# Patient Record
Sex: Female | Born: 1970 | Race: Black or African American | Hispanic: No | Smoking: Never smoker
Health system: Southern US, Community
[De-identification: ages and names within clinical notes are randomized; demographics above are authoritative.]

## PROBLEM LIST (undated history)

## (undated) DIAGNOSIS — I10 Essential (primary) hypertension: Secondary | ICD-10-CM

## (undated) DIAGNOSIS — M1990 Unspecified osteoarthritis, unspecified site: Secondary | ICD-10-CM

## (undated) DIAGNOSIS — L409 Psoriasis, unspecified: Secondary | ICD-10-CM

## (undated) DIAGNOSIS — E1140 Type 2 diabetes mellitus with diabetic neuropathy, unspecified: Secondary | ICD-10-CM

## (undated) DIAGNOSIS — M419 Scoliosis, unspecified: Secondary | ICD-10-CM

## (undated) DIAGNOSIS — L4050 Arthropathic psoriasis, unspecified: Secondary | ICD-10-CM

## (undated) DIAGNOSIS — M5430 Sciatica, unspecified side: Secondary | ICD-10-CM

## (undated) HISTORY — DX: Arthropathic psoriasis, unspecified: L40.50

## (undated) HISTORY — PX: TUBAL LIGATION: SHX77

## (undated) HISTORY — DX: Psoriasis, unspecified: L40.9

## (undated) HISTORY — DX: Sciatica, unspecified side: M54.30

---

## 1989-02-07 DIAGNOSIS — I10 Essential (primary) hypertension: Secondary | ICD-10-CM | POA: Insufficient documentation

## 1989-02-07 DIAGNOSIS — A6000 Herpesviral infection of urogenital system, unspecified: Secondary | ICD-10-CM | POA: Insufficient documentation

## 1989-02-07 DIAGNOSIS — E1142 Type 2 diabetes mellitus with diabetic polyneuropathy: Secondary | ICD-10-CM | POA: Insufficient documentation

## 1989-02-07 DIAGNOSIS — Z8619 Personal history of other infectious and parasitic diseases: Secondary | ICD-10-CM | POA: Insufficient documentation

## 1989-02-07 DIAGNOSIS — E785 Hyperlipidemia, unspecified: Secondary | ICD-10-CM | POA: Insufficient documentation

## 2006-02-07 DIAGNOSIS — H409 Unspecified glaucoma: Secondary | ICD-10-CM | POA: Insufficient documentation

## 2006-02-07 DIAGNOSIS — J309 Allergic rhinitis, unspecified: Secondary | ICD-10-CM | POA: Insufficient documentation

## 2007-04-23 ENCOUNTER — Emergency Department (HOSPITAL_COMMUNITY): Admission: EM | Admit: 2007-04-23 | Discharge: 2007-04-23 | Payer: Self-pay | Admitting: Emergency Medicine

## 2007-09-29 ENCOUNTER — Emergency Department (HOSPITAL_COMMUNITY): Admission: EM | Admit: 2007-09-29 | Discharge: 2007-09-29 | Payer: Self-pay | Admitting: Emergency Medicine

## 2008-03-16 ENCOUNTER — Emergency Department (HOSPITAL_COMMUNITY): Admission: EM | Admit: 2008-03-16 | Discharge: 2008-03-16 | Payer: Self-pay | Admitting: Emergency Medicine

## 2010-05-25 LAB — BASIC METABOLIC PANEL
Chloride: 100 mEq/L (ref 96–112)
GFR calc Af Amer: >60 mL/min (ref >60)
GFR calc non Af Amer: >60 mL/min (ref >60)
Potassium: 3.8 mEq/L (ref 3.5–5.1)
Sodium: 135 mEq/L (ref 135–145)

## 2010-05-25 LAB — GLUCOSE, CAPILLARY: Glucose-Capillary: 263 mg/dL — ABNORMAL HIGH (ref 70–99)

## 2010-12-06 ENCOUNTER — Emergency Department (HOSPITAL_COMMUNITY)
Admission: EM | Admit: 2010-12-06 | Discharge: 2010-12-06 | Disposition: A | Discharge status: Home / Self Care | Payer: Medicaid Other | Attending: Emergency Medicine | Admitting: Emergency Medicine

## 2010-12-06 DIAGNOSIS — E119 Type 2 diabetes mellitus without complications: Secondary | ICD-10-CM | POA: Insufficient documentation

## 2010-12-06 DIAGNOSIS — L02219 Cutaneous abscess of trunk, unspecified: Secondary | ICD-10-CM | POA: Insufficient documentation

## 2010-12-06 DIAGNOSIS — I10 Essential (primary) hypertension: Secondary | ICD-10-CM | POA: Insufficient documentation

## 2010-12-06 LAB — GLUCOSE, CAPILLARY: Glucose-Capillary: 435 mg/dL — ABNORMAL HIGH (ref 70–99)

## 2010-12-08 ENCOUNTER — Emergency Department (HOSPITAL_COMMUNITY)
Admission: EM | Admit: 2010-12-08 | Discharge: 2010-12-08 | Disposition: A | Discharge status: Home / Self Care | Payer: Medicaid Other | Attending: Emergency Medicine | Admitting: Emergency Medicine

## 2010-12-08 DIAGNOSIS — I2510 Atherosclerotic heart disease of native coronary artery without angina pectoris: Secondary | ICD-10-CM | POA: Insufficient documentation

## 2010-12-08 DIAGNOSIS — L02219 Cutaneous abscess of trunk, unspecified: Secondary | ICD-10-CM | POA: Insufficient documentation

## 2010-12-08 DIAGNOSIS — I10 Essential (primary) hypertension: Secondary | ICD-10-CM | POA: Insufficient documentation

## 2010-12-08 DIAGNOSIS — Z794 Long term (current) use of insulin: Secondary | ICD-10-CM | POA: Insufficient documentation

## 2010-12-08 DIAGNOSIS — E119 Type 2 diabetes mellitus without complications: Secondary | ICD-10-CM | POA: Insufficient documentation

## 2011-01-08 ENCOUNTER — Emergency Department (INDEPENDENT_AMBULATORY_CARE_PROVIDER_SITE_OTHER)
Admission: EM | Admit: 2011-01-08 | Discharge: 2011-01-08 | Disposition: A | Discharge status: Home / Self Care | Payer: Medicaid Other | Source: Home / Self Care | Attending: Family Medicine | Admitting: Family Medicine

## 2011-01-08 ENCOUNTER — Encounter: Payer: Self-pay | Admitting: Emergency Medicine

## 2011-01-08 ENCOUNTER — Observation Stay (HOSPITAL_COMMUNITY)
Admission: EM | Admit: 2011-01-08 | Discharge: 2011-01-09 | Disposition: A | Discharge status: Home / Self Care | Payer: Medicaid Other | Attending: Emergency Medicine | Admitting: Emergency Medicine

## 2011-01-08 DIAGNOSIS — E119 Type 2 diabetes mellitus without complications: Secondary | ICD-10-CM | POA: Insufficient documentation

## 2011-01-08 DIAGNOSIS — L03119 Cellulitis of unspecified part of limb: Secondary | ICD-10-CM | POA: Insufficient documentation

## 2011-01-08 DIAGNOSIS — L02419 Cutaneous abscess of limb, unspecified: Principal | ICD-10-CM | POA: Insufficient documentation

## 2011-01-08 DIAGNOSIS — L0390 Cellulitis, unspecified: Secondary | ICD-10-CM

## 2011-01-08 DIAGNOSIS — Z794 Long term (current) use of insulin: Secondary | ICD-10-CM | POA: Insufficient documentation

## 2011-01-08 DIAGNOSIS — L0291 Cutaneous abscess, unspecified: Secondary | ICD-10-CM

## 2011-01-08 DIAGNOSIS — IMO0002 Reserved for concepts with insufficient information to code with codable children: Secondary | ICD-10-CM

## 2011-01-08 HISTORY — DX: Unspecified osteoarthritis, unspecified site: M19.90

## 2011-01-08 HISTORY — DX: Type 2 diabetes mellitus with diabetic neuropathy, unspecified: E11.40

## 2011-01-08 HISTORY — DX: Essential (primary) hypertension: I10

## 2011-01-08 LAB — CBC
Hemoglobin: 12.2 g/dL (ref 12.0–15.0)
MCHC: 33.1 g/dL (ref 30.0–36.0)
RBC: 4.58 MIL/uL (ref 3.87–5.11)
WBC: 12.8 10*3/uL — ABNORMAL HIGH (ref 4.0–10.5)

## 2011-01-08 LAB — DIFFERENTIAL
Basophils Relative: 0 % (ref 0–1)
Lymphocytes Relative: 22 % (ref 12–46)
Monocytes Relative: 7 % (ref 3–12)
Neutro Abs: 9 10*3/uL — ABNORMAL HIGH (ref 1.7–7.7)
Neutrophils Relative %: 70 % (ref 43–77)

## 2011-01-08 LAB — POCT I-STAT, CHEM 8
BUN: 18 mg/dL (ref 6–23)
Chloride: 99 mEq/L (ref 96–112)
Glucose, Bld: 340 mg/dL — ABNORMAL HIGH (ref 70–99)
HCT: 38 % (ref 36.0–46.0)
Potassium: 3.8 mEq/L (ref 3.5–5.1)

## 2011-01-08 MED ORDER — ONDANSETRON HCL 4 MG/2ML IJ SOLN
4.0000 mg | Freq: Once | INTRAMUSCULAR | Status: AC
  Administered 2011-01-08: 4 mg via INTRAVENOUS
  Filled 2011-01-08: qty 2

## 2011-01-08 MED ORDER — HYDROMORPHONE HCL PF 1 MG/ML IJ SOLN
1.0000 mg | Freq: Once | INTRAMUSCULAR | Status: AC
  Administered 2011-01-08: 1 mg via INTRAVENOUS
  Filled 2011-01-08: qty 1

## 2011-01-08 MED ORDER — ONDANSETRON HCL 4 MG/2ML IJ SOLN
4.0000 mg | Freq: Four times a day (QID) | INTRAMUSCULAR | Status: DC | PRN
  Administered 2011-01-09: 4 mg via INTRAVENOUS
  Filled 2011-01-08: qty 2

## 2011-01-08 MED ORDER — SODIUM CHLORIDE 0.9 % IV SOLN
Freq: Once | INTRAVENOUS | Status: AC
  Administered 2011-01-08: 22:00:00 via INTRAVENOUS

## 2011-01-08 MED ORDER — CLINDAMYCIN PHOSPHATE 300 MG/50ML IV SOLN
300.0000 mg | Freq: Three times a day (TID) | INTRAVENOUS | Status: DC
  Administered 2011-01-09 (×3): 300 mg via INTRAVENOUS
  Filled 2011-01-08 (×6): qty 50

## 2011-01-08 MED ORDER — SODIUM CHLORIDE 0.9 % IV SOLN
20.0000 mL | INTRAVENOUS | Status: DC
  Administered 2011-01-09: 1000 mL via INTRAVENOUS

## 2011-01-08 NOTE — ED Provider Notes (Addendum)
History     CSN: [id number] Arrival date & time: 01/08/2011  8:53 PM   First MD Initiated Contact with Patient 01/08/11 2056      No chief complaint on file.   (Consider location/radiation/quality/duration/timing/severity/associated sxs/prior treatment) HPI Comments: Alexandria Meyer is an insulin-dependent diabetic, who, reports Alexandria Meyer's had flulike syndrome for the past week, and  been in the bad, Alexandria Meyer did notice slight discomfort of her anterior upper right thigh 5 or 6 days ago, yesterday, developed a fluctuant central area that ruptured and has been draining purulent matter since, the pain radiates to her groin into her lateral hip, low-grade fever or chills.  Alexandria Meyer went to urgent care who sent her directly to the emergency room to have the abscess, assessed and cared for.  Alexandria Meyer has not tried any compresses, warm or cold to the area.  Has not contacted her primary care Dr. has not checked her blood sugars in this week.  Due to her being ill with the flu.   The history is provided by the patient.    Past Medical History  Diagnosis Date  . Diabetes mellitus   . Hypertension   . Osteoarthritis   . Neuropathy in diabetes     Past Surgical History  Procedure Date  . Cesarean section   . Tubal ligation     No family history on file.  History  Substance Use Topics  . Smoking status: Never Smoker   . Smokeless tobacco: Not on file  . Alcohol Use: No    OB History    Grav Para Term Preterm Abortions TAB SAB Ect Mult Living                  Review of Systems  Constitutional: Positive for fever, chills and activity change.  HENT: Negative.   Eyes: Negative.   Respiratory: Negative for cough and shortness of breath.   Cardiovascular: Negative.   Gastrointestinal: Negative.   Genitourinary: Negative for dysuria and frequency.  Musculoskeletal: Positive for myalgias.  Skin: Positive for wound.  Neurological: Negative.   Hematological: Negative.   Psychiatric/Behavioral: Negative.      Allergies  Penicillins  Home Medications   Current Outpatient Rx  Name Route Sig Dispense Refill  . CYCLOBENZAPRINE HCL PO Oral Take by mouth.      Marland Kitchen GABAPENTIN (PHN) PO Oral Take by mouth.      . INSULIN ASPART 100 UNIT/ML [place] SOLN Subcutaneous Inject into the skin 3 (three) times daily before meals. Sliding scale     . LANTUS SOLOSTAR [place] Subcutaneous Inject into the skin.      Marland Kitchen LISINOPRIL PO Oral Take by mouth.      . METOPROLOL TARTRATE PO Oral Take by mouth.      . TRIPLEX DM PO Oral Take by mouth.      Marland Kitchen PRAMLINTIDE ACETATE 600 MCG/ML [place] SOLN Subcutaneous Inject 60 mcg into the skin 3 (three) times daily before meals.      Marland Kitchen SIMVASTATIN 10 MG PO TABS Oral Take 10 mg by mouth at bedtime.        BP 126/83  Pulse 118  Temp(Src) 99.3 F (37.4 C) (Oral)  Resp 19  SpO2 97%  LMP 01/08/2011  Physical Exam  Constitutional: Alexandria Meyer is oriented to person, place, and time. Alexandria Meyer appears well-developed and well-nourished.  HENT:  Head: Normocephalic.  Eyes: EOM are normal.  Neck: Neck supple.  Cardiovascular: Regular rhythm.  Tachycardia present.   Pulmonary/Chest: Effort normal.  Abdominal: Soft.  Neurological: Alexandria Meyer is oriented to person, place, and time.  Skin: Skin is warm. Lesion noted. No rash noted. There is erythema.     Psychiatric: Alexandria Meyer has a normal mood and affect.    ED Course  INCISION AND DRAINAGE Date/Time: 01/09/2011 12:59 AM Performed by: Arman Filter Authorized by: Arman Filter Consent: Verbal consent obtained. Consent given by: patient Patient understanding: patient states understanding of the procedure being performed Site marked: the operative site was not marked Patient identity confirmed: verbally with patient Time out: Immediately prior to procedure a "time out" was called to verify the correct patient, procedure, equipment, support staff and site/side marked as required. Type: abscess Body area: lower extremity Anesthesia: local  infiltration Local anesthetic: lidocaine 1% with epinephrine Anesthetic total: 2 ml Patient sedated: no Scalpel size: 11 Complexity: simple Drainage: purulent Wound treatment: drain placed Packing material: 1/4 in iodoform gauze Patient tolerance: Patient tolerated the procedure well with no immediate complications.  INCISION AND DRAINAGE Date/Time: 01/09/2011 6:11 AM Performed by: Arman Filter Authorized by: Arman Filter Consent: Verbal consent obtained. Consent given by: patient Site marked: the operative site was not marked Patient identity confirmed: verbally with patient Time out: Immediately prior to procedure a "time out" was called to verify the correct patient, procedure, equipment, support staff and site/side marked as required. Body area: lower extremity Anesthesia: local infiltration Local anesthetic: lidocaine 1% with epinephrine Anesthetic total: 3 ml Patient sedated: no Scalpel size: 11 Needle gauge: 22 Incision type: single straight Complexity: simple Drainage: purulent Wound treatment: drain placed Packing material: 1/2 in iodoform gauze Patient tolerance: Patient tolerated the procedure well with no immediate complications.   (including critical care time)   Labs Reviewed  CBC  DIFFERENTIAL  I-STAT, CHEM 8  WOUND CULTURE   No results found.   No diagnosis found.    MDM  Will dilate this patient's CBC and electrolytes status particularly her glycemic levels, though I and D. abscess, provide pain control and antibiotics        Arman Filter, NP 01/09/11 0102  Arman Filter, NP 01/09/11 [phone]

## 2011-01-08 NOTE — ED Provider Notes (Signed)
History     CSN: [id number] Arrival date & time: 01/08/2011  6:35 PM   First MD Initiated Contact with Patient 01/08/11 1733      Chief Complaint  Patient presents with  . Abscess    (Consider location/radiation/quality/duration/timing/severity/associated sxs/prior treatment) HPI Comments: Wendell presents for evaluation of a painful abscess on her RIGHT thigh, over the last week. She reports a hx of these and a hx of poorly controlled diabetes.   Patient is a 40 y.o. female presenting with abscess. The history is provided by the patient.  Abscess  This is a new problem. The current episode started less than one week ago. The onset was sudden. The problem occurs rarely. The problem has been rapidly worsening. The abscess is present on the right upper leg. The problem is severe. The abscess is characterized by painfulness, burning, swelling and redness.    History reviewed. No pertinent past medical history.  History reviewed. No pertinent past surgical history.  No family history on file.  History  Substance Use Topics  . Smoking status: Not on file  . Smokeless tobacco: Not on file  . Alcohol Use: Not on file    OB History    Grav Para Term Preterm Abortions TAB SAB Ect Mult Living                  Review of Systems  Constitutional: Negative.   HENT: Negative.   Eyes: Negative.   Respiratory: Negative.   Cardiovascular: Negative.   Gastrointestinal: Negative.   Genitourinary: Negative.   Musculoskeletal: Negative.   Skin: Positive for color change and wound.  Neurological: Negative.     Allergies  Penicillins  Home Medications   Current Outpatient Rx  Name Route Sig Dispense Refill  . CYCLOBENZAPRINE HCL PO Oral Take by mouth.      . INSULIN ASPART 100 UNIT/ML [place] SOLN Subcutaneous Inject into the skin 3 (three) times daily before meals. Sliding scale     . LANTUS SOLOSTAR [place] Subcutaneous Inject into the skin.      Marland Kitchen LISINOPRIL PO Oral Take by mouth.       . METOPROLOL TARTRATE PO Oral Take by mouth.      . TRIPLEX DM PO Oral Take by mouth.      Marland Kitchen PRAMLINTIDE ACETATE 600 MCG/ML [place] SOLN Subcutaneous Inject 60 mcg into the skin 3 (three) times daily before meals.      Marland Kitchen SIMVASTATIN 10 MG PO TABS Oral Take 10 mg by mouth at bedtime.        There were no vitals taken for this visit.  Physical Exam  Nursing note and vitals reviewed. Constitutional: She is oriented to person, place, and time. She appears well-developed and well-nourished.  HENT:  Head: Normocephalic and atraumatic.  Eyes: EOM are normal.  Neck: Normal range of motion.  Neurological: She is alert and oriented to person, place, and time.  Skin: Skin is warm and dry. Lesion and rash noted. Rash is pustular.       ED Course  Procedures (including critical care time)  Labs Reviewed - No data to display No results found.   No diagnosis found.    MDM  Abscess, sent to ED        Richardo Priest, MD 01/08/11 1950

## 2011-01-08 NOTE — ED Notes (Signed)
Right thigh, anterior with large area that is red, warm to touch.  Very painful to touch.  Reports soreness into right lower abdomen.  Reports skin super sensitive to materials/clothing touching area.  Noticed 2 days ago.

## 2011-01-09 ENCOUNTER — Encounter (HOSPITAL_COMMUNITY): Payer: Self-pay

## 2011-01-09 LAB — GLUCOSE, CAPILLARY
Glucose-Capillary: 349 mg/dL — ABNORMAL HIGH (ref 70–99)
Glucose-Capillary: 337 mg/dL — ABNORMAL HIGH (ref 70–99)
Glucose-Capillary: 277 mg/dL — ABNORMAL HIGH (ref 70–99)

## 2011-01-09 MED ORDER — CLINDAMYCIN HCL 150 MG PO CAPS: 450.0000 mg | ORAL_CAPSULE | Freq: Three times a day (TID) | ORAL | Status: AC

## 2011-01-09 MED ORDER — ONDANSETRON HCL 4 MG/2ML IJ SOLN
4.0000 mg | Freq: Once | INTRAMUSCULAR | Status: AC
  Administered 2011-01-09: 4 mg via INTRAVENOUS
  Filled 2011-01-09: qty 2

## 2011-01-09 MED ORDER — INSULIN ASPART 100 UNIT/ML ~~LOC~~ SOLN
10.0000 [IU] | Freq: Once | SUBCUTANEOUS | Status: AC
  Administered 2011-01-09: 10 [IU] via INTRAVENOUS
  Filled 2011-01-09 (×2): qty 1

## 2011-01-09 MED ORDER — HYDROMORPHONE HCL PF 1 MG/ML IJ SOLN
1.0000 mg | Freq: Once | INTRAMUSCULAR | Status: AC
  Administered 2011-01-09: 1 mg via INTRAVENOUS
  Filled 2011-01-09: qty 1

## 2011-01-09 MED ORDER — DIPHENHYDRAMINE HCL 50 MG/ML IJ SOLN
12.5000 mg | Freq: Once | INTRAMUSCULAR | Status: AC
  Administered 2011-01-09: 12.5 mg via INTRAVENOUS
  Filled 2011-01-09: qty 1

## 2011-01-09 MED ORDER — OXYCODONE-ACETAMINOPHEN 5-325 MG PO TABS: 1.0000 | ORAL_TABLET | Freq: Four times a day (QID) | ORAL | Status: AC | PRN

## 2011-01-09 MED ORDER — HYDROXYZINE HCL 25 MG PO TABS
25.0000 mg | ORAL_TABLET | Freq: Once | ORAL | Status: AC
  Administered 2011-01-09: 25 mg via ORAL
  Filled 2011-01-09: qty 1

## 2011-01-09 MED ORDER — OXYCODONE-ACETAMINOPHEN 5-325 MG PO TABS
2.0000 | ORAL_TABLET | Freq: Once | ORAL | Status: AC
  Administered 2011-01-09: 2 via ORAL
  Filled 2011-01-09: qty 2

## 2011-01-09 MED ORDER — HYDROMORPHONE HCL PF 1 MG/ML IJ SOLN
INTRAMUSCULAR | Status: AC
  Administered 2011-01-09: 1 mg via INTRAMUSCULAR
  Filled 2011-01-09: qty 1

## 2011-01-09 MED ORDER — DIPHENHYDRAMINE HCL 50 MG/ML IJ SOLN
INTRAMUSCULAR | Status: AC
  Filled 2011-01-09: qty 1

## 2011-01-09 MED ORDER — DIPHENHYDRAMINE HCL 50 MG/ML IJ SOLN
12.5000 mg | Freq: Once | INTRAMUSCULAR | Status: AC
  Administered 2011-01-09: 12.5 mg via INTRAVENOUS

## 2011-01-09 NOTE — ED Notes (Signed)
Pt visiting, laughing w/family.

## 2011-01-09 NOTE — ED Notes (Signed)
Medical screening examination/treatment/procedure(s) were conducted as a shared visit with non-physician practitioner(s) and myself.  I personally evaluated the patient during the encounter  R thigh erythema, warmth fluctuance.  Cellulitis of 9 cm with central abscess.   Plan overnight IV antibiotics and likely discharge in morning with bactrim/keflex.  Recheck 48 hours with PCP.  Glynn Octave, MD 01/09/11 [phone]

## 2011-01-09 NOTE — ED Provider Notes (Signed)
4:30 PM Patient's care in the CDU has been assumed by myself. She is currently receiving a dose of IV clindamycin for right upper thigh cellulitis secondary to an abscess. She also has poorly controlled diabetes. She is well appearing, pleasant and complains only of moderate pain at this time. I have examined the area of cellulitis and there is no extension of erythema or tenderness beyond the previously drawn borders. I will order an additional dose of pain medication with a plan to discharge the patient home after completion of her antibiotic. She has informed me that she will be establishing care with a primary Dr. tomorrow with a previously scheduled appointment at 3:30 PM. She and I have discussed that is very important for her to maintain this appointment for both a recheck of her wound and for development of a long-term plan for diabetes control.  7:05 PM Patient has completed her dose of clindamycin. There is still no extension of erythema beyond previously drawn margins. There is a small amount of purulent drainage from the I&D site; I have placed a new dressing over this. The patient does have a complaint of itching secondary to the Percocet; I have ordered a small dose of Benadryl to assist with this. The patient appears stable for discharge and is comfortable with a plan of outpatient antibiotics with primary care followup tomorrow and as needed beyond that point.   Elwyn Reach [address], [place] 01/09/11 1911

## 2011-01-09 NOTE — ED Provider Notes (Signed)
Medical screening examination/treatment/procedure(s) were conducted as a shared visit with non-physician practitioner(s) and myself.  I personally evaluated the patient during the encounter  Large area of cellulitis with central abscess.  I+D, IV antibiotics.  Glynn Octave, MD 01/09/11 [phone]

## 2011-01-09 NOTE — ED Provider Notes (Signed)
Medical screening examination/treatment/procedure(s) were conducted as a shared visit with non-physician practitioner(s) and myself.  I personally evaluated the patient during the encounter  Hx DM, R thigh abscess with surrounding cellulitis.  NV intact.  No fevers.  Glynn Octave, MD 01/09/11 1010

## 2011-01-09 NOTE — ED Notes (Signed)
Meal tray ordered from service response center

## 2011-01-09 NOTE — ED Notes (Signed)
0/9% NaCl scheduled for 2245 - duplicate order per Aura Camps, RN, night RN

## 2011-01-09 NOTE — ED Notes (Signed)
Called lab to inquire if wound culture received - advised no.

## 2011-01-10 NOTE — ED Provider Notes (Signed)
Medical screening examination/treatment/procedure(s) were performed by non-physician practitioner and as supervising physician I was immediately available for consultation/collaboration.   Glynn Octave, MD 01/10/11 1128

## 2011-05-06 ENCOUNTER — Other Ambulatory Visit: Payer: Self-pay | Admitting: Internal Medicine

## 2011-05-06 DIAGNOSIS — Z1231 Encounter for screening mammogram for malignant neoplasm of breast: Secondary | ICD-10-CM

## 2011-05-19 ENCOUNTER — Ambulatory Visit: Payer: Medicaid Other

## 2011-09-04 ENCOUNTER — Emergency Department (HOSPITAL_COMMUNITY)
Admission: EM | Admit: 2011-09-04 | Discharge: 2011-09-04 | Disposition: A | Discharge status: Home / Self Care | Payer: Medicaid Other | Attending: Emergency Medicine | Admitting: Emergency Medicine

## 2011-09-04 ENCOUNTER — Encounter (HOSPITAL_COMMUNITY): Payer: Self-pay | Admitting: Emergency Medicine

## 2011-09-04 ENCOUNTER — Emergency Department (HOSPITAL_COMMUNITY)
Admission: EM | Admit: 2011-09-04 | Discharge: 2011-09-04 | Disposition: A | Discharge status: Nursing Home (Includes ICF) | Payer: Medicaid Other | Source: Home / Self Care | Attending: Emergency Medicine | Admitting: Emergency Medicine

## 2011-09-04 ENCOUNTER — Encounter (HOSPITAL_COMMUNITY): Payer: Self-pay | Admitting: *Deleted

## 2011-09-04 DIAGNOSIS — R109 Unspecified abdominal pain: Secondary | ICD-10-CM

## 2011-09-04 DIAGNOSIS — S39012A Strain of muscle, fascia and tendon of lower back, initial encounter: Secondary | ICD-10-CM

## 2011-09-04 DIAGNOSIS — B9689 Other specified bacterial agents as the cause of diseases classified elsewhere: Secondary | ICD-10-CM | POA: Insufficient documentation

## 2011-09-04 DIAGNOSIS — M1990 Unspecified osteoarthritis, unspecified site: Secondary | ICD-10-CM | POA: Insufficient documentation

## 2011-09-04 DIAGNOSIS — X58XXXA Exposure to other specified factors, initial encounter: Secondary | ICD-10-CM | POA: Insufficient documentation

## 2011-09-04 DIAGNOSIS — I10 Essential (primary) hypertension: Secondary | ICD-10-CM | POA: Insufficient documentation

## 2011-09-04 DIAGNOSIS — Z79899 Other long term (current) drug therapy: Secondary | ICD-10-CM | POA: Insufficient documentation

## 2011-09-04 DIAGNOSIS — M549 Dorsalgia, unspecified: Secondary | ICD-10-CM

## 2011-09-04 DIAGNOSIS — Y93B9 Activity, other involving muscle strengthening exercises: Secondary | ICD-10-CM | POA: Insufficient documentation

## 2011-09-04 DIAGNOSIS — E119 Type 2 diabetes mellitus without complications: Secondary | ICD-10-CM | POA: Insufficient documentation

## 2011-09-04 DIAGNOSIS — IMO0002 Reserved for concepts with insufficient information to code with codable children: Secondary | ICD-10-CM | POA: Insufficient documentation

## 2011-09-04 DIAGNOSIS — Z794 Long term (current) use of insulin: Secondary | ICD-10-CM | POA: Insufficient documentation

## 2011-09-04 DIAGNOSIS — A499 Bacterial infection, unspecified: Secondary | ICD-10-CM | POA: Insufficient documentation

## 2011-09-04 DIAGNOSIS — Y998 Other external cause status: Secondary | ICD-10-CM | POA: Insufficient documentation

## 2011-09-04 DIAGNOSIS — N760 Acute vaginitis: Secondary | ICD-10-CM | POA: Insufficient documentation

## 2011-09-04 LAB — COMPREHENSIVE METABOLIC PANEL
AST: 14 U/L (ref 0–37)
BUN: 18 mg/dL (ref 6–23)
CO2: 28 mEq/L (ref 19–32)
Calcium: 9.7 mg/dL (ref 8.4–10.5)
Creatinine, Ser: 0.61 mg/dL (ref 0.50–1.10)
GFR calc Af Amer: >90 mL/min (ref >90)
GFR calc non Af Amer: >90 mL/min (ref >90)
Glucose, Bld: 274 mg/dL — ABNORMAL HIGH (ref 70–99)

## 2011-09-04 LAB — WET PREP, GENITAL: Clue Cells Wet Prep HPF POC: NONE SEEN

## 2011-09-04 LAB — URINE MICROSCOPIC-ADD ON

## 2011-09-04 LAB — POCT URINALYSIS DIP (DEVICE)
Ketones, ur: NEGATIVE mg/dL
Leukocytes, UA: NEGATIVE
Nitrite: NEGATIVE
Protein, ur: NEGATIVE mg/dL
pH: 6 (ref 5.0–8.0)

## 2011-09-04 LAB — CBC
HCT: 38.9 % (ref 36.0–46.0)
MCH: 26.7 pg (ref 26.0–34.0)
MCV: 81.7 fL (ref 78.0–100.0)
Platelets: 232 10*3/uL (ref 150–400)
RBC: 4.76 MIL/uL (ref 3.87–5.11)

## 2011-09-04 LAB — URINALYSIS, ROUTINE W REFLEX MICROSCOPIC
Bilirubin Urine: NEGATIVE
Hgb urine dipstick: NEGATIVE
Specific Gravity, Urine: 1.028 (ref 1.005–1.030)
Urobilinogen, UA: 0.2 mg/dL (ref 0.0–1.0)

## 2011-09-04 MED ORDER — SODIUM CHLORIDE 0.9 % IV BOLUS (SEPSIS)
500.0000 mL | Freq: Once | INTRAVENOUS | Status: AC
  Administered 2011-09-04: 500 mL via INTRAVENOUS

## 2011-09-04 MED ORDER — OXYCODONE-ACETAMINOPHEN 5-325 MG PO TABS: 1.0000 | ORAL_TABLET | Freq: Four times a day (QID) | ORAL | Status: AC | PRN

## 2011-09-04 MED ORDER — CYCLOBENZAPRINE HCL 10 MG PO TABS: 10.0000 mg | ORAL_TABLET | Freq: Two times a day (BID) | ORAL | Status: AC | PRN

## 2011-09-04 MED ORDER — METRONIDAZOLE 500 MG PO TABS: 500.0000 mg | ORAL_TABLET | Freq: Two times a day (BID) | ORAL | Status: AC

## 2011-09-04 MED ORDER — MORPHINE SULFATE 4 MG/ML IJ SOLN
4.0000 mg | Freq: Once | INTRAMUSCULAR | Status: AC
  Administered 2011-09-04: 4 mg via INTRAVENOUS
  Filled 2011-09-04: qty 1

## 2011-09-04 MED ORDER — DIPHENHYDRAMINE HCL 50 MG/ML IJ SOLN
25.0000 mg | Freq: Once | INTRAMUSCULAR | Status: AC
  Administered 2011-09-04: 25 mg via INTRAVENOUS
  Filled 2011-09-04: qty 1

## 2011-09-04 MED ORDER — ONDANSETRON HCL 4 MG/2ML IJ SOLN
4.0000 mg | Freq: Once | INTRAMUSCULAR | Status: AC
  Administered 2011-09-04: 4 mg via INTRAVENOUS
  Filled 2011-09-04: qty 2

## 2011-09-04 MED ORDER — HYDROMORPHONE HCL PF 1 MG/ML IJ SOLN
1.0000 mg | Freq: Once | INTRAMUSCULAR | Status: AC
  Administered 2011-09-04: 1 mg via INTRAVENOUS
  Filled 2011-09-04: qty 1

## 2011-09-04 NOTE — ED Provider Notes (Signed)
History     CSN: [id number]  Arrival date & time 09/04/11  1355   First MD Initiated Contact with Patient 09/04/11 1718      Chief Complaint  Patient presents with  . Abdominal Pain  . Back Pain    (Consider location/radiation/quality/duration/timing/severity/associated sxs/prior treatment) HPI Patient to ER complaining of severe back pain and lower abdominal pain. She described that Wednesday after she was exercising she walked and then perform some exercises at home she heard a loud pop coming from the right side of her back but feels this is a deep pain. She also describes that the pain radiates to worse her abdomen . Patient denies any fevers, nausea or vomiting. Or any falls, patient denies any urinary symptoms but does describe a clear vaginal discharge for several days This is a new problem. The current episode started more than 2 days ago. The problem occurs constantly. The problem has been gradually worsening. The quality of the pain is described as shooting and aching. The pain is at a severity of 8/10. The pain is moderate. The symptoms are aggravated by bending, twisting and certain positions. Associated symptoms include abdominal pain. Pertinent negatives include no fever and no dysuria.    The patient informs me that she was treated for PID 3 weeks ago and that she had not been exercising in years and last Wednesday was her first time in a long time and it was quite strenuous.   Past Medical History  Diagnosis Date  . Diabetes mellitus   . Hypertension   . Osteoarthritis   . Neuropathy in diabetes     Past Surgical History  Procedure Date  . Cesarean section   . Tubal ligation     History reviewed. No pertinent family history.  History  Substance Use Topics  . Smoking status: Never Smoker   . Smokeless tobacco: Not on file  . Alcohol Use: No    OB History    Grav Para Term Preterm Abortions TAB SAB Ect Mult Living                  Review of  Systems   HEENT: denies blurry vision or change in hearing PULMONARY: Denies difficulty breathing and SOB CARDIAC: denies chest pain or heart palpitations MUSCULOSKELETAL:  denies being unable to ambulate GU: denies loss of bowel or urinary control NEURO: denies numbness and tingling in extremities SKIN: no new rashes PSYCH: patient denies anxiety or depression. NECK: Pt denies having neck pain     Allergies  Penicillins  Home Medications   Current Outpatient Rx  Name Route Sig Dispense Refill  . ACYCLOVIR 400 MG PO TABS Oral Take 400 mg by mouth daily.      . CHOLINE FENOFIBRATE 135 MG PO CPDR Oral Take 135 mg by mouth daily.    . CYCLOBENZAPRINE HCL 10 MG PO TABS Oral Take 10 mg by mouth at bedtime.     Marland Kitchen GABAPENTIN 300 MG PO CAPS Oral Take 300 mg by mouth 3 (three) times daily.      . INSULIN ASPART 100 UNIT/ML [place] SOLN Subcutaneous Inject 15 Units into the skin 3 (three) times daily before meals. Sliding scale    . LANTUS SOLOSTAR [place] Subcutaneous Inject 60 Units into the skin 2 (two) times daily.     Marland Kitchen LISINOPRIL-HYDROCHLOROTHIAZIDE 10-12.5 MG PO TABS Oral Take 1 tablet by mouth daily.      Marland Kitchen METOPROLOL TARTRATE 25 MG PO TABS Oral Take 25 mg by  mouth daily.      Marland Kitchen SIMVASTATIN 40 MG PO TABS Oral Take 40 mg by mouth daily.        BP 108/55  Pulse 94  Temp 98.3 F (36.8 C) (Oral)  Resp 18  SpO2 99%  LMP 08/08/2011  Physical Exam  Nursing note and vitals reviewed. Constitutional: She appears well-developed and well-nourished. No distress.  HENT:  Head: Normocephalic and atraumatic.  Eyes: Pupils are equal, round, and reactive to light.  Neck: Normal range of motion. Neck supple.  Cardiovascular: Normal rate and regular rhythm.   Pulmonary/Chest: Effort normal.  Abdominal: Soft. There is tenderness in the suprapubic area. There is no rigidity, no rebound, no guarding, no tenderness at McBurney's point and negative Murphy's sign.  Genitourinary: Uterus normal. Cervix  exhibits discharge. Cervix exhibits no motion tenderness and no friability. Right adnexum displays mass and tenderness. Right adnexum displays no fullness. Left adnexum displays tenderness. Left adnexum displays no fullness. There is tenderness around the vagina. Vaginal discharge found.  Musculoskeletal:       Back:        Equal strength to bilateral lower extremities. Neurosensory  function adequate to both legs. Skin color is normal. Skin is warm and moist. I see no step off deformity, no bony tenderness. Pt is able to ambulate without limp. Pain is relieved when sitting in certain positions. ROM is decreased due to pain. No crepitus, laceration, effusion, swelling.  Pulses are normal   Neurological: She is alert.  Skin: Skin is warm and dry.    ED Course  Procedures (including critical care time)  Labs Reviewed  URINALYSIS, ROUTINE W REFLEX MICROSCOPIC - Abnormal; Notable for the following:    Glucose, UA >1000 (*)     All other components within normal limits  COMPREHENSIVE METABOLIC PANEL - Abnormal; Notable for the following:    Glucose, Bld 274 (*)     Total Bilirubin 0.2 (*)     All other components within normal limits  URINE MICROSCOPIC-ADD ON - Abnormal; Notable for the following:    Bacteria, UA FEW (*)     All other components within normal limits  WET PREP, GENITAL - Abnormal; Notable for the following:    Yeast Wet Prep HPF POC FEW (*)     WBC, Wet Prep HPF POC MODERATE (*)     All other components within normal limits  CBC  LIPASE, BLOOD  GC/CHLAMYDIA PROBE AMP, GENITAL   No results found.   1. Back strain   2. Bacterial vaginosis       MDM  pts back pain most likely musculoskeletal. It responded well to pain medication. Pt labs also positive for bacterial vaginosis.   Due to patients complexity i have told her how important it is that she follow-up with her PCP tomorrow or Tuesday that latest.  Her abdomen prior to discharge was a non acute abdomen. Pt  given Rx for metronidazole, muscle relaxer's and pain medication.  Patient with back pain. No neurological deficits. Patient is ambulatory. No warning symptoms of back pain including: loss of bowel or bladder control, night sweats, waking from sleep with back pain, unexplained fevers or weight loss, h/o cancer, IVDU, recent trauma. No concern for cauda equina, epidural abscess, or other serious cause of back pain. Conservative measures such as rest, ice/heat and pain medicine indicated with PCP follow-up if no improvement with conservative management.         Dorthula Matas, PA 09/04/11 1941

## 2011-09-04 NOTE — ED Notes (Signed)
Pt sent here from [facility], having lower back pain that radiates to lower abd. Having vaginal discharge, denies any n/v/d.

## 2011-09-04 NOTE — ED Provider Notes (Signed)
History     CSN: [id number]  Arrival date & time 09/04/11  1140   First MD Initiated Contact with Patient 09/04/11 1204      Chief Complaint  Patient presents with  . Back Pain    (Consider location/radiation/quality/duration/timing/severity/associated sxs/prior treatment) HPI Comments: Patient presents to urgent care this afternoon complaining of severe back pain that radiates to her right abdomen. She described that Wednesday after she was exercising she walked and then perform some exercises at home she heard a loud pop coming from the right side of her back but feels this is a deep pain. She also describes that the pain radiates to worse her abdomen (points to bilateral lower abdomen and right upper quadrant regions). Patient denies any fevers, nausea or vomiting. Or any falls, patient denies any urinary symptoms but does describe a clear vaginal discharge for several days.  Patient is a 41 y.o. female presenting with back pain. The history is provided by the patient.  Back Pain  This is a new problem. The current episode started more than 2 days ago. The problem occurs constantly. The problem has been gradually worsening. The quality of the pain is described as shooting and aching. The pain is at a severity of 8/10. The pain is moderate. The symptoms are aggravated by bending, twisting and certain positions. Associated symptoms include abdominal pain. Pertinent negatives include no fever and no dysuria.    Past Medical History  Diagnosis Date  . Diabetes mellitus   . Hypertension   . Osteoarthritis   . Neuropathy in diabetes     Past Surgical History  Procedure Date  . Cesarean section   . Tubal ligation     History reviewed. No pertinent family history.  History  Substance Use Topics  . Smoking status: Never Smoker   . Smokeless tobacco: Not on file  . Alcohol Use: No    OB History    Grav Para Term Preterm Abortions TAB SAB Ect Mult Living                   Review of Systems  Constitutional: Positive for activity change. Negative for fever, chills, diaphoresis, appetite change and fatigue.  Gastrointestinal: Positive for abdominal pain. Negative for nausea, vomiting, diarrhea, constipation, blood in stool and rectal pain.  Genitourinary: Positive for vaginal discharge. Negative for dysuria, hematuria, vaginal pain and menstrual problem.  Musculoskeletal: Positive for back pain. Negative for joint swelling and gait problem.  Skin: Negative for rash and wound.    Allergies  Penicillins  Home Medications   Current Outpatient Rx  Name Route Sig Dispense Refill  . ACYCLOVIR 400 MG PO TABS Oral Take 400 mg by mouth daily.      . TRILIPIX PO Oral Take 135 mg by mouth daily.     . CYCLOBENZAPRINE HCL 10 MG PO TABS Oral Take 10 mg by mouth 3 (three) times daily as needed. As needed for muscle spasms.     Marland Kitchen GABAPENTIN 300 MG PO CAPS Oral Take 300 mg by mouth 3 (three) times daily.      . INSULIN ASPART 100 UNIT/ML [place] SOLN Subcutaneous Inject 6-22 Units into the skin 3 (three) times daily before meals. Sliding scale    . LANTUS SOLOSTAR [place] Subcutaneous Inject 50 Units into the skin at bedtime.     Marland Kitchen LISINOPRIL-HYDROCHLOROTHIAZIDE 10-12.5 MG PO TABS Oral Take 1 tablet by mouth daily.      Marland Kitchen METOPROLOL TARTRATE 25 MG PO TABS Oral  Take 25 mg by mouth daily.      Marland Kitchen PRAMLINTIDE ACETATE 600 MCG/ML [place] SOLN Subcutaneous Inject 120 mcg into the skin 2 (two) times daily.     Marland Kitchen SIMVASTATIN 40 MG PO TABS Oral Take 40 mg by mouth daily.        BP 116/78  Pulse 97  Temp 98.6 F (37 C) (Oral)  Resp 19  SpO2 98%  LMP 08/08/2011  Physical Exam  Nursing note and vitals reviewed. Constitutional: She appears well-developed. She appears distressed.  HENT:  Head: Atraumatic.  Eyes: Conjunctivae are normal.  Neck: Neck supple.  Abdominal: Soft. Bowel sounds are normal. There is no hepatosplenomegaly. There is tenderness in the right lower quadrant,  periumbilical area and left lower quadrant. There is rebound. There is no rigidity, no guarding and no CVA tenderness.    Skin:       ED Course  Procedures (including critical care time)  Labs Reviewed - No data to display No results found.   1. Abdominal pain   2. Back pain       MDM  Patient presents with some atypical symptoms for a lumbar sprain. Her abdominal exam was somewhat disproportionate and reactive with moderate to severe pain with mild palpation to both lower quadrants and right periumbilical and upper abdominal regions. I cannot rule out an intra-abdominal condition with such exam. Patient is hemodynamically stable, afebrile with no vomiting or nausea.        Jimmie Molly, MD 09/04/11 1312

## 2011-09-04 NOTE — ED Notes (Signed)
Pt having severe back and abdominal pain since last wed. She states her pain is a 10/10 and hurts worse with movement. She denies any known injury. Pt lower abdomen has extreme tenderness on palpation.

## 2011-09-05 LAB — GC/CHLAMYDIA PROBE AMP, GENITAL: Chlamydia, DNA Probe: NEGATIVE

## 2011-09-05 NOTE — ED Provider Notes (Signed)
Medical screening examination/treatment/procedure(s) were performed by non-physician practitioner and as supervising physician I was immediately available for consultation/collaboration.   Carleene Cooper III, MD 09/05/11 1350

## 2012-02-08 DIAGNOSIS — M5136 Other intervertebral disc degeneration, lumbar region: Secondary | ICD-10-CM | POA: Insufficient documentation

## 2012-02-08 DIAGNOSIS — M1990 Unspecified osteoarthritis, unspecified site: Secondary | ICD-10-CM | POA: Insufficient documentation

## 2012-02-08 HISTORY — PX: BACK SURGERY: SHX140

## 2013-04-25 DIAGNOSIS — E1149 Type 2 diabetes mellitus with other diabetic neurological complication: Secondary | ICD-10-CM | POA: Insufficient documentation

## 2013-11-08 DIAGNOSIS — G8929 Other chronic pain: Secondary | ICD-10-CM | POA: Insufficient documentation

## 2013-11-08 DIAGNOSIS — M545 Low back pain, unspecified: Secondary | ICD-10-CM | POA: Insufficient documentation

## 2015-02-08 DIAGNOSIS — J45909 Unspecified asthma, uncomplicated: Secondary | ICD-10-CM | POA: Insufficient documentation

## 2016-06-16 ENCOUNTER — Emergency Department (HOSPITAL_COMMUNITY): Payer: Medicaid Other

## 2016-06-16 ENCOUNTER — Encounter (HOSPITAL_COMMUNITY): Payer: Self-pay | Admitting: *Deleted

## 2016-06-16 ENCOUNTER — Emergency Department (HOSPITAL_COMMUNITY)
Admission: EM | Admit: 2016-06-16 | Discharge: 2016-06-16 | Disposition: A | Discharge status: Home / Self Care | Payer: Medicaid Other | Attending: Emergency Medicine | Admitting: Emergency Medicine

## 2016-06-16 DIAGNOSIS — R52 Pain, unspecified: Secondary | ICD-10-CM

## 2016-06-16 DIAGNOSIS — M5416 Radiculopathy, lumbar region: Secondary | ICD-10-CM | POA: Insufficient documentation

## 2016-06-16 DIAGNOSIS — I10 Essential (primary) hypertension: Secondary | ICD-10-CM | POA: Insufficient documentation

## 2016-06-16 DIAGNOSIS — Z794 Long term (current) use of insulin: Secondary | ICD-10-CM | POA: Diagnosis not present

## 2016-06-16 DIAGNOSIS — E119 Type 2 diabetes mellitus without complications: Secondary | ICD-10-CM | POA: Diagnosis not present

## 2016-06-16 DIAGNOSIS — R200 Anesthesia of skin: Secondary | ICD-10-CM | POA: Diagnosis present

## 2016-06-16 DIAGNOSIS — M5410 Radiculopathy, site unspecified: Secondary | ICD-10-CM

## 2016-06-16 DIAGNOSIS — Z79899 Other long term (current) drug therapy: Secondary | ICD-10-CM | POA: Diagnosis not present

## 2016-06-16 LAB — BASIC METABOLIC PANEL
Anion gap: 11 (ref 5–15)
BUN: 16 mg/dL (ref 6–20)
CO2: 25 mmol/L (ref 22–32)
Calcium: 9.3 mg/dL (ref 8.9–10.3)
Chloride: 99 mmol/L — ABNORMAL LOW (ref 101–111)
Creatinine, Ser: 0.61 mg/dL (ref 0.44–1.00)
GFR calc Af Amer: >60 mL/min (ref >60)
GLUCOSE: 206 mg/dL — AB (ref 65–99)
POTASSIUM: 4.4 mmol/L (ref 3.5–5.1)
Sodium: 135 mmol/L (ref 135–145)

## 2016-06-16 LAB — CBC WITH DIFFERENTIAL/PLATELET
Basophils Absolute: 0 10*3/uL (ref 0.0–0.1)
Basophils Relative: 0 %
EOS PCT: 3 %
Eosinophils Absolute: 0.2 10*3/uL (ref 0.0–0.7)
HCT: 46.9 % — ABNORMAL HIGH (ref 36.0–46.0)
Hemoglobin: 14.8 g/dL (ref 12.0–15.0)
LYMPHS ABS: 3.3 10*3/uL (ref 0.7–4.0)
LYMPHS PCT: 49 %
MCH: 26.9 pg (ref 26.0–34.0)
MCHC: 31.6 g/dL (ref 30.0–36.0)
MCV: 85.1 fL (ref 78.0–100.0)
MONOS PCT: 5 %
Monocytes Absolute: 0.4 10*3/uL (ref 0.1–1.0)
Neutro Abs: 3 10*3/uL (ref 1.7–7.7)
Neutrophils Relative %: 43 %
PLATELETS: 229 10*3/uL (ref 150–400)
RBC: 5.51 MIL/uL — AB (ref 3.87–5.11)
RDW: 13.4 % (ref 11.5–15.5)
WBC: 6.9 10*3/uL (ref 4.0–10.5)

## 2016-06-16 MED ORDER — GADOBENATE DIMEGLUMINE 529 MG/ML IV SOLN
20.0000 mL | Freq: Once | INTRAVENOUS | Status: AC | PRN
  Administered 2016-06-16: 20 mL via INTRAVENOUS

## 2016-06-16 MED ORDER — SODIUM CHLORIDE 0.9 % IV SOLN
INTRAVENOUS | Status: DC
  Administered 2016-06-16: 21:00:00 via INTRAVENOUS

## 2016-06-16 MED ORDER — OXYCODONE-ACETAMINOPHEN 5-325 MG PO TABS
1.0000 | ORAL_TABLET | Freq: Once | ORAL | Status: AC
  Administered 2016-06-16: 1 via ORAL
  Filled 2016-06-16: qty 1

## 2016-06-16 MED ORDER — LORAZEPAM 2 MG/ML IJ SOLN
1.0000 mg | Freq: Once | INTRAMUSCULAR | Status: AC
  Administered 2016-06-16: 1 mg via INTRAVENOUS
  Filled 2016-06-16: qty 1

## 2016-06-16 MED ORDER — HYDROMORPHONE HCL 1 MG/ML IJ SOLN
1.0000 mg | Freq: Once | INTRAMUSCULAR | Status: AC
  Administered 2016-06-16: 1 mg via INTRAVENOUS
  Filled 2016-06-16: qty 1

## 2016-06-16 MED ORDER — HYDROMORPHONE HCL 1 MG/ML IJ SOLN
1.0000 mg | Freq: Once | INTRAMUSCULAR | Status: DC
  Filled 2016-06-16: qty 1

## 2016-06-16 NOTE — ED Notes (Signed)
Patient transported to MRI 

## 2016-06-16 NOTE — ED Provider Notes (Signed)
[facility] DEPT Provider Note   CSN: [id number] Arrival date & time: 06/16/16  1323     History   Chief Complaint Chief Complaint  Patient presents with  . Numbness    HPI Alexandria Meyer is a 46 y.o. female.  46 year old female with history of chronic back pain present with worsening lower lumbar back pain times several months which is been worse today. She's had gradual loss of bowel and bladder function. Denies any new changes to her symptoms. Pain is characterized as sharp and worse with standing or moving. She has bilateral lower extremity paresthesias. Has been taking Neurontin as well as Flexeril without relief. Nothing makes her symptoms are markedly better.      Past Medical History:  Diagnosis Date  . Diabetes mellitus   . Hypertension   . Neuropathy in diabetes ([place])   . Osteoarthritis     There are no active problems to display for this patient.   Past Surgical History:  Procedure Laterality Date  . CESAREAN SECTION    . TUBAL LIGATION      OB History    No data available       Home Medications    Prior to Admission medications   Medication Sig Start Date End Date Taking? Authorizing Provider  acyclovir (ZOVIRAX) 400 MG tablet Take 400 mg by mouth daily.      [provider]  Choline Fenofibrate 135 MG capsule Take 135 mg by mouth daily.    [provider]  cyclobenzaprine (FLEXERIL) 10 MG tablet Take 10 mg by mouth at bedtime.     [provider]  gabapentin (NEURONTIN) 300 MG capsule Take 300 mg by mouth 3 (three) times daily.      [provider]  insulin aspart (NOVOLOG) 100 UNIT/ML injection Inject 15 Units into the skin 3 (three) times daily before meals. Sliding scale    [provider]  Insulin Glargine (LANTUS SOLOSTAR [place]) Inject 60 Units into the skin 2 (two) times daily.     [provider]  lisinopril-hydrochlorothiazide (PRINZIDE,ZESTORETIC) 10-12.5 MG per tablet Take 1 tablet  by mouth daily.      [provider]  metoprolol tartrate (LOPRESSOR) 25 MG tablet Take 25 mg by mouth daily.      [provider]  simvastatin (ZOCOR) 40 MG tablet Take 40 mg by mouth daily.      [provider]    Family History No family history on file.  Social History Social History  Substance Use Topics  . Smoking status: Never Smoker  . Smokeless tobacco: Never Used  . Alcohol use No     Allergies   Penicillins   Review of Systems Review of Systems  All other systems reviewed and are negative.    Physical Exam Updated Vital Signs BP (!) 141/80   Pulse 89   Temp 98.2 F (36.8 C) (Oral)   Resp 19   Ht 5\' 2"  (1.575 m)   Wt 89.8 kg   LMP 05/27/2016   SpO2 97%   BMI 36.21 kg/m   Physical Exam  Constitutional: She is oriented to person, place, and time. She appears well-developed and well-nourished.  Non-toxic appearance. No distress.  HENT:  Head: Normocephalic and atraumatic.  Eyes: Conjunctivae, EOM and lids are normal. Pupils are equal, round, and reactive to light.  Neck: Normal range of motion. Neck supple. No tracheal deviation present. No thyroid mass present.  Cardiovascular: Normal rate, regular rhythm and normal heart sounds.  Exam reveals no gallop.   No murmur heard. Pulmonary/Chest: Effort normal and breath sounds normal. No stridor. No respiratory distress. She has no decreased breath sounds. She has no wheezes. She has no rhonchi. She has no rales.  Abdominal: Soft. Normal appearance and bowel sounds are normal. She exhibits no distension. There is no tenderness. There is no rebound and no CVA tenderness.  Musculoskeletal: Normal range of motion. She exhibits no edema or tenderness.       Back:  Palpable dorsalis pedis pulse bilaterally  Neurological: She is alert and oriented to person, place, and time. No cranial nerve deficit or sensory deficit. GCS eye subscore is 4. GCS verbal subscore is 5. GCS motor subscore is  6.  Reflex Scores:      Patellar reflexes are 1+ on the right side and 1+ on the left side. Bilateral lower extremity strength 4 out of 5  Skin: Skin is warm and dry. No abrasion and no rash noted.  Psychiatric: She has a normal mood and affect. Her speech is normal and behavior is normal.  Nursing note and vitals reviewed.    ED Treatments / Results  Labs (all labs ordered are listed, but only abnormal results are displayed) Labs Reviewed  CBC WITH DIFFERENTIAL/PLATELET  BASIC METABOLIC PANEL    EKG  EKG Interpretation None       Radiology No results found.  Procedures Procedures (including critical care time)  Medications Ordered in ED Medications  0.9 %  sodium chloride infusion (not administered)  HYDROmorphone (DILAUDID) injection 1 mg (not administered)  LORazepam (ATIVAN) injection 1 mg (not administered)     Initial Impression / Assessment and Plan / ED Course  I have reviewed the triage vital signs and the nursing notes.  Pertinent labs & imaging results that were available during my care of the patient were reviewed by me and considered in my medical decision making (see chart for details).     Patient medicated for pain here and feels better MRI results discussed with patient. Spoke with neurosurgeon on call, Dr. Vertell Limber, no acute intervention is needed. We'll give her a follow-up with him.  Final Clinical Impressions(s) / ED Diagnoses   Final diagnoses:  Pain    New Prescriptions New Prescriptions   No medications on file     Lacretia Leigh, MD 06/16/16 2121

## 2016-06-16 NOTE — ED Triage Notes (Signed)
Pt had surgery on spine on 2013 from herniated disc and was pinching a nerve. Recently went to the ED with the same type of pain and started in right leg and started having pain to right lower back and now it has radiated to the left side.  She has had 3 nerve blocks done recently.  Yesterday from mid shin down to feet went numb and could not walk. Pt reports increased falls and has been walking with a walker for 2-3 months. Pt reports she has been incontinent and wearing depends for 2-3 months.  Pulses palpable to LE bilaterally

## 2016-06-27 ENCOUNTER — Other Ambulatory Visit: Payer: Self-pay | Admitting: Neurosurgery

## 2016-06-29 NOTE — Pre-Procedure Instructions (Signed)
Alexandria Meyer  06/29/2016      [id number] [address], [place] [address] Phone: [phone] Fax: [phone]    Your procedure is scheduled on May 29  Report to [facility] at 530A.M.  Call this number if you have problems the morning of surgery:  [phone]   Remember:  Do not eat food or drink liquids after midnight.  Take these medicines the morning of surgery with A SIP OF WATER cyclobenzaprine (Flexeril) if needed, Gabapentin (neurontin), Hydrocodone (norco) if needed, amitiza, Metoprolol tartrate (Lopressor), valacyclovir (Valtrex)  Stop taking aspirin, BC's, Goody's, Herbal medications, Fish Oil, Ibuprofen, Advil, Motrin, Aleve    How to Manage Your Diabetes Before and After Surgery  Why is it important to control my blood sugar before and after surgery? . Improving blood sugar levels before and after surgery helps healing and can limit problems. . A way of improving blood sugar control is eating a healthy diet by: o  Eating less sugar and carbohydrates o  Increasing activity/exercise o  Talking with your doctor about reaching your blood sugar goals . High blood sugars (greater than 180 mg/dL) can raise your risk of infections and slow your recovery, so you will need to focus on controlling your diabetes during the weeks before surgery. . Make sure that the doctor who takes care of your diabetes knows about your planned surgery including the date and location.  How do I manage my blood sugar before surgery? . Check your blood sugar at least 4 times a day, starting 2 days before surgery, to make sure that the level is not too high or low. o Check your blood sugar the morning of your surgery when you wake up and every 2 hours until you get to the Short Stay unit. . If your blood sugar is less than 70 mg/dL, you will need to treat for low blood sugar: o Do not take insulin. o Treat a low  blood sugar (less than 70 mg/dL) with  cup of clear juice (cranberry or apple), 4 glucose tablets, OR glucose gel. o Recheck blood sugar in 15 minutes after treatment (to make sure it is greater than 70 mg/dL). If your blood sugar is not greater than 70 mg/dL on recheck, call [phone] for further instructions. . Report your blood sugar to the short stay nurse when you get to Short Stay.  . If you are admitted to the hospital after surgery: o Your blood sugar will be checked by the staff and you will probably be given insulin after surgery (instead of oral diabetes medicines) to make sure you have good blood sugar levels. o The goal for blood sugar control after surgery is 80-180 mg/dL.      WHAT DO I DO ABOUT MY DIABETES MEDICATION?   Marland Kitchen Do not take oral diabetes medicines (pills) the morning of surgery. canagliflozin (Invokana)  . THE NIGHT BEFORE SURGERY, take 40 units of Tresiba insulin.       Marland Kitchen HE MORNING OF SURGERY, take 40  units of Tresiba insulin.  . The day of surgery, do not take other diabetes injectables, including Byetta (exenatide), Bydureon (exenatide ER), Victoza (liraglutide), or Trulicity (dulaglutide).  . If your CBG is greater than 220 mg/dL, you may take  of your sliding scale (correction) dose of insulin.  Other Instructions:          Patient Signature:  Date:   Nurse Signature:  Date:  Reviewed and Endorsed by [facility] Patient Education Committee, August 2015  Do not wear jewelry, make-up or nail polish.  Do not wear lotions, powders, or perfumes, or deoderant.  Do not shave 48 hours prior to surgery.  Men may shave face and neck.  Do not bring valuables to the hospital.  [facility] is not responsible for any belongings or valuables.  Contacts, dentures or bridgework may not be worn into surgery.  Leave your suitcase in the car.  After surgery it may be brought to your room.  For patients admitted to the hospital, discharge time will be  determined by your treatment team.  Patients discharged the day of surgery will not be allowed to drive home.    Special instructions:  [facility] - Preparing for Surgery  Before surgery, you can play an important role.  Because skin is not sterile, your skin needs to be as free of germs as possible.  You can reduce the number of germs on you skin by washing with CHG (chlorahexidine gluconate) soap before surgery.  CHG is an antiseptic cleaner which kills germs and bonds with the skin to continue killing germs even after washing.  Please DO NOT use if you have an allergy to CHG or antibacterial soaps.  If your skin becomes reddened/irritated stop using the CHG and inform your nurse when you arrive at Short Stay.  Do not shave (including legs and underarms) for at least 48 hours prior to the first CHG shower.  You may shave your face.  Please follow these instructions carefully:   1.  Shower with CHG Soap the night before surgery and the morning of Surgery.  2.  If you choose to wash your hair, wash your hair first as usual with your  normal shampoo.  3.  After you shampoo, rinse your hair and body thoroughly to remove the  Shampoo.  4.  Use CHG as you would any other liquid soap.  You can apply chg directly   to the skin and wash gently with scrungie or a clean washcloth.  5.  Apply the CHG Soap to your body ONLY FROM THE NECK DOWN.   Do not use on open wounds or open sores.  Avoid contact with your eyes,  ears, mouth and genitals (private parts).  Wash genitals (private parts)  with your normal soap.  6.  Wash thoroughly, paying special attention to the area where your surgery will be performed.  7.  Thoroughly rinse your body with warm water from the neck down.  8.  DO NOT shower/wash with your normal soap after using and rinsing off  the CHG Soap.  9.  Pat yourself dry with a clean towel.            10.  Wear clean pajamas.            11.  Place clean sheets on your bed the night of  your first shower and do not sleep with pets.  Day of Surgery  Do not apply any lotions/deoderants the morning of surgery.  Please wear clean clothes to the hospital/surgery center.     Please read over the following fact sheets that you were given. Pain Booklet, Coughing and Deep Breathing, MRSA Information and Surgical Site Infection Prevention

## 2016-06-30 ENCOUNTER — Encounter (HOSPITAL_COMMUNITY): Payer: Self-pay | Admitting: Emergency Medicine

## 2016-06-30 ENCOUNTER — Encounter (HOSPITAL_COMMUNITY)
Admission: RE | Admit: 2016-06-30 | Discharge: 2016-06-30 | Disposition: A | Discharge status: Home / Self Care | Payer: Medicaid Other | Source: Ambulatory Visit | Attending: Neurosurgery | Admitting: Neurosurgery

## 2016-06-30 ENCOUNTER — Encounter (HOSPITAL_COMMUNITY): Payer: Self-pay

## 2016-06-30 DIAGNOSIS — Z01812 Encounter for preprocedural laboratory examination: Secondary | ICD-10-CM | POA: Diagnosis not present

## 2016-06-30 DIAGNOSIS — M5127 Other intervertebral disc displacement, lumbosacral region: Secondary | ICD-10-CM | POA: Insufficient documentation

## 2016-06-30 DIAGNOSIS — Z01810 Encounter for preprocedural cardiovascular examination: Secondary | ICD-10-CM | POA: Insufficient documentation

## 2016-06-30 LAB — SURGICAL PCR SCREEN
MRSA, PCR: NEGATIVE
STAPHYLOCOCCUS AUREUS: NEGATIVE

## 2016-06-30 LAB — CBC
HEMATOCRIT: 43.7 % (ref 36.0–46.0)
Hemoglobin: 13.6 g/dL (ref 12.0–15.0)
MCH: 26.6 pg (ref 26.0–34.0)
MCHC: 31.1 g/dL (ref 30.0–36.0)
MCV: 85.5 fL (ref 78.0–100.0)
PLATELETS: 222 10*3/uL (ref 150–400)
RBC: 5.11 MIL/uL (ref 3.87–5.11)
RDW: 12.9 % (ref 11.5–15.5)
WBC: 6 10*3/uL (ref 4.0–10.5)

## 2016-06-30 LAB — BASIC METABOLIC PANEL
Anion gap: 9 (ref 5–15)
BUN: 23 mg/dL — AB (ref 6–20)
CO2: 24 mmol/L (ref 22–32)
Calcium: 9.1 mg/dL (ref 8.9–10.3)
Chloride: 104 mmol/L (ref 101–111)
Creatinine, Ser: 0.71 mg/dL (ref 0.44–1.00)
GFR calc Af Amer: >60 mL/min (ref >60)
GLUCOSE: 124 mg/dL — AB (ref 65–99)
Potassium: 3.7 mmol/L (ref 3.5–5.1)
Sodium: 137 mmol/L (ref 135–145)

## 2016-06-30 LAB — GLUCOSE, CAPILLARY: Glucose-Capillary: 133 mg/dL — ABNORMAL HIGH (ref 65–99)

## 2016-06-30 LAB — HCG, SERUM, QUALITATIVE: Preg, Serum: NEGATIVE

## 2016-06-30 NOTE — Progress Notes (Addendum)
Anesthesia Chart Review:  Pt is a 46 year old female scheduled for L5-S1 microdiscectomy on 07/05/2016 with Consuella Lose, MD  - PCP is Ricky Ala, NP at [facility] in [facility] [phone]). Initial (and only) office visit 05/27/16  PMH includes:  HTN, DM. Never smoker.   Medications include Lipitor, Invokana, NovoLog, tresiba, metoprolol  Preoperative labs reviewed.  Glucose 124. HbA1c 13.5.   EKG 06/30/16: NSR. Possible LAE. Rightward axis. Prolonged QT. Non-specific intra-ventricular conduction block  A HbA1c of 13.5 translates to an average daily blood glucose of 341.  PCP's office HbA1c done on 05/27/16 was 13.6.  Per pt, no changes were made to her DM medication regimen during or after visit with PCP 05/27/16 but she was referred to endocrinology (pt thinks her appt is in July).  Pt will need improved DM control and medical clearance prior to surgery.  I notified Nikki in Dr. Cleotilde Neer office.   Willeen Cass, FNP-BC [facility] Short Stay Surgical Center/Anesthesiology Phone: [phone] 07/01/2016 3:51 PM

## 2016-06-30 NOTE — Progress Notes (Addendum)
PCP: [facility]  On [place], [place]  Fasting blood sugars  120

## 2016-07-01 LAB — HEMOGLOBIN A1C
Hgb A1c MFr Bld: 13.5 % — ABNORMAL HIGH (ref 4.8–5.6)
Mean Plasma Glucose: 341 mg/dL

## 2016-07-05 ENCOUNTER — Encounter (HOSPITAL_COMMUNITY): Admission: RE | Payer: Self-pay | Source: Ambulatory Visit

## 2016-07-05 ENCOUNTER — Ambulatory Visit (HOSPITAL_COMMUNITY): Admission: RE | Admit: 2016-07-05 | Payer: Medicaid Other | Source: Ambulatory Visit | Admitting: Neurosurgery

## 2016-07-05 SURGERY — LUMBAR LAMINECTOMY/DECOMPRESSION MICRODISCECTOMY 1 LEVEL
Anesthesia: General | Laterality: Right

## 2016-08-02 DIAGNOSIS — M5416 Radiculopathy, lumbar region: Secondary | ICD-10-CM | POA: Insufficient documentation

## 2017-03-10 DIAGNOSIS — R809 Proteinuria, unspecified: Secondary | ICD-10-CM | POA: Insufficient documentation

## 2017-09-24 DIAGNOSIS — Q453 Other congenital malformations of pancreas and pancreatic duct: Secondary | ICD-10-CM | POA: Insufficient documentation

## 2017-10-12 DIAGNOSIS — G894 Chronic pain syndrome: Secondary | ICD-10-CM | POA: Insufficient documentation

## 2017-10-12 DIAGNOSIS — E669 Obesity, unspecified: Secondary | ICD-10-CM | POA: Insufficient documentation

## 2017-10-12 DIAGNOSIS — F1120 Opioid dependence, uncomplicated: Secondary | ICD-10-CM | POA: Insufficient documentation

## 2018-07-18 DIAGNOSIS — M7540 Impingement syndrome of unspecified shoulder: Secondary | ICD-10-CM | POA: Insufficient documentation

## 2018-08-13 DIAGNOSIS — Z79891 Long term (current) use of opiate analgesic: Secondary | ICD-10-CM | POA: Insufficient documentation

## 2018-10-09 DIAGNOSIS — K760 Fatty (change of) liver, not elsewhere classified: Secondary | ICD-10-CM | POA: Insufficient documentation

## 2018-10-09 DIAGNOSIS — D259 Leiomyoma of uterus, unspecified: Secondary | ICD-10-CM | POA: Insufficient documentation

## 2018-11-08 DIAGNOSIS — G4733 Obstructive sleep apnea (adult) (pediatric): Secondary | ICD-10-CM | POA: Insufficient documentation

## 2019-01-07 DIAGNOSIS — R1310 Dysphagia, unspecified: Secondary | ICD-10-CM | POA: Insufficient documentation

## 2019-03-05 DIAGNOSIS — K3184 Gastroparesis: Secondary | ICD-10-CM | POA: Insufficient documentation

## 2019-05-17 DIAGNOSIS — R768 Other specified abnormal immunological findings in serum: Secondary | ICD-10-CM | POA: Insufficient documentation

## 2019-09-04 DIAGNOSIS — E559 Vitamin D deficiency, unspecified: Secondary | ICD-10-CM | POA: Insufficient documentation

## 2019-11-25 DIAGNOSIS — E1065 Type 1 diabetes mellitus with hyperglycemia: Secondary | ICD-10-CM | POA: Insufficient documentation

## 2019-11-25 DIAGNOSIS — Z9889 Other specified postprocedural states: Secondary | ICD-10-CM | POA: Insufficient documentation

## 2019-11-25 DIAGNOSIS — L409 Psoriasis, unspecified: Secondary | ICD-10-CM | POA: Insufficient documentation

## 2019-11-27 DIAGNOSIS — E113599 Type 2 diabetes mellitus with proliferative diabetic retinopathy without macular edema, unspecified eye: Secondary | ICD-10-CM | POA: Insufficient documentation

## 2019-11-27 DIAGNOSIS — H269 Unspecified cataract: Secondary | ICD-10-CM | POA: Insufficient documentation

## 2019-11-28 DIAGNOSIS — K219 Gastro-esophageal reflux disease without esophagitis: Secondary | ICD-10-CM | POA: Insufficient documentation

## 2019-12-28 ENCOUNTER — Encounter: Payer: Self-pay | Admitting: Family Medicine

## 2019-12-28 ENCOUNTER — Ambulatory Visit
Admission: EM | Admit: 2019-12-28 | Discharge: 2019-12-28 | Disposition: A | Discharge status: Home / Self Care | Payer: Medicaid Other | Attending: Family Medicine | Admitting: Family Medicine

## 2019-12-28 ENCOUNTER — Other Ambulatory Visit: Payer: Self-pay

## 2019-12-28 DIAGNOSIS — L0291 Cutaneous abscess, unspecified: Secondary | ICD-10-CM

## 2019-12-28 NOTE — ED Triage Notes (Signed)
Pt is here with a left arm abscess that started 2 days ago, pt has taken antibiotics to relieve discomfort.

## 2019-12-28 NOTE — ED Provider Notes (Signed)
[facility]    CSN: [id number] Arrival date & time: 12/28/19  1247      History   Chief Complaint Chief Complaint  Patient presents with  . Abscess    HPI Alexandria Meyer is a 49 y.o. female.   49 yo woman with infected left deltoid area.  She tried to schedule an appointment with her primary, but was only seen over internet and doxycycline was ordered.  Sx began 5 days ago.  She's had abscesses before.  Initial [facility] visit.  Patient was prescribed Tramadol 11/15.     Past Medical History:  Diagnosis Date  . Diabetes mellitus   . Hypertension   . Neuropathy in diabetes ([place])   . Osteoarthritis     There are no problems to display for this patient.   Past Surgical History:  Procedure Laterality Date  . BACK SURGERY  2014  . CESAREAN SECTION    . TUBAL LIGATION      OB History   No obstetric history on file.      Home Medications    Prior to Admission medications   Medication Sig Start Date End Date Taking? Authorizing Provider  atorvastatin (LIPITOR) 40 MG tablet Take 40 mg by mouth daily.    [provider]  canagliflozin (INVOKANA) 100 MG TABS tablet Take 100 mg by mouth daily before breakfast.    [provider]  celecoxib (CELEBREX) 100 MG capsule Take 100 mg by mouth 2 (two) times daily.    [provider]  cyclobenzaprine (FLEXERIL) 10 MG tablet Take 10 mg by mouth 3 (three) times daily as needed for muscle spasms.     [provider]  gabapentin (NEURONTIN) 300 MG capsule Take 600 mg by mouth 3 (three) times daily.     [provider]  HYDROcodone Bitartrate ER (HYSINGLA ER) 30 MG T24A Take 30 mg by mouth at bedtime.    [provider]  HYDROcodone-acetaminophen (NORCO) 10-325 MG tablet Take 1 tablet by mouth 2 (two) times daily.    [provider]  insulin aspart (NOVOLOG) 100 UNIT/ML injection Inject 2-8 Units into the skin 3 (three) times daily before meals. Sliding scale     [provider]  Insulin Degludec (TRESIBA FLEXTOUCH) 200 UNIT/ML SOPN Inject 80 Units into the skin 2 (two) times daily.    [provider]  lubiprostone (AMITIZA) 24 MCG capsule Take 24 mcg by mouth 2 (two) times daily with a meal.    [provider]  metoprolol tartrate (LOPRESSOR) 50 MG tablet Take 50 mg by mouth 2 (two) times daily.    [provider]  valACYclovir (VALTREX) 500 MG tablet Take 500 mg by mouth daily.    [provider]    Family History Family History  Problem Relation Age of Onset  . Diabetes Mother   . Diabetes Father     Social History Social History   Tobacco Use  . Smoking status: Never Smoker  . Smokeless tobacco: Never Used  Substance Use Topics  . Alcohol use: No  . Drug use: No     Allergies   Exenatide, Lisinopril, Penicillins, Canagliflozin, and Metformin   Review of Systems Review of Systems  Skin: Positive for wound.     Physical Exam Triage Vital Signs ED Triage Vitals  Enc Vitals Group     BP      Pulse      Resp      Temp  Temp src      SpO2      Weight      Height      Head Circumference      Peak Flow      Pain Score      Pain Loc      Pain Edu?      Excl. in [place]?    No data found.  Updated Vital Signs BP 130/82 (BP Location: Right Arm)   Pulse 82   Temp 98.3 F (36.8 C) (Oral)   Resp 20   LMP 06/28/2016   SpO2 95%    Physical Exam Vitals and nursing note reviewed.  Constitutional:      Appearance: Normal appearance.  Musculoskeletal:        General: Normal range of motion.  Skin:    Comments: 3 inch area of induration and erythema left deltoid  Neurological:     Mental Status: She is alert.      UC Treatments / Results  Labs (all labs ordered are listed, but only abnormal results are displayed) Labs Reviewed - No data to display  EKG   Radiology No results found.  Procedures Incision and Drainage  Date/Time: 12/28/2019 1:30  PM Performed by: Robyn Haber, MD Authorized by: Robyn Haber, MD   Consent:    Consent obtained:  Verbal   Consent given by:  Patient   Risks discussed:  Bleeding, incomplete drainage and infection   Alternatives discussed:  No treatment Location:    Type:  Abscess   Location:  Upper extremity   Upper extremity location:  Shoulder   Shoulder location:  L shoulder Pre-procedure details:    Skin preparation:  Hibiclens Anesthesia (see MAR for exact dosages):    Anesthesia method:  Local infiltration   Local anesthetic:  Lidocaine 1% w/o epi Procedure type:    Complexity:  Simple Procedure details:    Needle aspiration: no     Incision types:  Stab incision   Incision depth:  Dermal   Scalpel blade:  11   Wound management:  Probed and deloculated   Drainage:  Bloody and purulent   Drainage amount:  Moderate   Wound treatment:  Drain placed   Packing materials:  1/2 in gauze Post-procedure details:    Patient tolerance of procedure:  Tolerated well, no immediate complications   (including critical care time)  Medications Ordered in UC Medications - No data to display  Initial Impression / Assessment and Plan / UC Course  I have reviewed the triage vital signs and the nursing notes.  Pertinent labs & imaging results that were available during my care of the patient were reviewed by me and considered in my medical decision making (see chart for details).    Final Clinical Impressions(s) / UC Diagnoses   Final diagnoses:  Abscess     Discharge Instructions     Return on Tuesday  Continue the antibiotic    ED Prescriptions    None     I have reviewed the PDMP during this encounter.   Robyn Haber, MD 12/28/19 [id number]

## 2019-12-28 NOTE — Discharge Instructions (Addendum)
Return on Tuesday  Continue the antibiotic

## 2019-12-31 ENCOUNTER — Encounter: Payer: Self-pay | Admitting: Emergency Medicine

## 2019-12-31 ENCOUNTER — Ambulatory Visit
Admission: EM | Admit: 2019-12-31 | Discharge: 2019-12-31 | Disposition: A | Discharge status: Home / Self Care | Payer: Medicaid Other

## 2019-12-31 DIAGNOSIS — Z5189 Encounter for other specified aftercare: Secondary | ICD-10-CM | POA: Diagnosis not present

## 2019-12-31 NOTE — ED Provider Notes (Signed)
[facility]    CSN: [id number] Arrival date & time: 12/31/19  1258      History   Chief Complaint Chief Complaint  Patient presents with  . Wound Check    HPI Alexandria Meyer is a 49 y.o. female.   Meagen Timothy Lasso presents for follow up and packing removal of left deltoid abscess which was incised here on 11/20. Still with drainage but has decreased. Pain has minimally improved. Taking doxycycline. States she has had similar in the past with previous abscesses. She is diabetic.    ROS per HPI, negative if not otherwise mentioned.      Past Medical History:  Diagnosis Date  . Diabetes mellitus   . Hypertension   . Neuropathy in diabetes ([place])   . Osteoarthritis     There are no problems to display for this patient.   Past Surgical History:  Procedure Laterality Date  . BACK SURGERY  2014  . CESAREAN SECTION    . TUBAL LIGATION      OB History   No obstetric history on file.      Home Medications    Prior to Admission medications   Medication Sig Start Date End Date Taking? Authorizing Provider  atorvastatin (LIPITOR) 40 MG tablet Take 40 mg by mouth daily.    [provider]  canagliflozin (INVOKANA) 100 MG TABS tablet Take 100 mg by mouth daily before breakfast.    [provider]  celecoxib (CELEBREX) 100 MG capsule Take 100 mg by mouth 2 (two) times daily.    [provider]  cyclobenzaprine (FLEXERIL) 10 MG tablet Take 10 mg by mouth 3 (three) times daily as needed for muscle spasms.     [provider]  gabapentin (NEURONTIN) 300 MG capsule Take 600 mg by mouth 3 (three) times daily.     [provider]  HYDROcodone Bitartrate ER (HYSINGLA ER) 30 MG T24A Take 30 mg by mouth at bedtime.    [provider]  HYDROcodone-acetaminophen (NORCO) 10-325 MG tablet Take 1 tablet by mouth 2 (two) times daily.    [provider]  insulin aspart (NOVOLOG) 100 UNIT/ML injection Inject  2-8 Units into the skin 3 (three) times daily before meals. Sliding scale    [provider]  Insulin Degludec (TRESIBA FLEXTOUCH) 200 UNIT/ML SOPN Inject 80 Units into the skin 2 (two) times daily.    [provider]  lubiprostone (AMITIZA) 24 MCG capsule Take 24 mcg by mouth 2 (two) times daily with a meal.    [provider]  metoprolol tartrate (LOPRESSOR) 50 MG tablet Take 50 mg by mouth 2 (two) times daily.    [provider]  valACYclovir (VALTREX) 500 MG tablet Take 500 mg by mouth daily.    [provider]    Family History Family History  Problem Relation Age of Onset  . Diabetes Mother   . Diabetes Father     Social History Social History   Tobacco Use  . Smoking status: Never Smoker  . Smokeless tobacco: Never Used  Substance Use Topics  . Alcohol use: No  . Drug use: No     Allergies   Exenatide, Lisinopril, Penicillins, Canagliflozin, and Metformin   Review of Systems Review of Systems   Physical Exam Triage Vital Signs ED Triage Vitals  Enc Vitals Group     BP 12/31/19 1305 129/79     Pulse Rate 12/31/19 1305 86     Resp 12/31/19 1305  16     Temp 12/31/19 1305 99.1 F (37.3 C)     Temp Source 12/31/19 1305 Oral     SpO2 12/31/19 1305 93 %     Weight --      Height --      Head Circumference --      Peak Flow --      Pain Score 12/31/19 1303 8     Pain Loc --      Pain Edu? --      Excl. in [place]? --    No data found.  Updated Vital Signs BP 129/79 (BP Location: Right Arm)   Pulse 86   Temp 99.1 F (37.3 C) (Oral)   Resp 16   LMP 06/28/2016   SpO2 93%   Visual Acuity Right Eye Distance:   Left Eye Distance:   Bilateral Distance:    Right Eye Near:   Left Eye Near:    Bilateral Near:     Physical Exam Constitutional:      General: She is not in acute distress.    Appearance: She is well-developed.  Cardiovascular:     Rate and Rhythm: Normal rate.  Pulmonary:     Effort: Pulmonary  effort is normal.  Skin:    General: Skin is warm and dry.     Comments: Left deltoid with region of approximately 1 cm in diameter of red tender firm induration surrounding previous incision; copious purulent discharge with packing removal and palpation; visous lidocaine applied prior to packing removal; approximately 1 inch of packing replaced   Neurological:     Mental Status: She is alert and oriented to person, place, and time.      UC Treatments / Results  Labs (all labs ordered are listed, but only abnormal results are displayed) Labs Reviewed - No data to display  EKG   Radiology No results found.  Procedures Procedures (including critical care time)  Medications Ordered in UC Medications - No data to display  Initial Impression / Assessment and Plan / UC Course  I have reviewed the triage vital signs and the nursing notes.  Pertinent labs & imaging results that were available during my care of the patient were reviewed by me and considered in my medical decision making (see chart for details).     Copious drainage from abscess to left deltoid with packing removal. Packing replaced. Patient states her daughter is an Therapist, sports and would remove packing in another two days (since it is holiday) , return on Friday if still no improvement. Patient verbalized understanding and agreeable to plan.   Final Clinical Impressions(s) / UC Diagnoses   Final diagnoses:  Wound check, abscess     Discharge Instructions     This packing may be removed tomorrow evening or Thursday.  Apply warm compresses to the area to promote further drainage.  Continue with prescribed antibiotics.  Return Friday for recheck, particularly if no improvement or if worsening.    ED Prescriptions    None     PDMP not reviewed this encounter.   Zigmund Gottron, NP 12/31/19 1353

## 2019-12-31 NOTE — ED Triage Notes (Signed)
Pt here to have packing removed from an abscess to L arm.

## 2019-12-31 NOTE — Discharge Instructions (Signed)
This packing may be removed tomorrow evening or Thursday.  Apply warm compresses to the area to promote further drainage.  Continue with prescribed antibiotics.  Return Friday for recheck, particularly if no improvement or if worsening.

## 2020-01-03 ENCOUNTER — Ambulatory Visit
Admission: EM | Admit: 2020-01-03 | Discharge: 2020-01-03 | Disposition: A | Discharge status: Home / Self Care | Payer: Medicaid Other | Attending: Emergency Medicine | Admitting: Emergency Medicine

## 2020-01-03 ENCOUNTER — Other Ambulatory Visit: Payer: Self-pay

## 2020-01-03 ENCOUNTER — Encounter: Payer: Self-pay | Admitting: Emergency Medicine

## 2020-01-03 DIAGNOSIS — L02419 Cutaneous abscess of limb, unspecified: Secondary | ICD-10-CM | POA: Diagnosis not present

## 2020-01-03 DIAGNOSIS — T148XXD Other injury of unspecified body region, subsequent encounter: Secondary | ICD-10-CM | POA: Diagnosis not present

## 2020-01-03 DIAGNOSIS — Z5189 Encounter for other specified aftercare: Secondary | ICD-10-CM | POA: Diagnosis not present

## 2020-01-03 MED ORDER — SULFAMETHOXAZOLE-TRIMETHOPRIM 800-160 MG PO TABS: 1.0000 | ORAL_TABLET | Freq: Two times a day (BID) | ORAL | 0 refills | Status: AC

## 2020-01-03 NOTE — Discharge Instructions (Signed)
Keep area(s) clean and dry. Apply hot compress / towel for 5-10 minutes 3-5 times daily. Take antibiotic as prescribed with food - important to complete course. Return for worsening pain, redness, swelling, discharge, fever.  Helpful prevention tips: Keep nails short to avoid secondary skin infections. Use new, clean razors when shaving. Avoid antiperspirants - look for deodorants without aluminum. Avoid wearing underwire bras as this can irritate the area further.

## 2020-01-03 NOTE — ED Provider Notes (Signed)
Alexandria Meyer    CSN: [id number] Arrival date & time: 01/03/20  1158      History   Chief Complaint Chief Complaint  Patient presents with  . Abscess  . Wound Check    HPI Alexandria Meyer is a 49 y.o. female  With history as below presenting for wound check.  Patient initially seen for this 11/20: Underwent I&D, given doxycycline.  Had wound check on 11/23: Continue doxycycline, partially removed packing, informed to return today if symptoms persist or worsen.  Overall, patient feels lesion is improving, though still actively draining with pain.  Past Medical History:  Diagnosis Date  . Diabetes mellitus   . Hypertension   . Neuropathy in diabetes ([place])   . Osteoarthritis     There are no problems to display for this patient.   Past Surgical History:  Procedure Laterality Date  . BACK SURGERY  2014  . CESAREAN SECTION    . TUBAL LIGATION      OB History   No obstetric history on file.      Home Medications    Prior to Admission medications   Medication Sig Start Date End Date Taking? Authorizing Provider  atorvastatin (LIPITOR) 40 MG tablet Take 40 mg by mouth daily.    [provider]  canagliflozin (INVOKANA) 100 MG TABS tablet Take 100 mg by mouth daily before breakfast.    [provider]  celecoxib (CELEBREX) 100 MG capsule Take 100 mg by mouth 2 (two) times daily.    [provider]  cyclobenzaprine (FLEXERIL) 10 MG tablet Take 10 mg by mouth 3 (three) times daily as needed for muscle spasms.     [provider]  gabapentin (NEURONTIN) 300 MG capsule Take 600 mg by mouth 3 (three) times daily.     [provider]  HYDROcodone Bitartrate ER (HYSINGLA ER) 30 MG T24A Take 30 mg by mouth at bedtime.    [provider]  HYDROcodone-acetaminophen (NORCO) 10-325 MG tablet Take 1 tablet by mouth 2 (two) times daily.    [provider]  insulin aspart (NOVOLOG) 100 UNIT/ML injection  Inject 2-8 Units into the skin 3 (three) times daily before meals. Sliding scale    [provider]  Insulin Degludec (TRESIBA FLEXTOUCH) 200 UNIT/ML SOPN Inject 80 Units into the skin 2 (two) times daily.    [provider]  lubiprostone (AMITIZA) 24 MCG capsule Take 24 mcg by mouth 2 (two) times daily with a meal.    [provider]  metoprolol tartrate (LOPRESSOR) 50 MG tablet Take 50 mg by mouth 2 (two) times daily.    [provider]  sulfamethoxazole-trimethoprim (BACTRIM DS) 800-160 MG tablet Take 1 tablet by mouth 2 (two) times daily for 7 days. 01/03/20 01/10/20  Hall-Potvin, [place], PA-C  valACYclovir (VALTREX) 500 MG tablet Take 500 mg by mouth daily.    [provider]    Family History Family History  Problem Relation Age of Onset  . Diabetes Mother   . Diabetes Father     Social History Social History   Tobacco Use  . Smoking status: Never Smoker  . Smokeless tobacco: Never Used  Substance Use Topics  . Alcohol use: No  . Drug use: No     Allergies   Exenatide, Lisinopril, Penicillins, Canagliflozin, and Metformin   Review of Systems Review of Systems  Constitutional: Negative for fatigue and fever.  HENT: Negative for ear pain, sinus pain, sore throat and voice change.  Eyes: Negative for pain, redness and visual disturbance.  Respiratory: Negative for cough and shortness of breath.   Cardiovascular: Negative for chest pain and palpitations.  Gastrointestinal: Negative for abdominal pain, diarrhea and vomiting.  Musculoskeletal: Negative for arthralgias and myalgias.  Skin: Positive for wound. Negative for rash.  Neurological: Negative for syncope and headaches.     Physical Exam Triage Vital Signs ED Triage Vitals  Enc Vitals Group     BP 01/03/20 1216 (!) 145/79     Pulse Rate 01/03/20 1216 75     Resp 01/03/20 1216 18     Temp 01/03/20 1216 98 F (36.7 C)     Temp Source 01/03/20 1216 Oral     SpO2  01/03/20 1216 94 %     Weight --      Height --      Head Circumference --      Peak Flow --      Pain Score 01/03/20 1217 7     Pain Loc --      Pain Edu? --      Excl. in [place]? --    No data found.  Updated Vital Signs BP (!) 145/79 (BP Location: Right Arm)   Pulse 75   Temp 98 F (36.7 C) (Oral)   Resp 18   LMP 06/28/2016   SpO2 94%   Visual Acuity Right Eye Distance:   Left Eye Distance:   Bilateral Distance:    Right Eye Near:   Left Eye Near:    Bilateral Near:     Physical Exam Constitutional:      General: She is not in acute distress. HENT:     Head: Normocephalic and atraumatic.  Eyes:     General: No scleral icterus.    Pupils: Pupils are equal, round, and reactive to light.  Cardiovascular:     Rate and Rhythm: Normal rate.  Pulmonary:     Effort: Pulmonary effort is normal.  Musculoskeletal:        General: Swelling and tenderness present.     Comments: Left deltoid with <1 cm incision.  Approximately 2 cm of packing removed.  Patient does have copious purulence with surrounding induration.  No focal fluctuance, streaking.  Skin:    Coloration: Skin is not jaundiced or pale.     Findings: Erythema present. No bruising.  Neurological:     Mental Status: She is alert and oriented to person, place, and time.      UC Treatments / Results  Labs (all labs ordered are listed, but only abnormal results are displayed) Labs Reviewed - No data to display  EKG   Radiology No results found.  Procedures Procedures (including critical Meyer time)  Medications Ordered in UC Medications - No data to display  Initial Impression / Assessment and Plan / UC Course  I have reviewed the triage vital signs and the nursing notes.  Pertinent labs & imaging results that were available during my Meyer of the patient were reviewed by me and considered in my medical decision making (see chart for details).     Per chart review, patient's calculated CrCl  greater than 30.  Will start Bactrim in addition to doxycycline, follow-up with wound clinic next week.  Return precautions discussed, pt verbalized understanding and is agreeable to plan. Final Clinical Impressions(s) / UC Diagnoses   Final diagnoses:  Delayed wound healing  Abscess of upper arm  Encounter for wound re-check     Discharge Instructions  Keep area(s) clean and dry. Apply hot compress / towel for 5-10 minutes 3-5 times daily. Take antibiotic as prescribed with food - important to complete course. Return for worsening pain, redness, swelling, discharge, fever.  Helpful prevention tips: Keep nails short to avoid secondary skin infections. Use new, clean razors when shaving. Avoid antiperspirants - look for deodorants without aluminum. Avoid wearing underwire bras as this can irritate the area further.     ED Prescriptions    Medication Sig Dispense Auth. Provider   sulfamethoxazole-trimethoprim (BACTRIM DS) 800-160 MG tablet Take 1 tablet by mouth 2 (two) times daily for 7 days. 14 tablet Hall-Potvin, [place], PA-C     PDMP not reviewed this encounter.   Hall-Potvin, [place], [place] 01/03/20 1328

## 2020-01-03 NOTE — ED Triage Notes (Signed)
Pt here for abscess on left arm; pt sts increased drainage and odor despite taking antibiotics

## 2020-01-17 ENCOUNTER — Other Ambulatory Visit: Payer: Self-pay

## 2020-01-17 ENCOUNTER — Emergency Department (HOSPITAL_COMMUNITY)
Admission: EM | Admit: 2020-01-17 | Discharge: 2020-01-17 | Disposition: A | Discharge status: Home / Self Care | Payer: Medicaid Other | Attending: Emergency Medicine | Admitting: Emergency Medicine

## 2020-01-17 ENCOUNTER — Encounter (HOSPITAL_COMMUNITY): Payer: Self-pay

## 2020-01-17 DIAGNOSIS — I10 Essential (primary) hypertension: Secondary | ICD-10-CM | POA: Diagnosis not present

## 2020-01-17 DIAGNOSIS — Z79899 Other long term (current) drug therapy: Secondary | ICD-10-CM | POA: Insufficient documentation

## 2020-01-17 DIAGNOSIS — L02414 Cutaneous abscess of left upper limb: Secondary | ICD-10-CM | POA: Diagnosis present

## 2020-01-17 DIAGNOSIS — E119 Type 2 diabetes mellitus without complications: Secondary | ICD-10-CM | POA: Diagnosis not present

## 2020-01-17 DIAGNOSIS — Z794 Long term (current) use of insulin: Secondary | ICD-10-CM | POA: Diagnosis not present

## 2020-01-17 HISTORY — DX: Scoliosis, unspecified: M41.9

## 2020-01-17 MED ORDER — ONDANSETRON HCL 4 MG PO TABS: 4.0000 mg | ORAL_TABLET | Freq: Four times a day (QID) | ORAL | 0 refills | Status: AC

## 2020-01-17 MED ORDER — ONDANSETRON HCL 4 MG PO TABS: 4.0000 mg | ORAL_TABLET | Freq: Four times a day (QID) | ORAL | 0 refills | Status: DC

## 2020-01-17 NOTE — ED Provider Notes (Signed)
[place] DEPT Provider Note   CSN: [id number] Arrival date & time: 01/17/20  1337     History Chief Complaint  Patient presents with  . Abscess    Alexandria Meyer is a 49 y.o. female.  49 y.o female with a PMH of DM, HTN, OA presents to the ED with a chief complaint of left shoulder abscess x 2 weeks. Patient was seen at [facility] where she had a I&D, she was placed on doxycycline, reports the antibiotic was  Making her very nauseous therefore he stopped taking them. She was then placed on Bactrim, which she also reports was making her nauseous. Patient was evaluated by her PCP yesterday, she has been taking a third generation antibiotic. She reports completing about 1 pack. There is no redness, no fever, but wound is actively drainning.   The history is provided by the patient.       Past Medical History:  Diagnosis Date  . Diabetes mellitus   . Hypertension   . Neuropathy in diabetes ([place])   . Osteoarthritis   . Scoliosis     There are no problems to display for this patient.   Past Surgical History:  Procedure Laterality Date  . BACK SURGERY  2014  . CESAREAN SECTION    . TUBAL LIGATION       OB History   No obstetric history on file.     Family History  Problem Relation Age of Onset  . Diabetes Mother   . Diabetes Father     Social History   Tobacco Use  . Smoking status: Never Smoker  . Smokeless tobacco: Never Used  Vaping Use  . Vaping Use: Never used  Substance Use Topics  . Alcohol use: No  . Drug use: No    Home Medications Prior to Admission medications   Medication Sig Start Date End Date Taking? Authorizing Provider  atorvastatin (LIPITOR) 40 MG tablet Take 40 mg by mouth daily.    [provider]  canagliflozin (INVOKANA) 100 MG TABS tablet Take 100 mg by mouth daily before breakfast.    [provider]  celecoxib (CELEBREX) 100 MG capsule Take 100 mg by mouth 2 (two) times daily.     [provider]  cyclobenzaprine (FLEXERIL) 10 MG tablet Take 10 mg by mouth 3 (three) times daily as needed for muscle spasms.     [provider]  gabapentin (NEURONTIN) 300 MG capsule Take 600 mg by mouth 3 (three) times daily.     [provider]  HYDROcodone Bitartrate ER (HYSINGLA ER) 30 MG T24A Take 30 mg by mouth at bedtime.    [provider]  HYDROcodone-acetaminophen (NORCO) 10-325 MG tablet Take 1 tablet by mouth 2 (two) times daily.    [provider]  insulin aspart (NOVOLOG) 100 UNIT/ML injection Inject 2-8 Units into the skin 3 (three) times daily before meals. Sliding scale    [provider]  Insulin Degludec (TRESIBA FLEXTOUCH) 200 UNIT/ML SOPN Inject 80 Units into the skin 2 (two) times daily.    [provider]  lubiprostone (AMITIZA) 24 MCG capsule Take 24 mcg by mouth 2 (two) times daily with a meal.    [provider]  metoprolol tartrate (LOPRESSOR) 50 MG tablet Take 50 mg by mouth 2 (two) times daily.    [provider]  ondansetron (ZOFRAN) 4 MG tablet Take 1 tablet (4 mg total) by mouth every 6 (six) hours for 10 days. 01/17/20 01/27/20  Janeece Fitting, PA-C  valACYclovir (VALTREX) 500 MG tablet Take 500 mg by mouth daily.    [provider]    Allergies    Exenatide, Lisinopril, Penicillins, Canagliflozin, and Metformin  Review of Systems   Review of Systems  Constitutional: Negative for fever.  Respiratory: Negative for shortness of breath.   Cardiovascular: Negative for chest pain.  Skin: Positive for wound.    Physical Exam Updated Vital Signs BP (!) 151/80 (BP Location: Right Arm)   Pulse 89   Temp 98.8 F (37.1 C) (Oral)   Resp 18   Ht 5\' 2"  (1.575 m)   Wt 96.2 kg   LMP 06/28/2016   SpO2 96%   BMI 38.78 kg/m   Physical Exam Vitals and nursing note reviewed.  Constitutional:      Appearance: Normal appearance.  HENT:     Head: Normocephalic and  atraumatic.     Mouth/Throat:     Mouth: Mucous membranes are moist.  Pulmonary:     Effort: Pulmonary effort is normal.     Breath sounds: No wheezing.  Abdominal:     General: Abdomen is flat.  Musculoskeletal:     Cervical back: Normal range of motion and neck supple.  Skin:    General: Skin is warm.     Findings: No erythema.          Comments: Actively draining with green discharge, no fluctuant noted, no streaking or surrounding erythema.   Neurological:     Mental Status: She is alert and oriented to person, place, and time.     ED Results / Procedures / Treatments   Labs (all labs ordered are listed, but only abnormal results are displayed) Labs Reviewed - No data to display  EKG None  Radiology No results found.  Procedures Procedures (including critical care time)  Medications Ordered in ED Medications - No data to display  ED Course  I have reviewed the triage vital signs and the nursing notes.  Pertinent labs & imaging results that were available during my care of the patient were reviewed by me and considered in my medical decision making (see chart for details).    MDM Rules/Calculators/A&P   Patient with a past medical history of diabetes presents to the ED with a chief complaint of left shoulder abscess, this was originally drained on November 26 at urgent care, the wound has been packed twice.  She has was placed on a third antibiotic by her PCP, she has been taking this successfully, was set up with wound care however the appointment is not until January 2022.  Patient reports she has been experiencing some drainage from it along with pain but no fevers.  While in the ED, patient received call from PCP who advised that a nurse will be coming out to clean the wound on Monday.  Will irrigate wound extensively while in the ED.  I will also provide her with a short prescription of Zofran to help with the nausea.  Patient understands and agrees with  management, no systemic signs at this time.  Return precautions discussed at length.   Portions of this note were generated with [employer]. Dictation errors may occur despite best attempts at proofreading.  Final Clinical Impression(s) / ED Diagnoses Final diagnoses:  Abscess of left shoulder    Rx / DC Orders ED Discharge Orders         Ordered    ondansetron (ZOFRAN) 4 MG tablet  Every 6 hours,  Status:  Discontinued        01/17/20 1612    ondansetron (ZOFRAN) 4 MG tablet  Every 6 hours        01/17/20 1633           Janeece Fitting, PA-C 01/17/20 1635    Drenda Freeze, MD 01/17/20 2259

## 2020-01-17 NOTE — Discharge Instructions (Addendum)
I have provided a prescription for nausea medication, please take this as directed.  You will need to complete your antibiotics in order to help with resolution of your left shoulder abscess.  Please continue wound care at home with mainly saline.  If you experience any fever, worsening symptoms please return to emergency room.

## 2020-01-17 NOTE — ED Triage Notes (Signed)
Patient reports that she had an abscess to the left upper arm. Patient went to an UC and had the wound packed twice. Patient states that she took Doxycycline. patient states she also had two other antibiotics ordered, but Bactrim made her sick. Patient continues to have a foul smelling  Drainage.

## 2020-01-24 ENCOUNTER — Other Ambulatory Visit (HOSPITAL_COMMUNITY)
Admission: RE | Admit: 2020-01-24 | Discharge: 2020-01-24 | Disposition: A | Discharge status: Home / Self Care | Payer: Medicaid Other | Attending: Internal Medicine | Admitting: Internal Medicine

## 2020-01-24 ENCOUNTER — Other Ambulatory Visit (HOSPITAL_COMMUNITY): Payer: Self-pay | Admitting: Internal Medicine

## 2020-01-24 ENCOUNTER — Encounter (HOSPITAL_BASED_OUTPATIENT_CLINIC_OR_DEPARTMENT_OTHER): Payer: Medicaid Other | Attending: Internal Medicine | Admitting: Internal Medicine

## 2020-01-24 ENCOUNTER — Ambulatory Visit (HOSPITAL_COMMUNITY)
Admission: RE | Admit: 2020-01-24 | Discharge: 2020-01-24 | Disposition: A | Discharge status: Home / Self Care | Payer: Medicaid Other | Source: Ambulatory Visit | Attending: Internal Medicine | Admitting: Internal Medicine

## 2020-01-24 ENCOUNTER — Other Ambulatory Visit: Payer: Self-pay

## 2020-01-24 DIAGNOSIS — I10 Essential (primary) hypertension: Secondary | ICD-10-CM | POA: Insufficient documentation

## 2020-01-24 DIAGNOSIS — E11622 Type 2 diabetes mellitus with other skin ulcer: Secondary | ICD-10-CM | POA: Insufficient documentation

## 2020-01-24 DIAGNOSIS — S41109A Unspecified open wound of unspecified upper arm, initial encounter: Secondary | ICD-10-CM | POA: Diagnosis present

## 2020-01-24 DIAGNOSIS — L98498 Non-pressure chronic ulcer of skin of other sites with other specified severity: Secondary | ICD-10-CM | POA: Diagnosis not present

## 2020-01-24 DIAGNOSIS — Z880 Allergy status to penicillin: Secondary | ICD-10-CM | POA: Diagnosis not present

## 2020-01-24 DIAGNOSIS — Z8614 Personal history of Methicillin resistant Staphylococcus aureus infection: Secondary | ICD-10-CM | POA: Diagnosis not present

## 2020-01-24 DIAGNOSIS — M25512 Pain in left shoulder: Secondary | ICD-10-CM | POA: Insufficient documentation

## 2020-01-24 DIAGNOSIS — L02818 Cutaneous abscess of other sites: Secondary | ICD-10-CM | POA: Insufficient documentation

## 2020-01-24 NOTE — Progress Notes (Signed)
NATAJAH, DERDERIAN ([id number]) Visit Report for 01/24/2020 Abuse/Suicide Risk Screen Details Patient Name: Date of Service: HA WYNNIE, PACETTI. 01/24/2020 7:30 A M Medical Record Number: [id number] Patient Account Number: [id number] Date of Birth/Sex: Treating RN: 06-14-1970 (49 y.o. Orvan Falconer Primary Care Ellinore Merced: Carron Curie, GUILFO RD Other Clinician: Referring Madaleine Simmon: Treating Deitrich Steve/Extender: Alvy Beal RTMENT, GUILFO RD Weeks in Treatment: 0 Abuse/Suicide Risk Screen Items Answer ABUSE RISK SCREEN: Has anyone close to you tried to hurt or harm you recentlyo No Do you feel uncomfortable with anyone in your familyo No Has anyone forced you do things that you didnt want to doo No Electronic Signature(s) Signed: 01/24/2020 5:23:39 PM By: Carlene Coria RN Entered By: Carlene Coria on 01/24/2020 08:09:12 -------------------------------------------------------------------------------- Activities of Daily Living Details Patient Name: Date of Service: THEODORE, RAHRIG. 01/24/2020 7:30 A M Medical Record Number: [id number] Patient Account Number: [id number] Date of Birth/Sex: Treating RN: 10-19-1970 (49 y.o. Orvan Falconer Primary Care Conor Filsaime: Carron Curie, GUILFO RD Other Clinician: Referring Dell Briner: Treating Jahleah Mariscal/Extender: Alvy Beal RTMENT, GUILFO RD Weeks in Treatment: 0 Activities of Daily Living Items Answer Activities of Daily Living (Please select one for each item) Drive Automobile Completely Able T Medications ake Completely Able Use T elephone Completely Able Care for Appearance Completely Able Use T oilet Completely Able Bath / Shower Completely Able Dress Self Completely Able Feed Self Completely Able Walk Completely Able Get In / Out Bed Completely Able Housework Completely Able Prepare Meals Completely [place] for Self Completely Able Electronic Signature(s) Signed: 01/24/2020 5:23:39 PM  By: Carlene Coria RN Entered By: Carlene Coria on 01/24/2020 08:09:41 -------------------------------------------------------------------------------- Education Screening Details Patient Name: Date of Service: [name] 01/24/2020 7:30 A M Medical Record Number: [id number] Patient Account Number: [id number] Date of Birth/Sex: Treating RN: October 25, 1970 (49 y.o. Orvan Falconer Primary Care Termaine Roupp: Carron Curie, GUILFO RD Other Clinician: Referring Nilam Quakenbush: Treating Christinamarie Tall/Extender: Alvy Beal RTMENT, GUILFO RD Weeks in Treatment: 0 Primary Learner Assessed: Patient Learning Preferences/Education Level/Primary Language Learning Preference: Explanation Highest Education Level: High School Preferred Language: English Cognitive Barrier Language Barrier: No Translator Needed: No Memory Deficit: No Emotional Barrier: No Cultural/Religious Beliefs Affecting Medical Care: No Physical Barrier Impaired Vision: Yes Glasses Impaired Hearing: No Decreased Hand dexterity: No Knowledge/Comprehension Knowledge Level: Medium Comprehension Level: High Ability to understand written instructions: High Ability to understand verbal instructions: High Motivation Anxiety Level: Anxious Cooperation: Cooperative Education Importance: Acknowledges Need Interest in Health Problems: Asks Questions Perception: Coherent Willingness to Engage in Self-Management High Activities: Readiness to Engage in Self-Management High Activities: Electronic Signature(s) Signed: 01/24/2020 5:23:39 PM By: Carlene Coria RN Entered By: Carlene Coria on 01/24/2020 08:10:16 -------------------------------------------------------------------------------- Fall Risk Assessment Details Patient Name: Date of Service: HA Mickel Crow M. 01/24/2020 7:30 A M Medical Record Number: [id number] Patient Account Number: [id number] Date of Birth/Sex: Treating RN: [birth year] (49 y.o. Orvan Falconer Primary  Care Maryellen Dowdle: Carron Curie, GUILFO RD Other Clinician: Referring Bettyjo Lundblad: Treating Cassy Sprowl/Extender: Alvy Beal RTMENT, GUILFO RD Weeks in Treatment: 0 Fall Risk Assessment Items Have you had 2 or more falls in the last 12 monthso 0 No Have you had any fall that resulted in injury in the last 12 monthso 0 No FALLS RISK SCREEN History of falling - immediate or within 3 months 0 No Secondary diagnosis (Do you have 2 or more medical diagnoseso) 0 No Ambulatory aid None/bed rest/wheelchair/nurse 0 No Crutches/cane/walker 0 No Furniture 0 No Intravenous therapy  Access/Saline/Heparin Lock 0 No Gait/Transferring Normal/ bed rest/ wheelchair 0 No Weak (short steps with or without shuffle, stooped but able to lift head while walking, may seek 0 No support from furniture) Impaired (short steps with shuffle, may have difficulty arising from chair, head down, impaired 0 No balance) Mental Status Oriented to own ability 0 No Electronic Signature(s) Signed: 01/24/2020 5:23:39 PM By: Carlene Coria RN Entered By: Carlene Coria on 01/24/2020 08:11:49 -------------------------------------------------------------------------------- Nutrition Risk Screening Details Patient Name: Date of Service: EIZA, CANNIFF 01/24/2020 7:30 A M Medical Record Number: [id number] Patient Account Number: [id number] Date of Birth/Sex: Treating RN: Dec 03, 1970 (49 y.o. Orvan Falconer Primary Care Travis Mastel: Louanne Skye RTMENT, GUILFO RD Other Clinician: Referring Cozy Veale: Treating Briony Parveen/Extender: Alvy Beal RTMENT, GUILFO RD Weeks in Treatment: 0 Height (in): 62 Weight (lbs): 215 Body Mass Index (BMI): 39.3 Nutrition Risk Screening Items Score Screening NUTRITION RISK SCREEN: I have an illness or condition that made me change the kind and/or amount of food I eat 0 No I eat fewer than two meals per day 0 No I eat few fruits and vegetables, or milk products 0 No I have three or more  drinks of beer, liquor or wine almost every day 0 No I have tooth or mouth problems that make it hard for me to eat 0 No I don't always have enough money to buy the food I need 0 No I eat alone most of the time 0 No I take three or more different prescribed or over-the-counter drugs a day 1 Yes Without wanting to, I have lost or gained 10 pounds in the last six months 0 No I am not always physically able to shop, cook and/or feed myself 0 No Nutrition Protocols Good Risk Protocol 0 No interventions needed Moderate Risk Protocol High Risk Proctocol Risk Level: Good Risk Score: 1 Electronic Signature(s) Signed: 01/24/2020 5:23:39 PM By: Carlene Coria RN Entered By: Carlene Coria on 01/24/2020 08:12:08

## 2020-01-24 NOTE — Progress Notes (Signed)
Alexandria Meyer, Alexandria Meyer ([id number]) Visit Report for 01/24/2020 Allergy List Details Patient Name: Date of Service: Alexandria Meyer, Alexandria Meyer. 01/24/2020 7:30 A M Medical Record Number: [id number] Patient Account Number: [id number] Date of Birth/Sex: Treating RN: 1970/10/19 (49 y.o. Orvan Falconer Primary Care Murad Staples: Carron Curie, GUILFO RD Other Clinician: Referring Travanti Mcmanus: Treating Russie Gulledge/Extender: Alvy Beal RTMENT, GUILFO RD Weeks in Treatment: 0 Allergies Active Allergies penicillin lisinopril Allergy Notes Electronic Signature(s) Signed: 01/24/2020 5:23:39 PM By: Carlene Coria RN Entered By: Carlene Coria on 01/24/2020 08:03:06 -------------------------------------------------------------------------------- Arrival Information Details Patient Name: Date of Service: Alexandria Reap M. 01/24/2020 7:30 A M Medical Record Number: [id number] Patient Account Number: [id number] Date of Birth/Sex: Treating RN: 05/05/1970 (49 y.o. Orvan Falconer Primary Care Saleema Weppler: Carron Curie, GUILFO RD Other Clinician: Referring Lynett Brasil: Treating Keileigh Vahey/Extender: Alvy Beal RTMENT, GUILFO RD Weeks in Treatment: 0 Visit Information Patient Arrived: Ambulatory Arrival Time: 08:01 Accompanied By: self Transfer Assistance: None Patient Identification Verified: Yes Secondary Verification Process Completed: Yes Patient Requires Transmission-Based Precautions: No Patient Has Alerts: No Electronic Signature(s) Signed: 01/24/2020 5:23:39 PM By: Carlene Coria RN Entered By: Carlene Coria on 01/24/2020 08:02:11 -------------------------------------------------------------------------------- Clinic Level of Care Assessment Details Patient Name: Date of Service: Alexandria ODETTE, WATANABE. 01/24/2020 7:30 A M Medical Record Number: [id number] Patient Account Number: [id number] Date of Birth/Sex: Treating RN: Feb 05, 1971 (49 y.o. Nancy Fetter Primary Care Abbiegail Landgren: Carron Curie, GUILFO  RD Other Clinician: Referring Justn Quale: Treating Cherye Gaertner/Extender: Alvy Beal RTMENT, GUILFO RD Weeks in Treatment: 0 Clinic Level of Care Assessment Items TOOL 2 Quantity Score X- 1 0 Use when only an EandM is performed on the INITIAL visit ASSESSMENTS - Nursing Assessment / Reassessment X- 1 20 General Physical Exam (combine w/ comprehensive assessment (listed just below) when performed on new pt. evals) X- 1 25 Comprehensive Assessment (HX, ROS, Risk Assessments, Wounds Hx, etc.) ASSESSMENTS - Wound and Skin A ssessment / Reassessment X - Simple Wound Assessment / Reassessment - one wound 1 5 []  - 0 Complex Wound Assessment / Reassessment - multiple wounds []  - 0 Dermatologic / Skin Assessment (not related to wound area) ASSESSMENTS - Ostomy and/or Continence Assessment and Care []  - 0 Incontinence Assessment and Management []  - 0 Ostomy Care Assessment and Management (repouching, etc.) PROCESS - Coordination of Care X - Simple Patient / Family Education for ongoing care 1 15 []  - 0 Complex (extensive) Patient / Family Education for ongoing care X- 1 10 Staff obtains Programmer, systems, Records, T Results / Process Orders est []  - 0 Staff telephones HHA, Nursing Homes / Clarify orders / etc []  - 0 Routine Transfer to another Facility (non-emergent condition) []  - 0 Routine Hospital Admission (non-emergent condition) X- 1 15 New Admissions / Biomedical engineer / Ordering NPWT Apligraf, etc. , []  - 0 Emergency Hospital Admission (emergent condition) X- 1 10 Simple Discharge Coordination []  - 0 Complex (extensive) Discharge Coordination PROCESS - Special Needs []  - 0 Pediatric / Minor Patient Management []  - 0 Isolation Patient Management []  - 0 Hearing / Language / Visual special needs []  - 0 Assessment of Community assistance (transportation, D/C planning, etc.) []  - 0 Additional assistance / Altered mentation []  - 0 Support Surface(s) Assessment  (bed, cushion, seat, etc.) INTERVENTIONS - Wound Cleansing / Measurement X- 1 5 Wound Imaging (photographs - any number of wounds) []  - 0 Wound Tracing (instead of photographs) X- 1 5 Simple Wound Measurement - one wound []  - 0 Complex Wound Measurement - multiple wounds  X- 1 5 Simple Wound Cleansing - one wound []  - 0 Complex Wound Cleansing - multiple wounds INTERVENTIONS - Wound Dressings X - Small Wound Dressing one or multiple wounds 1 10 []  - 0 Medium Wound Dressing one or multiple wounds []  - 0 Large Wound Dressing one or multiple wounds []  - 0 Application of Medications - injection INTERVENTIONS - Miscellaneous []  - 0 External ear exam []  - 0 Specimen Collection (cultures, biopsies, blood, body fluids, etc.) []  - 0 Specimen(s) / Culture(s) sent or taken to Lab for analysis []  - 0 Patient Transfer (multiple staff / Civil Service fast streamer / Similar devices) []  - 0 Simple Staple / Suture removal (25 or less) []  - 0 Complex Staple / Suture removal (26 or more) []  - 0 Hypo / Hyperglycemic Management (close monitor of Blood Glucose) []  - 0 Ankle / Brachial Index (ABI) - do not check if billed separately Has the patient been seen at the hospital within the last three years: Yes Total Score: 125 Level Of Care: New/Established - Level 4 Electronic Signature(s) Signed: 01/24/2020 5:46:53 PM By: Levan Hurst RN, BSN Entered By: Levan Hurst on 01/24/2020 08:59:47 -------------------------------------------------------------------------------- Encounter Discharge Information Details Patient Name: Date of Service: Alexandria Mickel Crow M. 01/24/2020 7:30 A M Medical Record Number: [id number] Patient Account Number: [id number] Date of Birth/Sex: Treating RN: 06-15-1970 (49 y.o. Debby Bud Primary Care Virgina Deakins: Carron Curie, GUILFO RD Other Clinician: Referring Kourtnei Rauber: Treating Minahil Quinlivan/Extender: Alvy Beal RTMENT, GUILFO RD Weeks in Treatment: 0 Encounter  Discharge Information Items Discharge Condition: Stable Ambulatory Status: Ambulatory Discharge Destination: Home Transportation: Private Auto Accompanied By: self Schedule Follow-up Appointment: Yes Clinical Summary of Care: Electronic Signature(s) Signed: 01/24/2020 5:32:58 PM By: Deon Pilling Entered By: Deon Pilling on 01/24/2020 09:25:10 -------------------------------------------------------------------------------- Multi-Disciplinary Care Plan Details Patient Name: Date of Service: Alexandria Sakai Mickel Crow M. 01/24/2020 7:30 A M Medical Record Number: [id number] Patient Account Number: [id number] Date of Birth/Sex: Treating RN: 26-Aug-1970 (50 y.o. Nancy Fetter Primary Care Jaz Mallick: Carron Curie, GUILFO RD Other Clinician: Referring Rolene Andrades: Treating Fabricio Endsley/Extender: Alvy Beal RTMENT, GUILFO RD Weeks in Treatment: 0 Active Inactive Nutrition Nursing Diagnoses: Impaired glucose control: actual or potential Potential for alteratiion in Nutrition/Potential for imbalanced nutrition Goals: Patient/caregiver agrees to and verbalizes understanding of need to use nutritional supplements and/or vitamins as prescribed Date Initiated: 01/24/2020 Target Resolution Date: 02/21/2020 Goal Status: Active Patient/caregiver will maintain therapeutic glucose control Date Initiated: 01/24/2020 Target Resolution Date: 02/21/2020 Goal Status: Active Interventions: Assess HgA1c results as ordered upon admission and as needed Assess patient nutrition upon admission and as needed per policy Provide education on elevated blood sugars and impact on wound healing Provide education on nutrition Notes: Wound/Skin Impairment Nursing Diagnoses: Impaired tissue integrity Knowledge deficit related to ulceration/compromised skin integrity Goals: Patient/caregiver will verbalize understanding of skin care regimen Date Initiated: 01/24/2020 Target Resolution Date: 02/21/2020 Goal Status:  Active Ulcer/skin breakdown will have a volume reduction of 30% by week 4 Date Initiated: 01/24/2020 Target Resolution Date: 02/21/2020 Goal Status: Active Interventions: Assess patient/caregiver ability to obtain necessary supplies Assess patient/caregiver ability to perform ulcer/skin care regimen upon admission and as needed Assess ulceration(s) every visit Provide education on ulcer and skin care Notes: Electronic Signature(s) Signed: 01/24/2020 5:46:53 PM By: Levan Hurst RN, BSN Entered By: Levan Hurst on 01/24/2020 08:45:37 -------------------------------------------------------------------------------- Pain Assessment Details Patient Name: Date of Service: Alexandria Reap M. 01/24/2020 7:30 A M Medical Record Number: [id number] Patient Account Number: [id number] Date of Birth/Sex: Treating RN: 1970-07-05 (49  y.o. Orvan Falconer Primary Care Jeovany Huitron: Carron Curie, GUILFO RD Other Clinician: Referring Syesha Thaw: Treating Jolin Benavides/Extender: Alvy Beal RTMENT, GUILFO RD Weeks in Treatment: 0 Active Problems Location of Pain Severity and Description of Pain Patient Has Paino Yes Site Locations With Dressing Change: Yes Duration of the Pain. Constant / Intermittento Constant Rate the pain. Current Pain Level: 8 Worst Pain Level: 10 Least Pain Level: 7 Tolerable Pain Level: 5 Character of Pain Describe the Pain: Aching Pain Management and Medication Current Pain Management: Medication: Yes Cold Application: No Rest: Yes Massage: No Activity: No T.E.N.S.: No Heat Application: No Leg drop or elevation: No Is the Current Pain Management Adequate: Inadequate How does your wound impact your activities of daily livingo Sleep: Yes Bathing: No Appetite: No Relationship With Others: No Bladder Continence: No Emotions: No Bowel Continence: No Work: No Toileting: No Drive: No Dressing: No Hobbies: No Electronic Signature(s) Signed: 01/24/2020  5:23:39 PM By: Carlene Coria RN Entered By: Carlene Coria on 01/24/2020 08:27:08 -------------------------------------------------------------------------------- Patient/Caregiver Education Details Patient Name: Date of Service: Alexandria Meyer 12/17/2021andnbsp7:30 A M Medical Record Number: [id number] Patient Account Number: [id number] Date of Birth/Gender: Treating RN: 18-Aug-1970 (49 y.o. Nancy Fetter Primary Care Physician: Carron Curie, GUILFO RD Other Clinician: Referring Physician: Treating Physician/Extender: Alvy Beal RTMENT, GUILFO RD Weeks in Treatment: 0 Education Assessment Education Provided To: Patient Education Topics Provided Elevated Blood Sugar/ Impact on Healing: Methods: Explain/Verbal Responses: State content correctly Nutrition: Methods: Explain/Verbal Responses: State content correctly Wound/Skin Impairment: Methods: Explain/Verbal Responses: State content correctly Electronic Signature(s) Signed: 01/24/2020 5:46:53 PM By: Levan Hurst RN, BSN Entered By: Levan Hurst on 01/24/2020 08:49:37 -------------------------------------------------------------------------------- Wound Assessment Details Patient Name: Date of Service: Alexandria Reap M. 01/24/2020 7:30 A M Medical Record Number: [id number] Patient Account Number: [id number] Date of Birth/Sex: Treating RN: [birth year] (49 y.o. Nancy Fetter Primary Care Shakthi Scipio: Carron Curie, GUILFO RD Other Clinician: Referring Blake Goya: Treating Mckennon Zwart/Extender: Alvy Beal RTMENT, GUILFO RD Weeks in Treatment: 0 Wound Status Wound Number: 1 Primary Etiology: Abscess Wound Location: Left Upper Arm Wound Status: Open Wounding Event: Gradually Appeared Comorbid History: Hypertension, Type II Diabetes, Scleroderma Date Acquired: 01/03/2020 Weeks Of Treatment: 0 Clustered Wound: No Wound Measurements Length: (cm) 0.5 Width: (cm) 0.5 Depth: (cm) 2 Area: (cm)  0.196 Volume: (cm) 0.393 % Reduction in Area: 0% % Reduction in Volume: 0% Epithelialization: None Tunneling: No Undermining: No Wound Description Classification: Full Thickness Without Exposed Support Structures Exudate Amount: Medium Exudate Type: Purulent Exudate Color: yellow, brown, green Foul Odor After Cleansing: No Slough/Fibrino Yes Wound Bed Granulation Amount: Medium (34-66%) Exposed Structure Granulation Quality: Pink, Pale Fascia Exposed: No Necrotic Amount: Medium (34-66%) Fat Layer (Subcutaneous Tissue) Exposed: Yes Necrotic Quality: Adherent Slough Tendon Exposed: No Muscle Exposed: No Joint Exposed: No Bone Exposed: No Treatment Notes Wound #1 (Upper Arm) Wound Laterality: Left Cleanser Soap and Water Discharge Instruction: May shower and wash wound with dial antibacterial soap and water prior to dressing change. Peri-Wound Care Topical Primary Dressing KerraCel Ag Gelling Fiber Dressing, 0.75x12 Ribbon (silver alginate) Discharge Instruction: Lightly pack into tunneling/undermining Secondary Dressing ComfortFoam Border, 4x4 in (silicone border) Discharge Instruction: Cover with foam border or gauze and tape Secured With Compression Wrap Compression Stockings Add-Ons Notes Educated patient on how to apply dressing, frequency of change, how to care for wound, and when to return wound center. Electronic Signature(s) Signed: 01/24/2020 5:46:53 PM By: Levan Hurst RN, BSN Entered By: Levan Hurst on 01/24/2020 09:06:28 -------------------------------------------------------------------------------- Vitals Details  Patient Name: Date of Service: Alexandria Meyer, Alexandria Meyer. 01/24/2020 7:30 A M Medical Record Number: [id number] Patient Account Number: [id number] Date of Birth/Sex: Treating RN: 30-Dec-1970 (49 y.o. Orvan Falconer Primary Care Rei Contee: Carron Curie, GUILFO RD Other Clinician: Referring Gwendelyn Lanting: Treating Arneisha Kincannon/Extender: Alvy Beal RTMENT, GUILFO RD Weeks in Treatment: 0 Vital Signs Time Taken: 08:02 Temperature (F): 98.2 Height (in): 62 Pulse (bpm): 73 Source: Stated Respiratory Rate (breaths/min): 20 Weight (lbs): 215 Blood Pressure (mmHg): 137/68 Source: Stated Reference Range: 80 - 120 mg / dl Body Mass Index (BMI): 39.3 Electronic Signature(s) Signed: 01/24/2020 5:23:39 PM By: Carlene Coria RN Entered By: Carlene Coria on 01/24/2020 08:02:36

## 2020-01-24 NOTE — Progress Notes (Signed)
Alexandria Meyer, Alexandria Meyer ([id number]) Visit Report for 01/24/2020 Chief Complaint Document Details Patient Name: Date of Service: HA Alexandria Meyer, Alexandria Meyer. 01/24/2020 7:30 A M Medical Record Number: [id number] Patient Account Number: [id number] Date of Birth/Sex: Treating RN: 1970/05/15 (49 y.o. Nancy Fetter Primary Care Provider: Carron Curie, GUILFO RD Other Clinician: Referring Provider: Treating Provider/Extender: Alvy Beal RTMENT, GUILFO RD Weeks in Treatment: 0 Information Obtained from: Patient Chief Complaint 01/24/2020; patient is here for review of a draining wound in the left deltoid area Electronic Signature(s) Signed: 01/24/2020 4:58:57 PM By: Linton Ham MD Entered By: Linton Ham on 01/24/2020 09:07:22 -------------------------------------------------------------------------------- HPI Details Patient Name: Date of Service: Alexandria Reap M. 01/24/2020 7:30 A M Medical Record Number: [id number] Patient Account Number: [id number] Date of Birth/Sex: Treating RN: 1970-05-22 (49 y.o. Nancy Fetter Primary Care Provider: Carron Curie, GUILFO RD Other Clinician: Referring Provider: Treating Provider/Extender: Alvy Beal RTMENT, GUILFO RD Weeks in Treatment: 0 History of Present Illness HPI Description: ADMISSION 01/24/2020 This is a 49 year old woman with type 2 diabetes although in some parts of the epic notes she is listed as a type I diabetic. She is referred here I believe from her primary care provider for a draining wound on the left deltoid area. This apparently started sometime last month as an itchy area that she scratched. It is gradually expanded. She has been to urgent care on 11/26 and several times subsequently. The area was IandD but I do not see the cultures were done in the urgent care. She was initially put on Bactrim and doxycycline but she did not tolerate this. She subsequently went back to primary care at [facility] There is a relatively complete note from them dated 12/1. Apparently a culture was done at this time although I do not see that specific result. She was put on oral vancomycin 250 mg 3 times daily the doxycycline and sulfa antibiotic were stopped apparently the doxycycline causing nausea and the sulfa antibiotic would have been "ineffective" is what the patient remembers being told. 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 The patient describes a lot of pain rating 7-8 out of 10 she also has limitation of range of the shoulder although some of this may be chronic. Past medical history type 2 diabetes on insulin, hypertension, psoriasis, history of abscesses Electronic Signature(s) Signed: 01/24/2020 4:58:57 PM By: Linton Ham MD Entered By: Linton Ham on 01/24/2020 09:11:22 -------------------------------------------------------------------------------- Physical Exam Details Patient Name: Date of Service: Alexandria Reap M. 01/24/2020 7:30 A M Medical Record Number: [id number] Patient Account Number: [id number] Date of Birth/Sex: Treating RN: 1970/07/27 (49 y.o. Nancy Fetter Primary Care Provider: Carron Curie, GUILFO RD Other Clinician: Referring Provider: Treating Provider/Extender: Alvy Beal RTMENT, GUILFO RD Weeks in Treatment: 0 Constitutional Sitting or standing Blood Pressure is within target range for patient.. Pulse regular and within target range for patient.Marland Kitchen Respirations regular, non-labored and within target range.. Temperature is normal and within the target range for the patient.Marland Kitchen Appears in no distress. She does not look systemically unwell but is uncomfortable. Respiratory work of breathing is normal. Musculoskeletal ShoulderShe has limitation of range especially abduction and flexion. There may be a minimal effusion in the left shoulder but no specific joint line  tenderness. Psychiatric appears at normal baseline. Notes Wound exam; the area in question is on the mid part of the left deltoid. This is a small opening but has deep  penetration and undermining. Purulent sanguinous drainage which I have obtained for culture I do not feel bone. There is surrounding induration that is very tender Electronic Signature(s) Signed: 01/24/2020 4:58:57 PM By: Linton Ham MD Entered By: Linton Ham on 01/24/2020 09:13:12 -------------------------------------------------------------------------------- Physician Orders Details Patient Name: Date of Service: Alexandria Reap M. 01/24/2020 7:30 A M Medical Record Number: [id number] Patient Account Number: [id number] Date of Birth/Sex: Treating RN: [birth year] (49 y.o. Nancy Fetter Primary Care Provider: Carron Curie, GUILFO RD Other Clinician: Referring Provider: Treating Provider/Extender: Alvy Beal RTMENT, GUILFO RD Weeks in Treatment: 0 Verbal / Phone Orders: No Diagnosis Coding Follow-up Appointments ppointment in 1 week. - Thursday 12/23 Return A Bathing/ Shower/ Hygiene May shower and wash wound with soap and water. - on days dressing is changed Wound Treatment Wound #1 - Upper Arm Wound Laterality: Left Cleanser: Soap and Water 1 x Per Day/7 Days Discharge Instructions: May shower and wash wound with dial antibacterial soap and water prior to dressing change. Prim Dressing: KerraCel Ag Gelling Fiber Dressing, 0.75x12 Ribbon (silver alginate) ary 1 x Per Day/7 Days Discharge Instructions: Lightly pack into tunneling/undermining Secondary Dressing: ComfortFoam Border, 4x4 in (silicone border) 1 x Per Day/7 Days Discharge Instructions: Cover with foam border or gauze and tape Laboratory naerobe culture (MICRO) - Left upper arm Bacteria identified in Unspecified specimen by A LOINC Code: 474-2 Convenience Name: Anerobic culture Radiology X-ray, left upper humerus and shoulder -  Non healing wound on left upper arm, pain, possible abscess Patient Medications llergies: penicillin, lisinopril A Notifications Medication Indication Start End abscess hx of mrsa 01/24/2020 clindamycin HCl DOSE oral 150 mg capsule - 3 capsules oral (450mg  tid) for 7days Electronic Signature(s) Signed: 01/24/2020 9:51:41 AM By: Linton Ham MD Entered By: Linton Ham on 01/24/2020 09:51:40 Prescription 01/24/2020 -------------------------------------------------------------------------------- Rosealee Albee. Linton Ham MD Patient Name: Provider: 09-Jan-1971 [id number] Date of Birth: NPI#: [id number] Sex: DEA #: [phone] [id number] Phone #: License #: Teller Patient Address: [address] [place], [place] [address] [place], [place] [address] [phone] Allergies penicillin; lisinopril Provider's Orders X-ray, left upper humerus and shoulder - Non healing wound on left upper arm, pain, possible abscess Hand Signature: Date(s): Electronic Signature(s) Signed: 01/24/2020 4:58:57 PM By: Linton Ham MD Entered By: Linton Ham on 01/24/2020 09:51:41 -------------------------------------------------------------------------------- Problem List Details Patient Name: Date of Service: Alexandria Reap M. 01/24/2020 7:30 A M Medical Record Number: [id number] Patient Account Number: [id number] Date of Birth/Sex: Treating RN: October 06, 1970 (49 y.o. Nancy Fetter Primary Care Provider: Carron Curie, GUILFO RD Other Clinician: Referring Provider: Treating Provider/Extender: Alvy Beal RTMENT, GUILFO RD Weeks in Treatment: 0 Active Problems ICD-10 Encounter Code Description Active Date MDM Diagnosis L02.818 Cutaneous abscess of other sites 01/24/2020 No Yes L98.498 Non-pressure chronic ulcer of skin of other sites with other specified severity 01/24/2020 No Yes Z86.14  Personal history of Methicillin resistant Staphylococcus aureus infection 01/24/2020 No Yes M25.512 Pain in left shoulder 01/24/2020 No Yes Inactive Problems Resolved Problems Electronic Signature(s) Signed: 01/24/2020 4:58:57 PM By: Linton Ham MD Entered By: Linton Ham on 01/24/2020 09:04:39 -------------------------------------------------------------------------------- Progress Note Details Patient Name: Date of Service: Alexandria Reap M. 01/24/2020 7:30 A M Medical Record Number: [id number] Patient Account Number: [id number] Date of Birth/Sex: Treating RN: [birth year] (49 y.o. Nancy Fetter Primary Care Provider: Carron Curie, GUILFO RD Other Clinician: Referring Provider: Treating Provider/Extender: Alvy Beal RTMENT, GUILFO RD [place]  in Treatment: 0 Subjective Chief Complaint Information obtained from Patient 01/24/2020; patient is here for review of a draining wound in the left deltoid area History of Present Illness (HPI) ADMISSION 01/24/2020 This is a 49 year old woman with type 2 diabetes although in some parts of the epic notes she is listed as a type I diabetic. She is referred here I believe from her primary care provider for a draining wound on the left deltoid area. This apparently started sometime last month as an itchy area that she scratched. It is gradually expanded. She has been to urgent care on 11/26 and several times subsequently. The area was IandD but I do not see the cultures were done in the urgent care. She was initially put on Bactrim and doxycycline but she did not tolerate this. She subsequently went back to primary care at [facility] There is a relatively complete note from them dated 12/1. Apparently a culture was done at this time although I do not see that specific result. She was put on oral vancomycin 250 mg 3 times daily the doxycycline and sulfa antibiotic were stopped apparently the doxycycline causing nausea and  the sulfa antibiotic would have been "ineffective" is what the patient remembers being told. 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 The patient describes a lot of pain rating 7-8 out of 10 she also has limitation of range of the shoulder although some of this may be chronic. Past medical history type 2 diabetes on insulin, hypertension, psoriasis, history of abscesses Patient History Information obtained from Patient. Allergies penicillin, lisinopril Family History Diabetes - Mother,Father,Siblings, Hypertension - Mother,Father,Siblings, Kidney Disease - Mother,Father,Siblings, Seizures - Father, Stroke - Father,Siblings, No family history of Cancer, Heart Disease, Hereditary Spherocytosis, Lung Disease, Thyroid Problems, Tuberculosis. Social History Never smoker, Marital Status - Divorced, Alcohol Use - Never, Drug Use - No History, Caffeine Use - Never. Medical History Eyes Denies history of Cataracts, Glaucoma, Optic Neuritis Ear/Nose/Mouth/Throat Denies history of Chronic sinus problems/congestion, Middle ear problems Hematologic/Lymphatic Denies history of Anemia, Hemophilia, Human Immunodeficiency Virus, Lymphedema, Sickle Cell Disease Respiratory Denies history of Aspiration, Asthma, Chronic Obstructive Pulmonary Disease (COPD), Pneumothorax, Sleep Apnea, Tuberculosis Cardiovascular Patient has history of Hypertension Denies history of Angina, Arrhythmia, Congestive Heart Failure, Coronary Artery Disease, Deep Vein Thrombosis, Hypotension, Myocardial Infarction, Peripheral Arterial Disease, Peripheral Venous Disease, Phlebitis, Vasculitis Gastrointestinal Denies history of Cirrhosis , Colitis, Crohnoos, Hepatitis A, Hepatitis B, Hepatitis C Endocrine Patient has history of Type II Diabetes Denies history of Type I Diabetes Immunological Patient has history of Scleroderma Denies history of Lupus Erythematosus,  Raynaudoos Integumentary (Skin) Denies history of History of Burn Musculoskeletal Denies history of Gout, Rheumatoid Arthritis, Osteoarthritis, Osteomyelitis Neurologic Denies history of Dementia, Neuropathy, Quadriplegia, Paraplegia, Seizure Disorder Oncologic Denies history of Received Chemotherapy, Received Radiation Psychiatric Denies history of Anorexia/bulimia, Confinement Anxiety Patient is treated with Insulin. Blood sugar is not tested. Review of Systems (ROS) Constitutional Symptoms (General Health) Denies complaints or symptoms of Fatigue, Fever, Chills, Marked Weight Change. Eyes Complains or has symptoms of Glasses / Contacts. Denies complaints or symptoms of Dry Eyes, Vision Changes. Ear/Nose/Mouth/Throat Denies complaints or symptoms of Chronic sinus problems or rhinitis. Respiratory Denies complaints or symptoms of Chronic or frequent coughs, Shortness of Breath. Cardiovascular Denies complaints or symptoms of Chest pain. Gastrointestinal Denies complaints or symptoms of Frequent diarrhea, Nausea, Vomiting. Endocrine Denies complaints or symptoms of Heat/cold intolerance. Genitourinary Denies complaints or symptoms of Frequent  urination. Integumentary (Skin) Complains or has symptoms of Wounds. Musculoskeletal Denies complaints or symptoms of Muscle Pain, Muscle Weakness. Neurologic Denies complaints or symptoms of Numbness/parasthesias. Psychiatric Denies complaints or symptoms of Claustrophobia, Suicidal. Objective Constitutional Sitting or standing Blood Pressure is within target range for patient.. Pulse regular and within target range for patient.Marland Kitchen Respirations regular, non-labored and within target range.. Temperature is normal and within the target range for the patient.Marland Kitchen Appears in no distress. She does not look systemically unwell but is uncomfortable. Vitals Time Taken: 8:02 AM, Height: 62 in, Source: Stated, Weight: 215 lbs, Source: Stated, BMI:  39.3, Temperature: 98.2 F, Pulse: 73 bpm, Respiratory Rate: 20 breaths/min, Blood Pressure: 137/68 mmHg. Respiratory work of breathing is normal. Musculoskeletal ShoulderShe has limitation of range especially abduction and flexion. There may be a minimal effusion in the left shoulder but no specific joint line tenderness. Psychiatric appears at normal baseline. General Notes: Wound exam; the area in question is on the mid part of the left deltoid. This is a small opening but has deep penetration and undermining. Purulent sanguinous drainage which I have obtained for culture I do not feel bone. There is surrounding induration that is very tender Integumentary (Hair, Skin) Wound #1 status is Open. Original cause of wound was Gradually Appeared. The wound is located on the Left Upper Arm. The wound measures 0.5cm length x 0.5cm width x 2cm depth; 0.196cm^2 area and 0.393cm^3 volume. There is Fat Layer (Subcutaneous Tissue) exposed. There is no tunneling or undermining noted. There is a medium amount of purulent drainage noted. There is medium (34-66%) pink, pale granulation within the wound bed. There is a medium (34- 66%) amount of necrotic tissue within the wound bed including Adherent Slough. 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 - Thursday 12/23 Bathing/ Shower/ Hygiene: May shower and wash wound with soap and water. - on days dressing is changed Laboratory ordered were: Anerobic culture - Left upper arm Radiology ordered were: X-ray, left upper humerus and shoulder - Non healing wound on left upper arm, pain, possible abscess The following medication(s) was prescribed: clindamycin HCl oral 150 mg capsule 3 capsules oral  (450mg  tid) for 7days for abscess hx of mrsa starting 01/24/2020 WOUND #1: - Upper Arm Wound Laterality: Left Cleanser: Soap and Water 1 x Per Day/7 Days Discharge Instructions: May shower and wash wound with dial antibacterial soap and water prior to dressing change. Prim Dressing: KerraCel Ag Gelling Fiber Dressing, 0.75x12 Ribbon (silver alginate) 1 x Per Day/7 Days ary Discharge Instructions: Lightly pack into tunneling/undermining Secondary Dressing: ComfortFoam Border, 4x4 in (silicone border) 1 x Per Day/7 Days Discharge Instructions: Cover with foam border or gauze and tape 1. Abscess on the left shoulder. This is substantial in itself with surrounding induration undermining. The use of oral vancomycin of course is limited by the almost complete absence of meaningful systemic absorption. She says that she only started this 3 days after was prescribed because the pharmacy had trouble getting it. She does not describe anything that would suggest she has pseudomembranous colitis or staph enterocolitis 2. At this point I am going to try to get the culture result that was done in primary care at the beginning of the month. She is allergic to penicillin, did not tolerate doxycycline and was told that Bactrim  would not be effectiveo Resistant. She may need linezolid 3. I am somewhat concerned about the status of her left shoulder and whether there is more of a significant infection here that otherwise would meet ERI. She has limitation of range which is painful although talking to her some of this may be chronic along the joint line there may be minimal fluid but I did not get the sense there was a lot. I am going to x-ray the left upper humerus and shoulder looking for evidence of soft tissue gas which would dictate a more ominous situation. 4. I will try to get her antibiotics in later this afternoon. I was going to give her trimethoprim sulfamethoxazole however I am concerned about the  resistance statement the patient made. We are contacting her primary office to get the culture result. I suspect she may need light Linezolid of course which no doubt will require a prior authorization from some Medicaid office 5. I cannot rule out this patient requiring an MRI of the shoulder however I am not going to try to arrange this today. I told her if she becomes febrile or systemically ill that she will need to go back to the emergency room. I spent 45 minutes in review of this patient's past medical history, face-to-face evaluation and preparation of this record. I will be prescribing some antibiotic today if we can get more information. Addendum as it turns out the culture from 01/09/2020 grew a light growth of Prevotella which I am assuming is an anaerobe. This was beta-lactamase negative. There was no growth of staph beta-hemolytic strep or Pseudomonas. I have put her in for clindamycin 450 mg 3 times daily which should cover anaerobes and MRSA. Our culture should be back by early next week which I can tweak as necessary. I have told her to stop her oral vancomycin Electronic Signature(s) Signed: 01/24/2020 4:58:57 PM By: Linton Ham MD Previous Signature: 01/24/2020 9:52:48 AM Version By: Linton Ham MD Entered By: Linton Ham on 01/24/2020 09:53:40 -------------------------------------------------------------------------------- HxROS Details Patient Name: Date of Service: Alexandria Reap M. 01/24/2020 7:30 A M Medical Record Number: [id number] Patient Account Number: [id number] Date of Birth/Sex: Treating RN: 1970-12-21 (49 y.o. Orvan Falconer Primary Care Provider: Carron Curie, GUILFO RD Other Clinician: Referring Provider: Treating Provider/Extender: Alvy Beal RTMENT, GUILFO RD Weeks in Treatment: 0 Information Obtained From Patient Constitutional Symptoms (General Health) Complaints and Symptoms: Negative for: Fatigue; Fever; Chills; Marked  Weight Change Eyes Complaints and Symptoms: Positive for: Glasses / Contacts Negative for: Dry Eyes; Vision Changes Medical History: Negative for: Cataracts; Glaucoma; Optic Neuritis Ear/Nose/Mouth/Throat Complaints and Symptoms: Negative for: Chronic sinus problems or rhinitis Medical History: Negative for: Chronic sinus problems/congestion; Middle ear problems Respiratory Complaints and Symptoms: Negative for: Chronic or frequent coughs; Shortness of Breath Medical History: Negative for: Aspiration; Asthma; Chronic Obstructive Pulmonary Disease (COPD); Pneumothorax; Sleep Apnea; Tuberculosis Cardiovascular Complaints and Symptoms: Negative for: Chest pain Medical History: Positive for: Hypertension Negative for: Angina; Arrhythmia; Congestive Heart Failure; Coronary Artery Disease; Deep Vein Thrombosis; Hypotension; Myocardial Infarction; Peripheral Arterial Disease; Peripheral Venous Disease; Phlebitis; Vasculitis Gastrointestinal Complaints and Symptoms: Negative for: Frequent diarrhea; Nausea; Vomiting Medical History: Negative for: Cirrhosis ; Colitis; Crohns; Hepatitis A; Hepatitis B; Hepatitis C Endocrine Complaints and Symptoms: Negative for: Heat/cold intolerance Medical History: Positive for: Type II Diabetes Negative for: Type I Diabetes Time with diabetes: 10 Treated with: Insulin Blood sugar tested every day: No Genitourinary Complaints and Symptoms: Negative for: Frequent urination Integumentary (Skin) Complaints  and Symptoms: Positive for: Wounds Medical History: Negative for: History of Burn Musculoskeletal Complaints and Symptoms: Negative for: Muscle Pain; Muscle Weakness Medical History: Negative for: Gout; Rheumatoid Arthritis; Osteoarthritis; Osteomyelitis Neurologic Complaints and Symptoms: Negative for: Numbness/parasthesias Medical History: Negative for: Dementia; Neuropathy; Quadriplegia; Paraplegia; Seizure  Disorder Psychiatric Complaints and Symptoms: Negative for: Claustrophobia; Suicidal Medical History: Negative for: Anorexia/bulimia; Confinement Anxiety Hematologic/Lymphatic Medical History: Negative for: Anemia; Hemophilia; Human Immunodeficiency Virus; Lymphedema; Sickle Cell Disease Immunological Medical History: Positive for: Scleroderma Negative for: Lupus Erythematosus; Raynauds Oncologic Medical History: Negative for: Received Chemotherapy; Received Radiation Immunizations Pneumococcal Vaccine: Received Pneumococcal Vaccination: No Implantable Devices None Family and Social History Cancer: No; Diabetes: Yes - Mother,Father,Siblings; Heart Disease: No; Hereditary Spherocytosis: No; Hypertension: Yes - Mother,Father,Siblings; Kidney Disease: Yes - Mother,Father,Siblings; Lung Disease: No; Seizures: Yes - Father; Stroke: Yes - Father,Siblings; Thyroid Problems: No; Tuberculosis: No; Never smoker; Marital Status - Divorced; Alcohol Use: Never; Drug Use: No History; Caffeine Use: Never; Financial Concerns: No; Food, Clothing or Shelter Needs: No; Support System Lacking: No; Transportation Concerns: No Electronic Signature(s) Signed: 01/24/2020 4:58:57 PM By: Linton Ham MD Signed: 01/24/2020 5:23:39 PM By: Carlene Coria RN Entered By: Carlene Coria on 01/24/2020 08:09:00 -------------------------------------------------------------------------------- [place] Details Patient Name: Date of Service: Alexandria Reap M. 01/24/2020 Medical Record Number: [id number] Patient Account Number: [id number] Date of Birth/Sex: Treating RN: January 30, 1971 (49 y.o. Nancy Fetter Primary Care Provider: Carron Curie, GUILFO RD Other Clinician: Referring Provider: Treating Provider/Extender: Alvy Beal RTMENT, GUILFO RD Weeks in Treatment: 0 Diagnosis Coding ICD-10 Codes Code Description L02.818 Cutaneous abscess of other sites L98.498 Non-pressure chronic ulcer of skin  of other sites with other specified severity Z86.14 Personal history of Methicillin resistant Staphylococcus aureus infection M25.512 Pain in left shoulder Facility Procedures CPT4 Code: [id number] Description: 99214 - WOUND CARE VISIT-LEV 4 EST PT Modifier: Quantity: 1 Physician Procedures : CPT4 Code Description Modifier [id number] - WC PHYS LEVEL 4 - NEW PT ICD-10 Diagnosis Description L02.818 Cutaneous abscess of other sites L98.498 Non-pressure chronic ulcer of skin of other sites with other specified severity Z86.14 Personal  history of Methicillin resistant Staphylococcus aureus infection M25.512 Pain in left shoulder Quantity: 1 Electronic Signature(s) Signed: 01/24/2020 4:58:57 PM By: Linton Ham MD Entered By: Linton Ham on 01/24/2020 09:17:35

## 2020-01-27 LAB — AEROBIC CULTURE W GRAM STAIN (SUPERFICIAL SPECIMEN)

## 2020-01-30 ENCOUNTER — Other Ambulatory Visit: Payer: Self-pay

## 2020-01-30 ENCOUNTER — Encounter (HOSPITAL_BASED_OUTPATIENT_CLINIC_OR_DEPARTMENT_OTHER): Payer: Medicaid Other | Admitting: Internal Medicine

## 2020-01-30 DIAGNOSIS — E11622 Type 2 diabetes mellitus with other skin ulcer: Secondary | ICD-10-CM | POA: Diagnosis not present

## 2020-01-30 NOTE — Progress Notes (Signed)
Meyer, Alexandria ([id number]) Visit Report for 01/30/2020 Arrival Information Details Patient Name: Date of Service: HA Alexandria, Meyer. 01/30/2020 9:00 A M Medical Record Number: [id number] Patient Account Number: [id number] Date of Birth/Sex: Treating RN: 05-11-1970 (49 y.o. Alexandria Meyer Primary Care Maison Agrusa: Carron Curie, GUILFO RD Other Clinician: Referring Chazz Philson: Treating Tyeasha Ebbs/Extender: Alexandria Meyer RTMENT, GUILFO RD Weeks in Treatment: 0 Visit Information History Since Last Visit All ordered tests and consults were completed: No Patient Arrived: Ambulatory Added or deleted any medications: No Arrival Time: 09:06 Any new allergies or adverse reactions: No Accompanied By: self Had a fall or experienced change in No Transfer Assistance: None activities of daily living that may affect Patient Identification Verified: Yes risk of falls: Secondary Verification Process Completed: Yes Signs or symptoms of abuse/neglect since last visito No Patient Requires Transmission-Based Precautions: No Hospitalized since last visit: No Patient Has Alerts: No Implantable device outside of the clinic excluding No cellular tissue based products placed in the center since last visit: Has Dressing in Place as Prescribed: Yes Pain Present Now: No Electronic Signature(s) Signed: 01/30/2020 3:12:05 PM By: Carlene Coria RN Entered By: Carlene Coria on 01/30/2020 09:07:26 -------------------------------------------------------------------------------- Clinic Level of Care Assessment Details Patient Name: Date of Service: HA Alexandria, Meyer. 01/30/2020 9:00 A M Medical Record Number: [id number] Patient Account Number: [id number] Date of Birth/Sex: Treating RN: 1970/04/22 (49 y.o. Alexandria Meyer, Alexandria.Meyer Primary Care Fidela Cieslak: Louanne Skye RTMENT, GUILFO RD Other Clinician: Referring Kyree Adriano: Treating Mardella Nuckles/Extender: Alexandria Meyer RTMENT, GUILFO RD Weeks in Treatment: 0 Clinic Level  of Care Assessment Items TOOL 4 Quantity Score X- 1 0 Use when only an EandM is performed on FOLLOW-UP visit ASSESSMENTS - Nursing Assessment / Reassessment X- 1 10 Reassessment of Co-morbidities (includes updates in patient status) X- 1 5 Reassessment of Adherence to Treatment Plan ASSESSMENTS - Wound and Skin A ssessment / Reassessment X - Simple Wound Assessment / Reassessment - one wound 1 5 []  - 0 Complex Wound Assessment / Reassessment - multiple wounds X- 1 10 Dermatologic / Skin Assessment (not related to wound area) ASSESSMENTS - Focused Assessment []  - 0 Circumferential Edema Measurements - multi extremities X- 1 10 Nutritional Assessment / Counseling / Intervention []  - 0 Lower Extremity Assessment (monofilament, tuning fork, pulses) []  - 0 Peripheral Arterial Disease Assessment (using hand held doppler) ASSESSMENTS - Ostomy and/or Continence Assessment and Care []  - 0 Incontinence Assessment and Management []  - 0 Ostomy Care Assessment and Management (repouching, etc.) PROCESS - Coordination of Care X - Simple Patient / Family Education for ongoing care 1 15 []  - 0 Complex (extensive) Patient / Family Education for ongoing care X- 1 10 Staff obtains Programmer, systems, Records, T Results / Process Orders est []  - 0 Staff telephones HHA, Nursing Homes / Clarify orders / etc []  - 0 Routine Transfer to another Facility (non-emergent condition) []  - 0 Routine Hospital Admission (non-emergent condition) []  - 0 New Admissions / Biomedical engineer / Ordering NPWT Apligraf, etc. , []  - 0 Emergency Hospital Admission (emergent condition) X- 1 10 Simple Discharge Coordination []  - 0 Complex (extensive) Discharge Coordination PROCESS - Special Needs []  - 0 Pediatric / Minor Patient Management []  - 0 Isolation Patient Management []  - 0 Hearing / Language / Visual special needs []  - 0 Assessment of Community assistance (transportation, D/C planning, etc.) []  -  0 Additional assistance / Altered mentation []  - 0 Support Surface(s) Assessment (bed, cushion, seat, etc.) INTERVENTIONS - Wound Cleansing / Measurement X -  Simple Wound Cleansing - one wound 1 5 []  - 0 Complex Wound Cleansing - multiple wounds X- 1 5 Wound Imaging (photographs - any number of wounds) []  - 0 Wound Tracing (instead of photographs) X- 1 5 Simple Wound Measurement - one wound []  - 0 Complex Wound Measurement - multiple wounds INTERVENTIONS - Wound Dressings X - Small Wound Dressing one or multiple wounds 1 10 []  - 0 Medium Wound Dressing one or multiple wounds []  - 0 Large Wound Dressing one or multiple wounds []  - 0 Application of Medications - topical []  - 0 Application of Medications - injection INTERVENTIONS - Miscellaneous []  - 0 External ear exam []  - 0 Specimen Collection (cultures, biopsies, blood, body fluids, etc.) []  - 0 Specimen(s) / Culture(s) sent or taken to Lab for analysis []  - 0 Patient Transfer (multiple staff / Civil Service fast streamer / Similar devices) []  - 0 Simple Staple / Suture removal (25 or less) []  - 0 Complex Staple / Suture removal (26 or more) []  - 0 Hypo / Hyperglycemic Management (close monitor of Blood Glucose) []  - 0 Ankle / Brachial Index (ABI) - do not check if billed separately X- 1 5 Vital Signs Has the patient been seen at the hospital within the last three years: Yes Total Score: 105 Level Of Care: New/Established - Level 3 Electronic Signature(s) Signed: 01/30/2020 4:19:56 PM By: Alexandria Meyer Entered By: Alexandria Meyer on 01/30/2020 10:24:42 -------------------------------------------------------------------------------- Encounter Discharge Information Details Patient Name: Date of Service: HA Mickel Crow M. 01/30/2020 9:00 A M Medical Record Number: [id number] Patient Account Number: [id number] Date of Birth/Sex: Treating RN: 1970-02-12 (49 y.o. Alexandria Meyer Primary Care Torrence Hammack: Carron Curie, GUILFO RD Other  Clinician: Referring Jovan Schickling: Treating Cordarrell Sane/Extender: Alexandria Meyer RTMENT, GUILFO RD Weeks in Treatment: 0 Encounter Discharge Information Items Discharge Condition: Stable Ambulatory Status: Ambulatory Discharge Destination: Home Transportation: Private Auto Accompanied By: self Schedule Follow-up Appointment: Yes Clinical Summary of Care: Patient Declined Electronic Signature(s) Signed: 01/30/2020 3:12:05 PM By: Carlene Coria RN Entered By: Carlene Coria on 01/30/2020 09:44:08 -------------------------------------------------------------------------------- Multi Wound Chart Details Patient Name: Date of Service: [name] 01/30/2020 9:00 A M Medical Record Number: [id number] Patient Account Number: [id number] Date of Birth/Sex: Treating RN: 1970-05-27 (49 y.o. Alexandria Meyer Primary Care Jaceyon Strole: Carron Curie, GUILFO RD Other Clinician: Referring Ewart Carrera: Treating Anuradha Chabot/Extender: Alexandria Meyer RTMENT, GUILFO RD Weeks in Treatment: 0 Vital Signs Height(in): 62 Pulse(bpm): 55 Weight(lbs): 215 Blood Pressure(mmHg): 148/84 Body Mass Index(BMI): 39 Temperature(F): 98.4 Respiratory Rate(breaths/min): 18 Photos: [1:No Photos Left Upper Arm] [N/A:N/A N/A] Wound Location: [1:Gradually Appeared] [N/A:N/A] Wounding Event: [1:Abscess] [N/A:N/A] Primary Etiology: [1:Hypertension, Type II Diabetes,] [N/A:N/A] Comorbid History: [1:Scleroderma 01/03/2020] [N/A:N/A] Date Acquired: [1:0] [N/A:N/A] Weeks of Treatment: [1:Open] [N/A:N/A] Wound Status: [1:0.6x0.6x2.5] [N/A:N/A] Measurements L x W x D (cm) [1:0.283] [N/A:N/A] A (cm) : rea [1:0.707] [N/A:N/A] Volume (cm) : [1:-44.40%] [N/A:N/A] % Reduction in A rea: [1:-79.90%] [N/A:N/A] % Reduction in Volume: [1:12] Starting Position 1 (o'clock): [1:6] Ending Position 1 (o'clock): [1:1] Maximum Distance 1 (cm): [1:Yes] [N/A:N/A] Undermining: [1:Full Thickness Without Exposed]  [N/A:N/A] Classification: [1:Support Structures Medium] [N/A:N/A] Exudate Amount: [1:Purulent] [N/A:N/A] Exudate Type: [1:yellow, brown, green] [N/A:N/A] Exudate Color: [1:Distinct, outline attached] [N/A:N/A] Wound Margin: [1:Large (67-100%)] [N/A:N/A] Granulation Amount: [1:Pink, Pale] [N/A:N/A] Granulation Quality: [1:Small (1-33%)] [N/A:N/A] Necrotic Amount: [1:Fat Layer (Subcutaneous Tissue): Yes N/A] Exposed Structures: [1:Fascia: No Tendon: No Muscle: No Joint: No Bone: No None] [N/A:N/A] Treatment Notes Electronic Signature(s) Signed: 01/30/2020 3:50:51 PM By: Alexandria Ham MD Signed:  01/30/2020 4:19:56 PM By: Alexandria Meyer Entered By: Alexandria Meyer on 01/30/2020 09:36:33 -------------------------------------------------------------------------------- Multi-Disciplinary Care Plan Details Patient Name: Date of Service: HA RAEA, MAGALLON M. 01/30/2020 9:00 A M Medical Record Number: [id number] Patient Account Number: [id number] Date of Birth/Sex: Treating RN: Feb 15, 1970 (49 y.o. Alexandria Meyer, Alexandria.Meyer Primary Care Azaria Stegman: Carron Curie, GUILFO RD Other Clinician: Referring Chianti Goh: Treating Tyjah Hai/Extender: Alexandria Meyer RTMENT, GUILFO RD Weeks in Treatment: 0 Active Inactive Nutrition Nursing Diagnoses: Impaired glucose control: actual or potential Potential for alteratiion in Nutrition/Potential for imbalanced nutrition Goals: Patient/caregiver agrees to and verbalizes understanding of need to use nutritional supplements and/or vitamins as prescribed Date Initiated: 01/24/2020 Target Resolution Date: 02/21/2020 Goal Status: Active Patient/caregiver will maintain therapeutic glucose control Date Initiated: 01/24/2020 Target Resolution Date: 02/21/2020 Goal Status: Active Interventions: Assess HgA1c results as ordered upon admission and as needed Assess patient nutrition upon admission and as needed per policy Provide education on elevated blood sugars and  impact on wound healing Provide education on nutrition Treatment Activities: Education provided on Nutrition : 01/24/2020 Notes: Wound/Skin Impairment Nursing Diagnoses: Impaired tissue integrity Knowledge deficit related to ulceration/compromised skin integrity Goals: Patient/caregiver will verbalize understanding of skin care regimen Date Initiated: 01/24/2020 Target Resolution Date: 02/21/2020 Goal Status: Active Ulcer/skin breakdown will have a volume reduction of 30% by week 4 Date Initiated: 01/24/2020 Target Resolution Date: 02/21/2020 Goal Status: Active Interventions: Assess patient/caregiver ability to obtain necessary supplies Assess patient/caregiver ability to perform ulcer/skin care regimen upon admission and as needed Assess ulceration(s) every visit Provide education on ulcer and skin care Notes: Electronic Signature(s) Signed: 01/30/2020 4:19:56 PM By: Alexandria Meyer Entered By: Alexandria Meyer on 01/30/2020 08:37:34 -------------------------------------------------------------------------------- Pain Assessment Details Patient Name: Date of Service: HA Alexandria, Meyer 01/30/2020 9:00 A M Medical Record Number: [id number] Patient Account Number: [id number] Date of Birth/Sex: Treating RN: [birth year] (49 y.o. Alexandria Meyer Primary Care Lailany Enoch: Carron Curie, GUILFO RD Other Clinician: Referring Dajiah Kooi: Treating Susy Placzek/Extender: Alexandria Meyer RTMENT, GUILFO RD Weeks in Treatment: 0 Active Problems Location of Pain Severity and Description of Pain Patient Has Paino No Site Locations Pain Management and Medication Current Pain Management: Electronic Signature(s) Signed: 01/30/2020 3:12:05 PM By: Carlene Coria RN Entered By: Carlene Coria on 01/30/2020 09:07:50 -------------------------------------------------------------------------------- Patient/Caregiver Education Details Patient Name: Date of Service: HA Alexandria Meyer  12/23/2021andnbsp9:00 A M Medical Record Number: [id number] Patient Account Number: [id number] Date of Birth/Gender: Treating RN: 04-Nov-1970 (49 y.o. Alexandria Meyer Primary Care Physician: Carron Curie, GUILFO RD Other Clinician: Referring Physician: Treating Physician/Extender: Alexandria Meyer RTMENT, GUILFO RD Weeks in Treatment: 0 Education Assessment Education Provided To: Patient Education Topics Provided Elevated Blood Sugar/ Impact on Healing: Handouts: Elevated Blood Sugars: How Do They Affect Wound Healing Methods: Explain/Verbal Responses: Reinforcements needed Electronic Signature(s) Signed: 01/30/2020 4:19:56 PM By: Alexandria Meyer Entered By: Alexandria Meyer on 01/30/2020 08:37:47 -------------------------------------------------------------------------------- Wound Assessment Details Patient Name: Date of Service: HA Alexandria, Meyer. 01/30/2020 9:00 A M Medical Record Number: [id number] Patient Account Number: [id number] Date of Birth/Sex: Treating RN: 1970-11-09 (49 y.o. Alexandria Meyer Primary Care Sostenes Kauffmann: Carron Curie, GUILFO RD Other Clinician: Referring Kerissa Coia: Treating Renwick Asman/Extender: Alexandria Meyer RTMENT, GUILFO RD Weeks in Treatment: 0 Wound Status Wound Number: 1 Primary Etiology: Abscess Wound Location: Left Upper Arm Wound Status: Open Wounding Event: Gradually Appeared Comorbid History: Hypertension, Type II Diabetes, Scleroderma Date Acquired: 01/03/2020 Weeks Of Treatment: 0 Clustered Wound: No Wound Measurements Length: (cm) 0.6 Width: (cm) 0.6 Depth: (cm) 2.5 Area: (cm) 0.283 Volume: (  cm) 0.707 Wound Description Classification: Full Thickness Without Exposed Support Structures Wound Margin: Distinct, outline attached Exudate Amount: Medium Exudate Type: Purulent Exudate Color: yellow, brown, green Foul Odor After Cleansing: N Slough/Fibrino Y % Reduction in Area: -44.4% % Reduction in Volume:  -79.9% Epithelialization: None Tunneling: No Undermining: Yes Starting Position (o'clock): 12 Ending Position (o'clock): 6 Maximum Distance: (cm) 1 o es Wound Bed Granulation Amount: Large (67-100%) Exposed Structure Granulation Quality: Pink, Pale Fascia Exposed: No Necrotic Amount: Small (1-33%) Fat Layer (Subcutaneous Tissue) Exposed: Yes Necrotic Quality: Adherent Slough Tendon Exposed: No Muscle Exposed: No Joint Exposed: No Bone Exposed: No Treatment Notes Wound #1 (Upper Arm) Wound Laterality: Left Cleanser Soap and Water Discharge Instruction: May shower and wash wound with dial antibacterial soap and water prior to dressing change. Peri-Wound Care Topical Primary Dressing KerraCel Ag Gelling Fiber Dressing, 0.75x12 Ribbon (silver alginate) Discharge Instruction: Lightly pack into tunneling/undermining Secondary Dressing ComfortFoam Border, 4x4 in (silicone border) Discharge Instruction: Cover with foam border or gauze and tape Secured With Compression Wrap Compression Stockings Add-Ons Electronic Signature(s) Signed: 01/30/2020 4:19:56 PM By: Alexandria Meyer Entered By: Alexandria Meyer on 01/30/2020 09:31:04 -------------------------------------------------------------------------------- Vitals Details Patient Name: Date of Service: HA Mickel Crow M. 01/30/2020 9:00 A M Medical Record Number: [id number] Patient Account Number: [id number] Date of Birth/Sex: Treating RN: 1970-04-01 (49 y.o. Alexandria Meyer Primary Care Derick Seminara: Carron Curie, GUILFO RD Other Clinician: Referring Kasean Denherder: Treating Gaston Dase/Extender: Alexandria Meyer RTMENT, GUILFO RD Weeks in Treatment: 0 Vital Signs Time Taken: 09:07 Temperature (F): 98.4 Height (in): 62 Pulse (bpm): 77 Weight (lbs): 215 Respiratory Rate (breaths/min): 18 Body Mass Index (BMI): 39.3 Blood Pressure (mmHg): 148/84 Reference Range: 80 - 120 mg / dl Electronic Signature(s) Signed: 01/30/2020  3:12:05 PM By: Carlene Coria RN Signed: 01/30/2020 3:12:05 PM By: Carlene Coria RN Entered By: Carlene Coria on 01/30/2020 09:07:41

## 2020-01-30 NOTE — Progress Notes (Signed)
Alexand[place], Meyer ([id number]) Visit Report for 01/30/2020 HPI Details Patient Name: Date of Service: Alexandria BELZORA, Meyer. 01/30/2020 9:00 A M Medical Record Number: [id number] Patient Account Number: [id number] Date of Birth/Sex: Treating RN: 11-11-1970 (49 y.o. Alexandria Meyer Primary Care Provider: Carron Meyer, GUILFO RD Other Clinician: Referring Provider: Treating Provider/Extender: Alexandria Meyer RTMENT, GUILFO RD Weeks in Treatment: 0 History of Present Illness HPI Description: ADMISSION 01/24/2020 This is a 49 year old woman with type 2 diabetes although in some parts of the epic notes she is listed as a type I diabetic. She is referred here I believe from her primary care provider for a draining wound on the left deltoid area. This apparently started sometime last month as an itchy area that she scratched. It is gradually expanded. She has been to urgent care on 11/26 and several times subsequently. The area was IandD but I do not see the cultures were done in the urgent care. She was initially put on Bactrim and doxycycline but she did not tolerate this. She subsequently went back to primary care at "[facility]". There is a relatively complete note from them dated 12/1. Apparently a culture was done at this time although I do not see that specific result. She was put on oral vancomycin 250 mg 3 times daily the doxycycline and sulfa antibiotic were stopped apparently the doxycycline causing nausea and the sulfa antibiotic would have been "ineffective" is what the patient remembers being told. 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 The patient describes a lot of pain rating 7-8 out of 10 she also has limitation of range of the shoulder although some of this may be chronic. Past medical history type 2 diabetes on insulin, hypertension, psoriasis, history of abscesses 12/23; patient is on clindamycin. Culture I did last  time showed anaerobic bacteria often associated with bite injuries but patient denies any such history. She is tolerating clindamycin well with no side effects in particular diarrhea. There is some improvement in the pain in measurements of the wound but still purulent drainage I was a bit disappointed about this. X-rays of the area were negative. The particular anaerobic bacteria cultured this time was Alexandria Meyer Electronic Signature(s) Signed: 01/30/2020 3:50:51 PM By: Alexandria Ham MD Entered By: Alexandria Meyer on 01/30/2020 09:39:18 -------------------------------------------------------------------------------- Physical Exam Details Patient Name: Date of Service: Alexandria Reap M. 01/30/2020 9:00 A M Medical Record Number: [id number] Patient Account Number: [id number] Date of Birth/Sex: Treating RN: 1970/08/14 (49 y.o. Alexandria Meyer Primary Care Provider: Carron Meyer, GUILFO RD Other Clinician: Referring Provider: Treating Provider/Extender: Alexandria Meyer RTMENT, GUILFO RD Weeks in Treatment: 0 Constitutional Patient is hypertensive.. Pulse regular and within target range for patient.Marland Kitchen Respirations regular, non-labored and within target range.. Temperature is normal and within the target range for the patient.Marland Kitchen Appears in no distress. Notes Wound exam; left deltoid. Direct depth is 2-1/2 cm. Undermining of 1 cm from about 12-6 o'clock. There is still some purulent drainage and still some erythema surrounding this but I think in general things are better there is less pain. Electronic Signature(s) Signed: 01/30/2020 3:50:51 PM By: Alexandria Ham MD Entered By: Alexandria Meyer on 01/30/2020 09:40:12 -------------------------------------------------------------------------------- Physician Orders Details Patient Name: Date of Service: Alexandria Reap M. 01/30/2020 9:00 A M Medical Record Number: [id number] Patient Account Number: [id number] Date of  Birth/Sex: Treating RN: 1970-04-25 (49 y.o. Alexandria Meyer Primary Care Provider: DEPA RTMENT, GUILFO  RD Other Clinician: Referring Provider: Treating Provider/Extender: Alexandria Meyer RTMENT, GUILFO RD Weeks in Treatment: 0 Verbal / Phone Orders: No Diagnosis Coding ICD-10 Coding Code Description L02.818 Cutaneous abscess of other sites L98.498 Non-pressure chronic ulcer of skin of other sites with other specified severity Z86.14 Personal history of Methicillin resistant Staphylococcus aureus infection M25.512 Pain in left shoulder Follow-up Appointments ppointment in 2 weeks. - Thursday Return A Nurse Visit: - Wednesday Bathing/ Shower/ Hygiene May shower and wash wound with soap and water. Additional Orders / Instructions Other: - Pick up additional antibiotics from your pharmacy today. ensure to practice range of motion of left arm and shoulder. Wound Treatment Wound #1 - Upper Arm Wound Laterality: Left Cleanser: Soap and Water Every Other Day/7 Days Discharge Instructions: May shower and wash wound with dial antibacterial soap and water prior to dressing change. Prim Dressing: KerraCel Ag Gelling Fiber Dressing, 0.75x12 Ribbon (silver alginate) ary Every Other Day/7 Days Discharge Instructions: Lightly pack into tunneling/undermining Secondary Dressing: ComfortFoam Border, 4x4 in (silicone border) Every Other Day/7 Days Discharge Instructions: Cover with foam border or gauze and tape Patient Medications llergies: penicillin, lisinopril A Notifications Medication Indication Start End abscess left shoulder 01/30/2020 clindamycin HCl DOSE oral 150 mg capsule - 3 capsules oral tid for a further 10 days Electronic Signature(s) Signed: 01/30/2020 9:43:00 AM By: Alexandria Ham MD Entered By: Alexandria Meyer on 01/30/2020 09:43:00 -------------------------------------------------------------------------------- Problem List Details Patient Name: Date of  Service: Alexandria Reap M. 01/30/2020 9:00 A M Medical Record Number: [id number] Patient Account Number: [id number] Date of Birth/Sex: Treating RN: 12-Jul-1970 (49 y.o. Alexandria Meyer, Meta.Reding Primary Care Provider: Carron Meyer, GUILFO RD Other Clinician: Referring Provider: Treating Provider/Extender: Alexandria Meyer RTMENT, GUILFO RD Weeks in Treatment: 0 Active Problems ICD-10 Encounter Code Description Active Date MDM Diagnosis L02.818 Cutaneous abscess of other sites 01/24/2020 No Yes L98.498 Non-pressure chronic ulcer of skin of other sites with other specified severity 01/24/2020 No Yes Z86.14 Personal history of Methicillin resistant Staphylococcus aureus infection 01/24/2020 No Yes M25.512 Pain in left shoulder 01/24/2020 No Yes Inactive Problems Resolved Problems Electronic Signature(s) Signed: 01/30/2020 3:50:51 PM By: Alexandria Ham MD Entered By: Alexandria Meyer on 01/30/2020 09:36:26 -------------------------------------------------------------------------------- Progress Note Details Patient Name: Date of Service: Alexandria Reap M. 01/30/2020 9:00 A M Medical Record Number: [id number] Patient Account Number: [id number] Date of Birth/Sex: Treating RN: 03-08-1970 (49 y.o. Alexandria Meyer, Meta.Reding Primary Care Provider: Carron Meyer, GUILFO RD Other Clinician: Referring Provider: Treating Provider/Extender: Alexandria Meyer RTMENT, GUILFO RD Weeks in Treatment: 0 Subjective History of Present Illness (HPI) ADMISSION 01/24/2020 This is a 48 year old woman with type 2 diabetes although in some parts of the epic notes she is listed as a type I diabetic. She is referred here I believe from her primary care provider for a draining wound on the left deltoid area. This apparently started sometime last month as an itchy area that she scratched. It is gradually expanded. She has been to urgent care on 11/26 and several times subsequently. The area was IandD but I do not see the  cultures were done in the urgent care. She was initially put on Bactrim and doxycycline but she did not tolerate this. She subsequently went back to primary care at "[facility]". There is a relatively complete note from them dated 12/1. Apparently a culture was done at this time although I do not see that specific result. She was put on oral vancomycin 250 mg 3 times daily the doxycycline and  sulfa antibiotic were stopped apparently the doxycycline causing nausea and the sulfa antibiotic would have been "ineffective" is what the patient remembers being told. 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 The patient describes a lot of pain rating 7-8 out of 10 she also has limitation of range of the shoulder although some of this may be chronic. Past medical history type 2 diabetes on insulin, hypertension, psoriasis, history of abscesses 12/23; patient is on clindamycin. Culture I did last time showed anaerobic bacteria often associated with bite injuries but patient denies any such history. She is tolerating clindamycin well with no side effects in particular diarrhea. There is some improvement in the pain in measurements of the wound but still purulent drainage I was a bit disappointed about this. X-rays of the area were negative. The particular anaerobic bacteria cultured this time was Alexandria Meyer Objective Constitutional Patient is hypertensive.. Pulse regular and within target range for patient.Marland Kitchen Respirations regular, non-labored and within target range.. Temperature is normal and within the target range for the patient.Marland Kitchen Appears in no distress. Vitals Time Taken: 9:07 AM, Height: 62 in, Weight: 215 lbs, BMI: 39.3, Temperature: 98.4 F, Pulse: 77 bpm, Respiratory Rate: 18 breaths/min, Blood Pressure: 148/84 mmHg. General Notes: Wound exam; left deltoid. Direct depth is 2-1/2 cm. Undermining of 1 cm from about 12-6 o'clock. There is  still some purulent drainage and still some erythema surrounding this but I think in general things are better there is less pain. Integumentary (Hair, Skin) Wound #1 status is Open. Original cause of wound was Gradually Appeared. The wound is located on the Left Upper Arm. The wound measures 0.6cm length x 0.6cm width x 2.5cm depth; 0.283cm^2 area and 0.707cm^3 volume. There is Fat Layer (Subcutaneous Tissue) exposed. There is no tunneling noted, however, there is undermining starting at 12:00 and ending at 6:00 with a maximum distance of 1cm. There is a medium amount of purulent drainage noted. The wound margin is distinct with the outline attached to the wound base. There is large (67-100%) pink, pale granulation within the wound bed. There is a small (1-33%) amount of necrotic tissue within the wound bed including Adherent Slough. 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 - Thursday Nurse Visit: - Wednesday Bathing/ Shower/ Hygiene: May shower and wash wound with soap and water. Additional Orders / Instructions: Other: - Pick up additional antibiotics from your pharmacy today. ensure to practice range of motion of left arm and shoulder. The following medication(s) was prescribed: clindamycin HCl oral 150 mg capsule 3 capsules oral tid for a further 10 days for abscess left shoulder starting 01/30/2020 WOUND #1: - Upper Arm Wound Laterality: Left Cleanser: Soap and Water Every Other Day/7 Days Discharge Instructions: May shower and wash wound with dial antibacterial soap and water prior to dressing change. Prim Dressing: KerraCel Ag Gelling Fiber Dressing, 0.75x12 Ribbon (silver alginate) Every Other Day/7 Days ary Discharge  Instructions: Lightly pack into tunneling/undermining Secondary Dressing: ComfortFoam Border, 4x4 in (silicone border) Every Other Day/7 Days Discharge Instructions: Cover with foam border or gauze and tape 1. I have renewed her clindamycin at 450 mg 3 times daily for a further 10 days she is tolerating this well without side effects. I warned her to stop  this should she develop diarrhea greater than three liquid bowel movements in 48 hours 2. Still using silver alginate 3. I warned her to continue to do range of motion in her shoulder 4. Although the particular bacteria Alexandria is associated with bite injuries both animals and humans there was no such history available from the patient. 5. I am still a little concerned here still purulent drainage although I think things are a little better than last week I was hoping that they would be even better. Electronic Signature(s) Signed: 01/30/2020 3:50:51 PM By: Alexandria Ham MD Entered By: Alexandria Meyer on 01/30/2020 09:44:19 -------------------------------------------------------------------------------- SuperBill Details Patient Name: Date of Service: Alexandria Reap M. 01/30/2020 Medical Record Number: [id number] Patient Account Number: [id number] Date of Birth/Sex: Treating RN: April 18, 1970 (49 y.o. Alexandria Meyer, Meta.Reding Primary Care Provider: Carron Meyer, GUILFO RD Other Clinician: Referring Provider: Treating Provider/Extender: Alexandria Meyer RTMENT, GUILFO RD Weeks in Treatment: 0 Diagnosis Coding ICD-10 Codes Code Description L02.818 Cutaneous abscess of other sites L98.498 Non-pressure chronic ulcer of skin of other sites with other specified severity Z86.14 Personal history of Methicillin resistant Staphylococcus aureus infection M25.512 Pain in left shoulder Facility Procedures CPT4 Code: [id number] Description: 99213 - WOUND CARE VISIT-LEV 3 EST PT Modifier: Quantity: 1 Physician Procedures : CPT4 Code Description  Modifier [id number] - WC PHYS LEVEL 3 - EST PT ICD-10 Diagnosis Description L02.818 Cutaneous abscess of other sites L98.498 Non-pressure chronic ulcer of skin of other sites with other specified severity Quantity: 1 Electronic Signature(s) Signed: 01/30/2020 3:50:51 PM By: Alexandria Ham MD Signed: 01/30/2020 4:19:56 PM By: Deon Pilling Entered By: Deon Pilling on 01/30/2020 10:24:48

## 2020-02-04 ENCOUNTER — Encounter (HOSPITAL_BASED_OUTPATIENT_CLINIC_OR_DEPARTMENT_OTHER): Payer: Medicaid Other | Admitting: Internal Medicine

## 2020-02-13 ENCOUNTER — Encounter (HOSPITAL_BASED_OUTPATIENT_CLINIC_OR_DEPARTMENT_OTHER): Payer: Medicaid Other | Admitting: Internal Medicine

## 2020-02-13 ENCOUNTER — Other Ambulatory Visit: Payer: Self-pay

## 2020-02-13 ENCOUNTER — Encounter (HOSPITAL_BASED_OUTPATIENT_CLINIC_OR_DEPARTMENT_OTHER): Payer: Medicaid Other | Attending: Internal Medicine | Admitting: Internal Medicine

## 2020-02-13 DIAGNOSIS — Z8614 Personal history of Methicillin resistant Staphylococcus aureus infection: Secondary | ICD-10-CM | POA: Diagnosis not present

## 2020-02-13 DIAGNOSIS — L02414 Cutaneous abscess of left upper limb: Secondary | ICD-10-CM | POA: Diagnosis present

## 2020-02-13 DIAGNOSIS — M25512 Pain in left shoulder: Secondary | ICD-10-CM | POA: Diagnosis not present

## 2020-02-13 DIAGNOSIS — Z794 Long term (current) use of insulin: Secondary | ICD-10-CM | POA: Diagnosis not present

## 2020-02-13 DIAGNOSIS — E11622 Type 2 diabetes mellitus with other skin ulcer: Secondary | ICD-10-CM | POA: Diagnosis not present

## 2020-02-13 DIAGNOSIS — L98498 Non-pressure chronic ulcer of skin of other sites with other specified severity: Secondary | ICD-10-CM | POA: Diagnosis not present

## 2020-02-13 LAB — GLUCOSE, CAPILLARY
Glucose-Capillary: 21 mg/dL — CL (ref 70–99)
Glucose-Capillary: 40 mg/dL — CL (ref 70–99)
Glucose-Capillary: 51 mg/dL — ABNORMAL LOW (ref 70–99)

## 2020-02-13 NOTE — Progress Notes (Signed)
GENORA, CLAYCAMP ([id number]) Visit Report for 02/13/2020 HPI Details Patient Name: Date of Service: HA MIAMARIE, FIFIELD. 02/13/2020 9:30 A M Medical Record Number: [id number] Patient Account Number: [id number] Date of Birth/Sex: Treating RN: Jun 25, 1970 (50 y.o. Debby Bud Primary Care Provider: Carron Curie, GUILFO RD Other Clinician: Referring Provider: Treating Provider/Extender: Alvy Beal RTMENT, GUILFO RD Weeks in Treatment: 2 History of Present Illness HPI Description: ADMISSION 01/24/2020 This is a 50 year old woman with type 2 diabetes although in some parts of the epic notes she is listed as a type I diabetic. She is referred here I believe from her primary care provider for a draining wound on the left deltoid area. This apparently started sometime last month as an itchy area that she scratched. It is gradually expanded. She has been to urgent care on 11/26 and several times subsequently. The area was IandD but I do not see the cultures were done in the urgent care. She was initially put on Bactrim and doxycycline but she did not tolerate this. She subsequently went back to primary care at "[facility]". There is a relatively complete note from them dated 12/1. Apparently a culture was done at this time although I do not see that specific result. She was put on oral vancomycin 250 mg 3 times daily the doxycycline and sulfa antibiotic were stopped apparently the doxycycline causing nausea and the sulfa antibiotic would have been "ineffective" is what the patient remembers being told. 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 The patient describes a lot of pain rating 7-8 out of 10 she also has limitation of range of the shoulder although some of this may be chronic. Past medical history type 2 diabetes on insulin, hypertension, psoriasis, history of abscesses 12/23; patient is on clindamycin. Culture I did last  time showed anaerobic bacteria often associated with bite injuries but patient denies any such history. She is tolerating clindamycin well with no side effects in particular diarrhea. There is some improvement in the pain in measurements of the wound but still purulent drainage I was a bit disappointed about this. X-rays of the area were negative. The particular anaerobic bacteria cultured this time was Eikenella Corredens 1/6; not much change in this. Still a deep tunneling wound. There is less purulence but still drainage. There is erythema around the wound but less palpable tenderness. She is just starting the second prescription of clindamycin. I think there was a gap between the 2 prescriptions where she was not taking it Electronic Signature(s) Signed: 02/13/2020 5:18:36 PM By: Linton Ham MD Entered By: Linton Ham on 02/13/2020 11:01:18 -------------------------------------------------------------------------------- Physical Exam Details Patient Name: Date of Service: Alexandria Reap M. 02/13/2020 9:30 A M Medical Record Number: [id number] Patient Account Number: [id number] Date of Birth/Sex: Treating RN: 1970-04-25 (50 y.o. Debby Bud Primary Care Provider: Carron Curie, GUILFO RD Other Clinician: Referring Provider: Treating Provider/Extender: Alvy Beal RTMENT, GUILFO RD Weeks in Treatment: 2 Constitutional Sitting or standing Blood Pressure is within target range for patient.. Pulse regular and within target range for patient.Marland Kitchen Respirations regular, non-labored and within target range.. Temperature is normal and within the target range for the patient.Marland Kitchen Appears in no distress. Notes Wound exam; left deltoid. I think the direct depth in the undermining is about the same. There is certainly less purulence but some sanguinous drainage persists. There is still erythema around the wound which is a bit disappointing but no  palpable tenderness which is an improvement.  The tunnel still measures about the same going roughly at 2:00. Electronic Signature(s) Signed: 02/13/2020 5:18:36 PM By: Linton Ham MD Entered By: Linton Ham on 02/13/2020 11:00:20 -------------------------------------------------------------------------------- Physician Orders Details Patient Name: Date of Service: Alexandria Reap M. 02/13/2020 9:30 A M Medical Record Number: [id number] Patient Account Number: [id number] Date of Birth/Sex: Treating RN: [birth year] (50 y.o. Debby Bud Primary Care Provider: Carron Curie, GUILFO RD Other Clinician: Referring Provider: Treating Provider/Extender: Alvy Beal RTMENT, GUILFO RD Weeks in Treatment: 2 Verbal / Phone Orders: No Diagnosis Coding ICD-10 Coding Code Description L02.818 Cutaneous abscess of other sites L98.498 Non-pressure chronic ulcer of skin of other sites with other specified severity Z86.14 Personal history of Methicillin resistant Staphylococcus aureus infection M25.512 Pain in left shoulder Follow-up Appointments ppointment in 1 week. - Thursday Return A Bathing/ Shower/ Hygiene May shower and wash wound with soap and water. Additional Orders / Instructions Other: - ensure to practice range of motion of left arm and shoulder. Wound Treatment Wound #1 - Upper Arm Wound Laterality: Left Cleanser: Soap and Water Every Other Day/7 Days Discharge Instructions: May shower and wash wound with dial antibacterial soap and water prior to dressing change. Prim Dressing: Hydrofera Blue Classic Foam, 4x4 in Every Other Day/7 Days ary Discharge Instructions: Moisten with saline prior to applying to wound bed and undermining. Pack into undermining and depth. Secondary Dressing: ComfortFoam Border, 4x4 in (silicone border) Every Other Day/7 Days Discharge Instructions: Cover with foam border or gauze and tape Electronic Signature(s) Signed: 02/13/2020 5:18:36 PM By: Linton Ham MD Signed: 02/13/2020  6:28:52 PM By: Deon Pilling Entered By: Deon Pilling on 02/13/2020 10:29:12 -------------------------------------------------------------------------------- Problem List Details Patient Name: Date of Service: HA Mickel Crow M. 02/13/2020 9:30 A M Medical Record Number: [id number] Patient Account Number: [id number] Date of Birth/Sex: Treating RN: 05-09-1970 (50 y.o. Helene Shoe, Meta.Reding Primary Care Provider: Carron Curie, GUILFO RD Other Clinician: Referring Provider: Treating Provider/Extender: Alvy Beal RTMENT, GUILFO RD Weeks in Treatment: 2 Active Problems ICD-10 Encounter Code Description Active Date MDM Diagnosis L02.818 Cutaneous abscess of other sites 01/24/2020 No Yes L98.498 Non-pressure chronic ulcer of skin of other sites with other specified severity 01/24/2020 No Yes Z86.14 Personal history of Methicillin resistant Staphylococcus aureus infection 01/24/2020 No Yes M25.512 Pain in left shoulder 01/24/2020 No Yes Inactive Problems Resolved Problems Electronic Signature(s) Signed: 02/13/2020 5:18:36 PM By: Linton Ham MD Entered By: Linton Ham on 02/13/2020 10:56:15 -------------------------------------------------------------------------------- Progress Note Details Patient Name: Date of Service: Alexandria Reap M. 02/13/2020 9:30 A M Medical Record Number: [id number] Patient Account Number: [id number] Date of Birth/Sex: Treating RN: [birth year] (50 y.o. Helene Shoe, Meta.Reding Primary Care Provider: Carron Curie, GUILFO RD Other Clinician: Referring Provider: Treating Provider/Extender: Alvy Beal RTMENT, GUILFO RD Weeks in Treatment: 2 Subjective History of Present Illness (HPI) ADMISSION 01/24/2020 This is a 50 year old woman with type 2 diabetes although in some parts of the epic notes she is listed as a type I diabetic. She is referred here I believe from her primary care provider for a draining wound on the left deltoid area. This  apparently started sometime last month as an itchy area that she scratched. It is gradually expanded. She has been to urgent care on 11/26 and several times subsequently. The area was IandD but I do not see the cultures were done in the urgent care. She was initially put on Bactrim and doxycycline but she did not tolerate this. She subsequently  went back to primary care at [facility]". There is a relatively complete note from them dated 12/1. Apparently a culture was done at this time although I do not see that specific result. She was put on oral vancomycin 250 mg 3 times daily the doxycycline and sulfa antibiotic were stopped apparently the doxycycline causing nausea and the sulfa antibiotic would have been "ineffective" is what the patient remembers being told. 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 The patient describes a lot of pain rating 7-8 out of 10 she also has limitation of range of the shoulder although some of this may be chronic. Past medical history type 2 diabetes on insulin, hypertension, psoriasis, history of abscesses 12/23; patient is on clindamycin. Culture I did last time showed anaerobic bacteria often associated with bite injuries but patient denies any such history. She is tolerating clindamycin well with no side effects in particular diarrhea. There is some improvement in the pain in measurements of the wound but still purulent drainage I was a bit disappointed about this. X-rays of the area were negative. The particular anaerobic bacteria cultured this time was Eikenella Corredens 1/6; not much change in this. Still a deep tunneling wound. There is less purulence but still drainage. There is erythema around the wound but less palpable tenderness. She is just starting the second prescription of clindamycin. I think there was a gap between the 2 prescriptions where she was not taking  it Objective Constitutional Sitting or standing Blood Pressure is within target range for patient.. Pulse regular and within target range for patient.Marland Kitchen Respirations regular, non-labored and within target range.. Temperature is normal and within the target range for the patient.Marland Kitchen Appears in no distress. Vitals Time Taken: 9:41 AM, Height: 62 in, Weight: 215 lbs, BMI: 39.3, Temperature: 98.3 F, Pulse: 66 bpm, Respiratory Rate: 18 breaths/min, Blood Pressure: 135/75 mmHg. General Notes: Wound exam; left deltoid. I think the direct depth in the undermining is about the same. There is certainly less purulence but some sanguinous drainage persists. There is still erythema around the wound which is a bit disappointing but no palpable tenderness which is an improvement. The tunnel still measures about the same going roughly at 2:00. Integumentary (Hair, Skin) Wound #1 status is Open. Original cause of wound was Gradually Appeared. The wound is located on the Left Upper Arm. The wound measures 0.6cm length x 0.6cm width x 2.3cm depth; 0.283cm^2 area and 0.65cm^3 volume. There is Fat Layer (Subcutaneous Tissue) exposed. There is no tunneling noted, however, there is undermining starting at 12:00 and ending at 3:00 with a maximum distance of 2.3cm. There is a medium amount of serosanguineous drainage noted. The wound margin is distinct with the outline attached to the wound base. There is large (67-100%) red granulation within the wound bed. There is no necrotic tissue within the wound bed. 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 - Thursday Bathing/ Shower/ Hygiene: May shower and wash wound with soap and water. Additional Orders /  Instructions: Other: - ensure to practice range of motion of left arm and shoulder. WOUND #1: - Upper Arm Wound Laterality: Left Cleanser: Soap and Water Every Other Day/7 Days Discharge Instructions: May shower and wash wound with dial antibacterial soap  and water prior to dressing change. Prim Dressing: Hydrofera Blue Classic Foam, 4x4 in Every Other Day/7 Days ary Discharge Instructions: Moisten with saline prior to applying to wound bed and undermining. Pack into undermining and depth. Secondary Dressing: ComfortFoam Border, 4x4 in (silicone border) Every Other Day/7 Days Discharge Instructions: Cover with foam border or gauze and tape 1. We change the dressing to Hydrofera Blue packing rope. She does not have insuran but we should be able to give her enough to be able to change the dressing once or twice 2. She is just starting the second prescription of clindamycin. I do not really understand the gap and in talking to her I did not really get a sense of what happened. She is however tolerating this without side effect in particular no diarrhea 3. If we cannot get this area to close in there are not a lot of options here. I thought about a snap VAC although without insurance I do not think we could manage this. I also thought about referring her to surgery this may need to be open further to give this a chance to close if they cannot do this at the time of her procedure Electronic Signature(s) Signed: 02/13/2020 5:18:36 PM By: Linton Ham MD Entered By: Linton Ham on 02/13/2020 11:02:47 -------------------------------------------------------------------------------- SuperBill Details Patient Name: Date of Service: Alexandria Reap M. 02/13/2020 Medical Record Number: [id number] Patient Account Number: [id number] Date of Birth/Sex: Treating RN: 05-11-1970 (50 y.o. Helene Shoe, Meta.Reding Primary Care Provider: Carron Curie, GUILFO RD Other Clinician: Referring Provider: Treating  Provider/Extender: Alvy Beal RTMENT, GUILFO RD Weeks in Treatment: 2 Diagnosis Coding ICD-10 Codes Code Description L02.818 Cutaneous abscess of other sites L98.498 Non-pressure chronic ulcer of skin of other sites with other specified severity Z86.14 Personal history of Methicillin resistant Staphylococcus aureus infection M25.512 Pain in left shoulder Facility Procedures CPT4 Code: [id number] Description: 99213 - WOUND CARE VISIT-LEV 3 EST PT Modifier: Quantity: 1 Physician Procedures : CPT4 Code Description Modifier [id number] - WC PHYS LEVEL 3 - EST PT ICD-10 Diagnosis Description L02.818 Cutaneous abscess of other sites L98.498 Non-pressure chronic ulcer of skin of other sites with other specified severity Z86.14 Personal  history of Methicillin resistant Staphylococcus aureus infection Quantity: 1 Electronic Signature(s) Signed: 02/13/2020 5:18:36 PM By: Linton Ham MD Entered By: Linton Ham on 02/13/2020 11:03:04

## 2020-02-17 ENCOUNTER — Encounter (HOSPITAL_BASED_OUTPATIENT_CLINIC_OR_DEPARTMENT_OTHER): Payer: Medicaid Other | Admitting: Internal Medicine

## 2020-02-17 NOTE — Progress Notes (Signed)
MARGUERITE, BARBA ([id number]) Visit Report for 02/13/2020 Arrival Information Details Patient Name: Date of Service: HA LAVAUN, [place]. 02/13/2020 9:30 A M Medical Record Number: [id number] Patient Account Number: [id number] Date of Birth/Sex: Treating RN: May 11, 1970 (50 y.o. Helene Shoe, Meta.Reding Primary Care Larosa Rhines: Carron Curie, GUILFO RD Other Clinician: Referring Blessings Inglett: Treating Karla Vines/Extender: Alvy Beal RTMENT, GUILFO RD Weeks in Treatment: 2 Visit Information History Since Last Visit Added or deleted any medications: No Patient Arrived: Ambulatory Any new allergies or adverse reactions: No Arrival Time: 09:40 Had a fall or experienced change in No Accompanied By: self activities of daily living that may affect Transfer Assistance: None risk of falls: Patient Identification Verified: Yes Signs or symptoms of abuse/neglect since last visito No Secondary Verification Process Completed: Yes Hospitalized since last visit: No Patient Requires Transmission-Based Precautions: No Implantable device outside of the clinic excluding No Patient Has Alerts: No cellular tissue based products placed in the center since last visit: Has Dressing in Place as Prescribed: Yes Pain Present Now: Yes Electronic Signature(s) Signed: 02/14/2020 11:05:27 AM By: Sandre Kitty Entered By: Sandre Kitty on 02/13/2020 09:41:18 -------------------------------------------------------------------------------- Clinic Level of Care Assessment Details Patient Name: Date of Service: HA WHITLEIGH, GARRAMONE. 02/13/2020 9:30 A M Medical Record Number: [id number] Patient Account Number: [id number] Date of Birth/Sex: Treating RN: March 22, 1970 (50 y.o. Helene Shoe, Meta.Reding Primary Care Jakyria Bleau: Carron Curie, GUILFO RD Other Clinician: Referring Ivori Storr: Treating Montel Vanderhoof/Extender: Alvy Beal RTMENT, GUILFO RD Weeks in Treatment: 2 Clinic Level of Care Assessment Items TOOL 4 Quantity  Score X- 1 0 Use when only an EandM is performed on FOLLOW-UP visit ASSESSMENTS - Nursing Assessment / Reassessment X- 1 10 Reassessment of Co-morbidities (includes updates in patient status) X- 1 5 Reassessment of Adherence to Treatment Plan ASSESSMENTS - Wound and Skin A ssessment / Reassessment X - Simple Wound Assessment / Reassessment - one wound 1 5 []  - 0 Complex Wound Assessment / Reassessment - multiple wounds X- 1 10 Dermatologic / Skin Assessment (not related to wound area) ASSESSMENTS - Focused Assessment []  - 0 Circumferential Edema Measurements - multi extremities X- 1 10 Nutritional Assessment / Counseling / Intervention []  - 0 Lower Extremity Assessment (monofilament, tuning fork, pulses) []  - 0 Peripheral Arterial Disease Assessment (using hand held doppler) ASSESSMENTS - Ostomy and/or Continence Assessment and Care []  - 0 Incontinence Assessment and Management []  - 0 Ostomy Care Assessment and Management (repouching, etc.) PROCESS - Coordination of Care X - Simple Patient / Family Education for ongoing care 1 15 []  - 0 Complex (extensive) Patient / Family Education for ongoing care X- 1 10 Staff obtains Programmer, systems, Records, T Results / Process Orders est []  - 0 Staff telephones HHA, Nursing Homes / Clarify orders / etc []  - 0 Routine Transfer to another Facility (non-emergent condition) []  - 0 Routine Hospital Admission (non-emergent condition) []  - 0 New Admissions / Biomedical engineer / Ordering NPWT Apligraf, etc. , []  - 0 Emergency Hospital Admission (emergent condition) X- 1 10 Simple Discharge Coordination []  - 0 Complex (extensive) Discharge Coordination PROCESS - Special Needs []  - 0 Pediatric / Minor Patient Management []  - 0 Isolation Patient Management []  - 0 Hearing / Language / Visual special needs []  - 0 Assessment of Community assistance (transportation, D/C planning, etc.) []  - 0 Additional assistance / Altered  mentation []  - 0 Support Surface(s) Assessment (bed, cushion, seat, etc.) INTERVENTIONS - Wound Cleansing / Measurement X - Simple Wound Cleansing - one wound 1 5 []  -  0 Complex Wound Cleansing - multiple wounds X- 1 5 Wound Imaging (photographs - any number of wounds) []  - 0 Wound Tracing (instead of photographs) X- 1 5 Simple Wound Measurement - one wound []  - 0 Complex Wound Measurement - multiple wounds INTERVENTIONS - Wound Dressings X - Small Wound Dressing one or multiple wounds 1 10 []  - 0 Medium Wound Dressing one or multiple wounds []  - 0 Large Wound Dressing one or multiple wounds []  - 0 Application of Medications - topical []  - 0 Application of Medications - injection INTERVENTIONS - Miscellaneous []  - 0 External ear exam []  - 0 Specimen Collection (cultures, biopsies, blood, body fluids, etc.) []  - 0 Specimen(s) / Culture(s) sent or taken to Lab for analysis []  - 0 Patient Transfer (multiple staff / / Similar devices) []  - 0 Simple Staple / Suture removal (25 or less) []  - 0 Complex Staple / Suture removal (26 or more) []  - 0 Hypo / Hyperglycemic Management (close monitor of Blood Glucose) []  - 0 Ankle / Brachial Index (ABI) - do not check if billed separately X- 1 5 Vital Signs Has the patient been seen at the hospital within the last three years: Yes Total Score: 105 Level Of Care: New/Established - Level 3 Electronic Signature(s) Signed: 02/13/2020 6:28:52 PM By: Entered By: on 02/13/2020 10:29:41 -------------------------------------------------------------------------------- Encounter Discharge Information Details Patient Name: Date of Service: HA M. 02/13/2020 9:30 A M Medical Record Number: Patient Account Number: Date of Birth/Sex: Treating RN: May 02, 1970 (49 y.o. Nurse, adult Primary Care Jahmani Staup: , GUILFO RD Other Clinician: Referring  Eara Burruel: Treating Jamilynn Whitacre/Extender: RTMENT, GUILFO RD Weeks in Treatment: 2 Encounter Discharge Information Items Discharge Condition: Stable Ambulatory Status: Ambulatory Discharge Destination: Home Transportation: Private Auto Accompanied By: alone Schedule Follow-up Appointment: Yes Clinical Summary of Care: Patient Declined Notes Patient appeared very lethargic while dressing being applied, patient hands clammy, I asked patient if she had eaten today, she states she had not, but she stated that she did take 60 units of insulin before her appt today. I checked patient blood sugar, initial reading 21 at 1055, pt was immediately given 8 oz of cranberry juice, 8 oz of Glucerna, and 1 glucose tablet. Blood sugar rechecked at 1109 with reading of 40, pt given another 8 oz of Cranberry juice and peanut butter crackers, she became more alert, but still slow to answer questions. Blood sugar rechecked at 1120 with reading of 51, pt much more alert, oriented to person and place, stated she needed to get home. Encouraged patient to stay longer in order stabilize her blood sugar, pt stated that she "would be fine." Electronic Signature(s) Signed: 02/17/2020 5:04:19 PM By: RN, BSN Entered By: 04/12/2020 on 02/13/2020 17:14:03 -------------------------------------------------------------------------------- Lower Extremity Assessment Details Patient Name: Date of Service: HA DELBERT, VU M. 02/13/2020 9:30 A M Medical Record Number: Laural Roes Patient Account Number: 04/12/2020 Date of Birth/Sex: Treating RN: November 16, 1970 (49 y.o. [id number] Primary Care Jasaun Carn: 07/14/1970, GUILFO RD Other Clinician: Referring Ebonique Hallstrom: Treating Arlin Sass/Extender: Wynelle Link RTMENT, GUILFO RD Weeks in Treatment: 2 Electronic Signature(s) Signed: 02/13/2020 6:28:52 PM By: Claudie Revering Entered By: 04/16/2020 on 02/13/2020  10:25:43 -------------------------------------------------------------------------------- Multi Wound Chart Details Patient Name: Date of Service: Zandra Abts M. 02/13/2020 9:30 A M Medical Record Number: Laural Roes Patient Account Number: 04/12/2020 Date of Birth/Sex: Treating RN: 1970-08-03 (49 y.o. [id number] Primary Care Krishika Bugge:  DEPA RTMENT, GUILFO RD Other Clinician: Referring Kristyne Woodring: Treating Keasha Malkiewicz/Extender: Alvy Beal RTMENT, GUILFO RD Weeks in Treatment: 2 Vital Signs Height(in): 62 Pulse(bpm): 66 Weight(lbs): 215 Blood Pressure(mmHg): 135/75 Body Mass Index(BMI): 39 Temperature(F): 98.3 Respiratory Rate(breaths/min): 18 Photos: [1:No Photos Left Upper Arm] [N/A:N/A N/A] Wound Location: [1:Gradually Appeared] [N/A:N/A] Wounding Event: [1:Abscess] [N/A:N/A] Primary Etiology: [1:Hypertension, Type II Diabetes,] [N/A:N/A] Comorbid History: [1:Scleroderma 01/03/2020] [N/A:N/A] Date Acquired: [1:2] [N/A:N/A] Weeks of Treatment: [1:Open] [N/A:N/A] Wound Status: [1:0.6x0.6x2.3] [N/A:N/A] Measurements L x W x D (cm) [1:0.283] [N/A:N/A] A (cm) : rea [1:0.65] [N/A:N/A] Volume (cm) : [1:-44.40%] [N/A:N/A] % Reduction in A rea: [1:-65.40%] [N/A:N/A] % Reduction in Volume: [1:12] Starting Position 1 (o'clock): [1:3] Ending Position 1 (o'clock): [1:2.3] Maximum Distance 1 (cm): [1:Yes] [N/A:N/A] Undermining: [1:Full Thickness Without Exposed] [N/A:N/A] Classification: [1:Support Structures Medium] [N/A:N/A] Exudate Amount: [1:Serosanguineous] [N/A:N/A] Exudate Type: [1:red, brown] [N/A:N/A] Exudate Color: [1:Distinct, outline attached] [N/A:N/A] Wound Margin: [1:Large (67-100%)] [N/A:N/A] Granulation Amount: [1:Red] [N/A:N/A] Granulation Quality: [1:None Present (0%)] [N/A:N/A] Necrotic Amount: [1:Fat Layer (Subcutaneous Tissue): Yes N/A] Exposed Structures: [1:Fascia: No Tendon: No Muscle: No Joint: No Bone: No None] [N/A:N/A] Treatment  Notes Electronic Signature(s) Signed: 02/13/2020 5:18:36 PM By: Linton Ham MD Signed: 02/13/2020 6:28:52 PM By: Deon Pilling Entered By: Linton Ham on 02/13/2020 10:56:22 -------------------------------------------------------------------------------- Multi-Disciplinary Care Plan Details Patient Name: Date of Service: [name] 02/13/2020 9:30 A M Medical Record Number: [id number] Patient Account Number: [id number] Date of Birth/Sex: Treating RN: 03/08/1970 (50 y.o. Helene Shoe, Meta.Reding Primary Care Donta Fuster: Carron Curie, GUILFO RD Other Clinician: Referring Ashante Yellin: Treating Nema Oatley/Extender: Alvy Beal RTMENT, GUILFO RD Weeks in Treatment: 2 Active Inactive Nutrition Nursing Diagnoses: Impaired glucose control: actual or potential Potential for alteratiion in Nutrition/Potential for imbalanced nutrition Goals: Patient/caregiver agrees to and verbalizes understanding of need to use nutritional supplements and/or vitamins as prescribed Date Initiated: 01/24/2020 Target Resolution Date: 04/10/2020 Goal Status: Active Patient/caregiver will maintain therapeutic glucose control Date Initiated: 01/24/2020 Target Resolution Date: 04/09/2020 Goal Status: Active Interventions: Assess HgA1c results as ordered upon admission and as needed Assess patient nutrition upon admission and as needed per policy Provide education on elevated blood sugars and impact on wound healing Provide education on nutrition Treatment Activities: Education provided on Nutrition : 01/24/2020 Notes: Wound/Skin Impairment Nursing Diagnoses: Impaired tissue integrity Knowledge deficit related to ulceration/compromised skin integrity Goals: Patient/caregiver will verbalize understanding of skin care regimen Date Initiated: 01/24/2020 Target Resolution Date: 04/09/2020 Goal Status: Active Ulcer/skin breakdown will have a volume reduction of 30% by week 4 Date Initiated:  01/24/2020 Target Resolution Date: 02/21/2020 Goal Status: Active Interventions: Assess patient/caregiver ability to obtain necessary supplies Assess patient/caregiver ability to perform ulcer/skin care regimen upon admission and as needed Assess ulceration(s) every visit Provide education on ulcer and skin care Notes: Electronic Signature(s) Signed: 02/13/2020 6:28:52 PM By: Deon Pilling Entered By: Deon Pilling on 02/13/2020 09:40:13 -------------------------------------------------------------------------------- Pain Assessment Details Patient Name: Date of Service: ATZIRY, BARANSKI 02/13/2020 9:30 A M Medical Record Number: [id number] Patient Account Number: [id number] Date of Birth/Sex: Treating RN: [birth year] (50 y.o. Debby Bud Primary Care Asia Favata: Carron Curie, GUILFO RD Other Clinician: Referring Nyla Creason: Treating Laramie Gelles/Extender: Alvy Beal RTMENT, GUILFO RD Weeks in Treatment: 2 Active Problems Location of Pain Severity and Description of Pain Patient Has Paino Yes Site Locations Pain Location: Pain in Ulcers With Dressing Change: Yes Duration of the Pain. Constant / Intermittento Constant Rate the pain. Current Pain Level: 6 Worst Pain Level: 8 Least Pain Level: 5 Character  of Pain Describe the Pain: Aching, Throbbing Pain Management and Medication Current Pain Management: Medication: Yes Other: reposition Is the Current Pain Management Adequate: Adequate How does your wound impact your activities of daily livingo Sleep: Yes Bathing: No Appetite: No Relationship With Others: No Bladder Continence: No Emotions: No Bowel Continence: No Work: No Toileting: No Drive: No Dressing: No Hobbies: No Electronic Signature(s) Signed: 02/13/2020 6:02:34 PM By: Baruch Gouty RN, BSN Signed: 02/13/2020 6:28:52 PM By: Deon Pilling Entered By: Baruch Gouty on 02/13/2020  09:56:24 -------------------------------------------------------------------------------- Patient/Caregiver Education Details Patient Name: Date of Service: HA Anders Simmonds 1/6/2022andnbsp9:30 A M Medical Record Number: [id number] Patient Account Number: [id number] Date of Birth/Gender: Treating RN: 10-26-1970 (50 y.o. Debby Bud Primary Care Physician: Carron Curie, GUILFO RD Other Clinician: Referring Physician: Treating Physician/Extender: Alvy Beal RTMENT, GUILFO RD Weeks in Treatment: 2 Education Assessment Education Provided To: Patient Education Topics Provided Elevated Blood Sugar/ Impact on Healing: Handouts: Elevated Blood Sugars: How Do They Affect Wound Healing Methods: Explain/Verbal Responses: Reinforcements needed Electronic Signature(s) Signed: 02/13/2020 6:28:52 PM By: Deon Pilling Entered By: Deon Pilling on 02/13/2020 09:40:26 -------------------------------------------------------------------------------- Wound Assessment Details Patient Name: Date of Service: HA SUSANNE, BAUMGARNER. 02/13/2020 9:30 A M Medical Record Number: [id number] Patient Account Number: [id number] Date of Birth/Sex: Treating RN: Jun 26, 1970 (50 y.o. Helene Shoe, Meta.Reding Primary Care Kiowa Hollar: Carron Curie, GUILFO RD Other Clinician: Referring Jozette Castrellon: Treating Cassara Nida/Extender: Alvy Beal RTMENT, GUILFO RD Weeks in Treatment: 2 Wound Status Wound Number: 1 Primary Etiology: Abscess Wound Location: Left Upper Arm Wound Status: Open Wounding Event: Gradually Appeared Comorbid History: Hypertension, Type II Diabetes, Scleroderma Date Acquired: 01/03/2020 Weeks Of Treatment: 2 Clustered Wound: No Wound Measurements Length: (cm) 0.6 Width: (cm) 0.6 Depth: (cm) 2.3 Area: (cm) 0.283 Volume: (cm) 0.65 % Reduction in Area: -44.4% % Reduction in Volume: -65.4% Epithelialization: None Tunneling: No Undermining: Yes Starting Position (o'clock):  12 Ending Position (o'clock): 3 Maximum Distance: (cm) 2.3 Wound Description Classification: Full Thickness Without Exposed Support Structures Wound Margin: Distinct, outline attached Exudate Amount: Medium Exudate Type: Serosanguineous Exudate Color: red, brown Foul Odor After Cleansing: No Slough/Fibrino Yes Wound Bed Granulation Amount: Large (67-100%) Exposed Structure Granulation Quality: Red Fascia Exposed: No Necrotic Amount: None Present (0%) Fat Layer (Subcutaneous Tissue) Exposed: Yes Tendon Exposed: No Muscle Exposed: No Joint Exposed: No Bone Exposed: No Treatment Notes Wound #1 (Upper Arm) Wound Laterality: Left Cleanser Soap and Water Discharge Instruction: May shower and wash wound with dial antibacterial soap and water prior to dressing change. Peri-Wound Care Topical Primary Dressing Hydrofera Blue Classic Foam, 4x4 in Discharge Instruction: Moisten with saline prior to applying to wound bed and undermining. Pack into undermining and depth. Secondary Dressing ComfortFoam Border, 4x4 in (silicone border) Discharge Instruction: Cover with foam border or gauze and tape Secured With Compression Wrap Compression Stockings Add-Ons Electronic Signature(s) Signed: 02/13/2020 6:28:52 PM By: Deon Pilling Entered By: Deon Pilling on 02/13/2020 10:26:53 -------------------------------------------------------------------------------- Vitals Details Patient Name: Date of Service: HA Mickel Crow M. 02/13/2020 9:30 A M Medical Record Number: [id number] Patient Account Number: [id number] Date of Birth/Sex: Treating RN: 08/20/1970 (50 y.o. Helene Shoe, Meta.Reding Primary Care Yohana Bartha: Carron Curie, GUILFO RD Other Clinician: Referring Oluwatobi Visser: Treating Jonie Burdell/Extender: Alvy Beal RTMENT, GUILFO RD Weeks in Treatment: 2 Vital Signs Time Taken: 09:41 Temperature (F): 98.3 Height (in): 62 Pulse (bpm): 66 Weight (lbs): 215 Respiratory Rate (breaths/min):  18 Body Mass Index (BMI): 39.3 Blood Pressure (mmHg): 135/75 Reference Range: 80 - 120 mg / dl  Electronic Signature(s) Signed: 02/14/2020 11:05:27 AM By: Karl Ito Entered By: Karl Ito on 02/13/2020 09:41:36

## 2020-02-20 ENCOUNTER — Other Ambulatory Visit: Payer: Self-pay

## 2020-02-20 ENCOUNTER — Encounter (HOSPITAL_BASED_OUTPATIENT_CLINIC_OR_DEPARTMENT_OTHER): Payer: Medicaid Other | Admitting: Internal Medicine

## 2020-02-20 DIAGNOSIS — L02414 Cutaneous abscess of left upper limb: Secondary | ICD-10-CM | POA: Diagnosis not present

## 2020-02-20 NOTE — Progress Notes (Signed)
Alexandria Meyer, Alexandria Meyer ([id number]) Visit Report for 02/20/2020 Arrival Information Details Patient Name: Date of Service: Alexandria Meyer, Alexandria Meyer. 02/20/2020 9:30 A M Medical Record Number: [id number] Patient Account Number: [id number] Date of Birth/Sex: Treating RN: Nov 10, 1970 (50 y.o. Nancy Fetter Primary Care Harrell Niehoff: Carron Curie, GUILFO RD Other Clinician: Referring Benecio Kluger: Treating Leaann Nevils/Extender: Alvy Beal RTMENT, GUILFO RD Weeks in Treatment: 3 Visit Information History Since Last Visit Added or deleted any medications: No Patient Arrived: Cane Any new allergies or adverse reactions: No Arrival Time: 09:15 Had a fall or experienced change in No Accompanied By: alone activities of daily living that may affect Transfer Assistance: None risk of falls: Patient Identification Verified: Yes Signs or symptoms of abuse/neglect since last visito No Secondary Verification Process Completed: Yes Hospitalized since last visit: No Patient Requires Transmission-Based Precautions: No Implantable device outside of the clinic excluding No Patient Has Alerts: No cellular tissue based products placed in the center since last visit: Has Dressing in Place as Prescribed: Yes Pain Present Now: Yes Electronic Signature(s) Signed: 02/20/2020 5:28:50 PM By: Levan Hurst RN, BSN Entered By: Levan Hurst on 02/20/2020 09:15:21 -------------------------------------------------------------------------------- Clinic Level of Care Assessment Details Patient Name: Date of Service: Alexandria Meyer, Alexandria Meyer. 02/20/2020 9:30 A M Medical Record Number: [id number] Patient Account Number: [id number] Date of Birth/Sex: Treating RN: 01-Aug-1970 (50 y.o. Helene Shoe, Meta.Reding Primary Care Romana Deaton: Carron Curie, GUILFO RD Other Clinician: Referring Elam Ellis: Treating Karlynn Furrow/Extender: Alvy Beal RTMENT, GUILFO RD Weeks in Treatment: 3 Clinic Level of Care Assessment Items TOOL 4 Quantity  Score X- 1 0 Use when only an EandM is performed on FOLLOW-UP visit ASSESSMENTS - Nursing Assessment / Reassessment X- 1 10 Reassessment of Co-morbidities (includes updates in patient status) X- 1 5 Reassessment of Adherence to Treatment Plan ASSESSMENTS - Wound and Skin A ssessment / Reassessment X - Simple Wound Assessment / Reassessment - one wound 1 5 []  - 0 Complex Wound Assessment / Reassessment - multiple wounds X- 1 10 Dermatologic / Skin Assessment (not related to wound area) ASSESSMENTS - Focused Assessment []  - 0 Circumferential Edema Measurements - multi extremities X- 1 10 Nutritional Assessment / Counseling / Intervention []  - 0 Lower Extremity Assessment (monofilament, tuning fork, pulses) []  - 0 Peripheral Arterial Disease Assessment (using hand held doppler) ASSESSMENTS - Ostomy and/or Continence Assessment and Care []  - 0 Incontinence Assessment and Management []  - 0 Ostomy Care Assessment and Management (repouching, etc.) PROCESS - Coordination of Care X - Simple Patient / Family Education for ongoing care 1 15 []  - 0 Complex (extensive) Patient / Family Education for ongoing care X- 1 10 Staff obtains Programmer, systems, Records, T Results / Process Orders est []  - 0 Staff telephones HHA, Nursing Homes / Clarify orders / etc []  - 0 Routine Transfer to another Facility (non-emergent condition) []  - 0 Routine Hospital Admission (non-emergent condition) []  - 0 New Admissions / Biomedical engineer / Ordering NPWT Apligraf, etc. , []  - 0 Emergency Hospital Admission (emergent condition) X- 1 10 Simple Discharge Coordination []  - 0 Complex (extensive) Discharge Coordination PROCESS - Special Needs []  - 0 Pediatric / Minor Patient Management []  - 0 Isolation Patient Management []  - 0 Hearing / Language / Visual special needs []  - 0 Assessment of Community assistance (transportation, D/C planning, etc.) []  - 0 Additional assistance / Altered  mentation []  - 0 Support Surface(s) Assessment (bed, cushion, seat, etc.) INTERVENTIONS - Wound Cleansing / Measurement X - Simple Wound Cleansing - one wound 1  5 []  - 0 Complex Wound Cleansing - multiple wounds X- 1 5 Wound Imaging (photographs - any number of wounds) []  - 0 Wound Tracing (instead of photographs) X- 1 5 Simple Wound Measurement - one wound []  - 0 Complex Wound Measurement - multiple wounds INTERVENTIONS - Wound Dressings X - Small Wound Dressing one or multiple wounds 1 10 []  - 0 Medium Wound Dressing one or multiple wounds []  - 0 Large Wound Dressing one or multiple wounds []  - 0 Application of Medications - topical []  - 0 Application of Medications - injection INTERVENTIONS - Miscellaneous []  - 0 External ear exam []  - 0 Specimen Collection (cultures, biopsies, blood, body fluids, etc.) []  - 0 Specimen(s) / Culture(s) sent or taken to Lab for analysis []  - 0 Patient Transfer (multiple staff / / Similar devices) []  - 0 Simple Staple / Suture removal (25 or less) []  - 0 Complex Staple / Suture removal (26 or more) []  - 0 Hypo / Hyperglycemic Management (close monitor of Blood Glucose) []  - 0 Ankle / Brachial Index (ABI) - do not check if billed separately X- 1 5 Vital Signs Has the patient been seen at the hospital within the last three years: Yes Total Score: 105 Level Of Care: New/Established - Level 3 Electronic Signature(s) Signed: 02/20/2020 5:39:22 PM By: Entered By: on 02/20/2020 09:48:52 -------------------------------------------------------------------------------- Encounter Discharge Information Details Patient Name: Date of Service: Alexandria M. 02/20/2020 9:30 A M Medical Record Number: Patient Account Number: Date of Birth/Sex: Treating RN: 1970-08-06 (50 y.o. Primary Care Kashus Karlen: Nurse, adult, GUILFO RD Other Clinician: Referring  Marcianne Ozbun: Treating Maycie Luera/Extender: RTMENT, GUILFO RD Weeks in Treatment: 3 Encounter Discharge Information Items Discharge Condition: Stable Ambulatory Status: Cane Discharge Destination: Home Transportation: Private Auto Accompanied By: self Schedule Follow-up Appointment: Yes Clinical Summary of Care: Patient Declined Electronic Signature(s) Signed: 02/20/2020 5:40:31 PM By: RN, BSN Entered By: on 02/20/2020 10:01:48 -------------------------------------------------------------------------------- Multi Wound Chart Details Patient Name: Date of Service: Alexandria Stall M. 02/20/2020 9:30 A M Medical Record Number: 02/22/2020 Patient Account Number: Laural Roes Date of Birth/Sex: Treating RN: Feb 12, 1970 (49 y.o. [id number], [id number] Primary Care Terrilyn Tyner: 07/14/1970, GUILFO RD Other Clinician: Referring Tyja Gortney: Treating Bennetta Rudden/Extender: Tommye Standard RTMENT, GUILFO RD Weeks in Treatment: 3 Vital Signs Height(in): 62 Pulse(bpm): 77 Weight(lbs): 215 Blood Pressure(mmHg): 161/91 Body Mass Index(BMI): 39 Temperature(F): 98.2 Respiratory Rate(breaths/min): 18 Photos: [1:No Photos Left Upper Arm] [N/A:N/A N/A] Wound Location: [1:Gradually Appeared] [N/A:N/A] Wounding Event: [1:Abscess] [N/A:N/A] Primary Etiology: [1:Hypertension, Type II Diabetes,] [N/A:N/A] Comorbid History: [1:Scleroderma 01/03/2020] [N/A:N/A] Date Acquired: [1:3] [N/A:N/A] Weeks of Treatment: [1:Open] [N/A:N/A] Wound Status: [1:0.7x0.7x2.9] [N/A:N/A] Measurements L x W x D (cm) [1:0.385] [N/A:N/A] A (cm) : rea [1:1.116] [N/A:N/A] Volume (cm) : [1:-96.40%] [N/A:N/A] % Reduction in A rea: [1:-184.00%] [N/A:N/A] % Reduction in Volume: [1:9] Starting Position 1 (o'clock): [1:12] Ending Position 1 (o'clock): [1:2.1] Maximum Distance 1 (cm): [1:Yes] [N/A:N/A] Undermining: [1:Full Thickness Without Exposed] [N/A:N/A] Classification: [1:Support  Structures Medium] [N/A:N/A] Exudate Amount: [1:Serosanguineous] [N/A:N/A] Exudate Type: [1:red, brown] [N/A:N/A] Exudate Color: [1:Distinct, outline attached] [N/A:N/A] Wound Margin: [1:Large (67-100%)] [N/A:N/A] Granulation Amount: [1:Red] [N/A:N/A] Granulation Quality: [1:None Present (0%)] [N/A:N/A] Necrotic Amount: [1:Fat Layer (Subcutaneous Tissue): Yes N/A] Exposed Structures: [1:Fascia: No Tendon: No Muscle: No Joint: No Bone: No None] [N/A:N/A] Treatment Notes Wound #1 (Upper Arm) Wound Laterality: Left Cleanser Soap and Water Discharge Instruction: May shower and wash wound  with dial antibacterial soap and water prior to dressing change. Peri-Wound Care Topical Primary Dressing Hydrofera Blue Classic Foam, 4x4 in Discharge Instruction: Moisten with saline prior to applying to wound bed and undermining. Pack into undermining and depth. Secondary Dressing ComfortFoam Border, 4x4 in (silicone border) Discharge Instruction: Cover with foam border or gauze and tape Secured With Compression Wrap Compression Stockings Add-Ons Electronic Signature(s) Signed: 02/20/2020 5:01:02 PM By: Baltazar Najjar MD Signed: 02/20/2020 5:39:22 PM By: Alexandria Stall Entered By: Baltazar Najjar on 02/20/2020 10:11:01 -------------------------------------------------------------------------------- Multi-Disciplinary Care Plan Details Patient Name: Date of Service: Alexandria Edward M. 02/20/2020 9:30 A M Medical Record Number: [id number] Patient Account Number: [id number] Date of Birth/Sex: Treating RN: 03/14/1970 (49 y.o. Debara Pickett, Millard.Loa Primary Care Emaya Preston: Larina Earthly, GUILFO RD Other Clinician: Referring Destini Cambre: Treating Abimelec Grochowski/Extender: Claudie Revering RTMENT, GUILFO RD Weeks in Treatment: 3 Active Inactive Nutrition Nursing Diagnoses: Impaired glucose control: actual or potential Potential for alteratiion in Nutrition/Potential for imbalanced  nutrition Goals: Patient/caregiver agrees to and verbalizes understanding of need to use nutritional supplements and/or vitamins as prescribed Date Initiated: 01/24/2020 Target Resolution Date: 04/10/2020 Goal Status: Active Patient/caregiver will maintain therapeutic glucose control Date Initiated: 01/24/2020 Target Resolution Date: 04/09/2020 Goal Status: Active Interventions: Assess HgA1c results as ordered upon admission and as needed Assess patient nutrition upon admission and as needed per policy Provide education on elevated blood sugars and impact on wound healing Provide education on nutrition Treatment Activities: Education provided on Nutrition : 01/24/2020 Notes: Wound/Skin Impairment Nursing Diagnoses: Impaired tissue integrity Knowledge deficit related to ulceration/compromised skin integrity Goals: Patient/caregiver will verbalize understanding of skin care regimen Date Initiated: 01/24/2020 Target Resolution Date: 04/09/2020 Goal Status: Active Ulcer/skin breakdown will have a volume reduction of 30% by week 4 Date Initiated: 01/24/2020 Target Resolution Date: 02/21/2020 Goal Status: Active Interventions: Assess patient/caregiver ability to obtain necessary supplies Assess patient/caregiver ability to perform ulcer/skin care regimen upon admission and as needed Assess ulceration(s) every visit Provide education on ulcer and skin care Notes: Electronic Signature(s) Signed: 02/20/2020 5:39:22 PM By: Alexandria Stall Entered By: Alexandria Stall on 02/20/2020 09:19:54 -------------------------------------------------------------------------------- Pain Assessment Details Patient Name: Date of Service: Alexandria Meyer, Alexandria Meyer 02/20/2020 9:30 A M Medical Record Number: [id number] Patient Account Number: [id number] Date of Birth/Sex: Treating RN: [birth year] (49 y.o. Wynelle Link Primary Care Moss Berry: Larina Earthly, GUILFO RD Other Clinician: Referring Harper Vandervoort: Treating  Debi Cousin/Extender: Claudie Revering RTMENT, GUILFO RD Weeks in Treatment: 3 Active Problems Location of Pain Severity and Description of Pain Patient Has Paino Yes Patient Has Paino Yes Site Locations Rate the pain. Current Pain Level: 4 Character of Pain Describe the Pain: Tender Pain Management and Medication Current Pain Management: Medication: Yes Cold Application: No Rest: No Massage: No Activity: No T.E.N.S.: No Heat Application: No Leg drop or elevation: No Is the Current Pain Management Adequate: Adequate How does your wound impact your activities of daily livingo Sleep: No Bathing: No Appetite: No Relationship With Others: No Bladder Continence: No Emotions: No Bowel Continence: No Work: No Toileting: No Drive: No Dressing: No Hobbies: No Electronic Signature(s) Signed: 02/20/2020 5:28:50 PM By: Zandra Abts RN, BSN Entered By: Zandra Abts on 02/20/2020 09:17:30 -------------------------------------------------------------------------------- Patient/Caregiver Education Details Patient Name: Date of Service: Alexandria Meyer 1/13/2022andnbsp9:30 A M Medical Record Number: [id number] Patient Account Number: [id number] Date of Birth/Gender: Treating RN: 01-24-1971 (49 y.o. Debara Pickett, Millard.Loa Primary Care Physician: Larina Earthly, GUILFO RD Other Clinician: Referring Physician: Treating Physician/Extender: Claudie Revering RTMENT, GUILFO RD Tania Ade  in Treatment: 3 Education Assessment Education Provided To: Patient Education Topics Provided Nutrition: Handouts: Elevated Blood Sugars: How Do They Affect Wound Healing, Nutrition Methods: Explain/Verbal Responses: Reinforcements needed Electronic Signature(s) Signed: 02/20/2020 5:39:22 PM By: Deon Pilling Entered By: Deon Pilling on 02/20/2020 09:31:52 -------------------------------------------------------------------------------- Wound Assessment Details Patient Name: Date of  Service: Alexandria Meyer, Alexandria Meyer. 02/20/2020 9:30 A M Medical Record Number: [id number] Patient Account Number: [id number] Date of Birth/Sex: Treating RN: [birth year] (50 y.o. Nancy Fetter Primary Care Lexa Coronado: Carron Curie, GUILFO RD Other Clinician: Referring Shakevia Sarris: Treating Jonanthan Bolender/Extender: Alvy Beal RTMENT, GUILFO RD Weeks in Treatment: 3 Wound Status Wound Number: 1 Primary Etiology: Abscess Wound Location: Left Upper Arm Wound Status: Open Wounding Event: Gradually Appeared Comorbid History: Hypertension, Type II Diabetes, Scleroderma Date Acquired: 01/03/2020 Weeks Of Treatment: 3 Clustered Wound: No Wound Measurements Length: (cm) 0.7 Width: (cm) 0.7 Depth: (cm) 2.9 Area: (cm) 0.385 Volume: (cm) 1.116 % Reduction in Area: -96.4% % Reduction in Volume: -184% Epithelialization: None Tunneling: No Undermining: Yes Starting Position (o'clock): 9 Ending Position (o'clock): 12 Maximum Distance: (cm) 2.1 Wound Description Classification: Full Thickness Without Exposed Support Structures Wound Margin: Distinct, outline attached Exudate Amount: Medium Exudate Type: Serosanguineous Exudate Color: red, brown Foul Odor After Cleansing: No Slough/Fibrino No Wound Bed Granulation Amount: Large (67-100%) Exposed Structure Granulation Quality: Red Fascia Exposed: No Necrotic Amount: None Present (0%) Fat Layer (Subcutaneous Tissue) Exposed: Yes Tendon Exposed: No Muscle Exposed: No Joint Exposed: No Bone Exposed: No Treatment Notes Wound #1 (Upper Arm) Wound Laterality: Left Cleanser Soap and Water Discharge Instruction: May shower and wash wound with dial antibacterial soap and water prior to dressing change. Peri-Wound Care Topical Primary Dressing Hydrofera Blue Classic Foam, 4x4 in Discharge Instruction: Moisten with saline prior to applying to wound bed and undermining. Pack into undermining and depth. Secondary Dressing ComfortFoam Border,  4x4 in (silicone border) Discharge Instruction: Cover with foam border or gauze and tape Secured With Compression Wrap Compression Stockings Add-Ons Electronic Signature(s) Signed: 02/20/2020 5:28:50 PM By: Levan Hurst RN, BSN Entered By: Levan Hurst on 02/20/2020 09:23:05 -------------------------------------------------------------------------------- [place] Details Patient Name: Date of Service: Alexandria Mickel Crow M. 02/20/2020 9:30 A M Medical Record Number: [id number] Patient Account Number: [id number] Date of Birth/Sex: Treating RN: 06/10/1970 (50 y.o. Nancy Fetter Primary Care Kila Godina: Carron Curie, GUILFO RD Other Clinician: Referring Shaunn Tackitt: Treating Wilberth Damon/Extender: Alvy Beal RTMENT, GUILFO RD Weeks in Treatment: 3 Vital Signs Time Taken: 09:16 Temperature (F): 98.2 Height (in): 62 Pulse (bpm): 77 Weight (lbs): 215 Respiratory Rate (breaths/min): 18 Body Mass Index (BMI): 39.3 Blood Pressure (mmHg): 161/91 Reference Range: 80 - 120 mg / dl Electronic Signature(s) Signed: 02/20/2020 5:28:50 PM By: Levan Hurst RN, BSN Entered By: Levan Hurst on 02/20/2020 09:17:12

## 2020-02-20 NOTE — Progress Notes (Signed)
Alexandria Meyer, Alexandria Meyer ([id number]) Visit Report for 02/20/2020 HPI Details Patient Name: Date of Service: Alexandria Meyer, Alexandria Meyer. 02/20/2020 9:30 A M Medical Record Number: [id number] Patient Account Number: [id number] Date of Birth/Sex: Treating RN: 1970/11/08 (50 y.o. Debby Bud Primary Care Provider: Carron Curie, GUILFO RD Other Clinician: Referring Provider: Treating Provider/Extender: Alvy Beal RTMENT, GUILFO RD Weeks in Treatment: 3 History of Present Illness HPI Description: ADMISSION 01/24/2020 This is a 50 year old woman with type 2 diabetes although in some parts of the epic notes she is listed as a type I diabetic. She is referred here I believe from her primary care provider for a draining wound on the left deltoid area. This apparently started sometime last month as an itchy area that she scratched. It is gradually expanded. She has been to urgent care on 11/26 and several times subsequently. The area was IandD but I do not see the cultures were done in the urgent care. She was initially put on Bactrim and doxycycline but she did not tolerate this. She subsequently went back to primary care at [facility] There is a relatively complete note from them dated 12/1. Apparently a culture was done at this time although I do not see that specific result. She was put on oral vancomycin 250 mg 3 times daily the doxycycline and sulfa antibiotic were stopped apparently the doxycycline causing nausea and the sulfa antibiotic would have been "ineffective" is what the patient remembers being told. 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 The patient describes a lot of pain rating 7-8 out of 10 she also has limitation of range of the shoulder although some of this may be chronic. Past medical history type 2 diabetes on insulin, hypertension, psoriasis, history of abscesses 12/23; patient is on clindamycin. Culture I did last  time showed anaerobic bacteria often associated with bite injuries but patient denies any such history. She is tolerating clindamycin well with no side effects in particular diarrhea. There is some improvement in the pain in measurements of the wound but still purulent drainage I was a bit disappointed about this. X-rays of the area were negative. The particular anaerobic bacteria cultured this time was Eikenella Corredens 1/6; not much change in this. Still a deep tunneling wound. There is less purulence but still drainage. There is erythema around the wound but less palpable tenderness. She is just starting the second prescription of clindamycin. I think there was a gap between the 2 prescriptions where she was not taking it 1/13; depth of this at 2.3 cm measured myself. There is no purulent drainage what I can see of the wound bed looks healthy. The sinus tract does not go further than the 2.3 cm I'm able to probe. She is still finishing the clindamycin that I gave her. The erythema around the area and the tenderness is better. This is over the distal part of the deltoid. What really concerns me is the limited range of motion she has in the left shoulder with a lot of pain. She is describes her self as having osteoarthritis. There is significant tenderness along the joint line anteriorly. She does not have range of motion in the abduction and flexion. In fact I wonder whether she has any abduction in the shoulder beyond the first 15 or 20 degrees Electronic Signature(s) Signed: 02/20/2020 5:01:02 PM By: Linton Ham MD Entered By: Linton Ham on 02/20/2020 10:13:17 -------------------------------------------------------------------------------- Physical Exam Details Patient  Name: Date of Service: Alexandria Alexandria, Meyer. 02/20/2020 9:30 A M Medical Record Number: [id number] Patient Account Number: [id number] Date of Birth/Sex: Treating RN: 09/20/1970 (50 y.o. Debby Bud Primary Care  Provider: Carron Curie, GUILFO RD Other Clinician: Referring Provider: Treating Provider/Extender: Alvy Beal RTMENT, GUILFO RD Weeks in Treatment: 3 Constitutional Patient is hypertensive.. Pulse regular and within target range for patient.Marland Kitchen Respirations regular, non-labored and within target range.. Temperature is normal and within the target range for the patient.Marland Kitchen Appears in no distress. Musculoskeletal Left shoulder very limited abduction and flexion. Tenderness along the anterior joint line which is well removed from her wound. Notes Wound exam; left deltoid the depth of this is 2.3 cm this is not improved although there is certainly less erythema around the wound and no purulent drainage. Electronic Signature(s) Signed: 02/20/2020 5:01:02 PM By: Linton Ham MD Entered By: Linton Ham on 02/20/2020 10:16:27 -------------------------------------------------------------------------------- Physician Orders Details Patient Name: Date of Service: Alexandria Reap M. 02/20/2020 9:30 A M Medical Record Number: [id number] Patient Account Number: [id number] Date of Birth/Sex: Treating RN: May 17, 1970 (50 y.o. Debby Bud Primary Care Provider: Carron Curie, GUILFO RD Other Clinician: Referring Provider: Treating Provider/Extender: Alvy Beal RTMENT, GUILFO RD Weeks in Treatment: 3 Verbal / Phone Orders: No Diagnosis Coding ICD-10 Coding Code Description L02.818 Cutaneous abscess of other sites L98.498 Non-pressure chronic ulcer of skin of other sites with other specified severity Z86.14 Personal history of Methicillin resistant Staphylococcus aureus infection M25.512 Pain in left shoulder Follow-up Appointments ppointment in 2 weeks. - Thursday Return A Bathing/ Shower/ Hygiene May shower and wash wound with soap and water. Additional Orders / Instructions Other: - ensure to practice range of motion of left arm and shoulder. Wound Treatment Wound  #1 - Upper Arm Wound Laterality: Left Cleanser: Soap and Water Every Other Day/7 Days Discharge Instructions: May shower and wash wound with dial antibacterial soap and water prior to dressing change. Prim Dressing: Hydrofera Blue Classic Foam, 4x4 in Every Other Day/7 Days ary Discharge Instructions: Moisten with saline prior to applying to wound bed and undermining. Pack into undermining and depth. Secondary Dressing: ComfortFoam Border, 4x4 in (silicone border) Every Other Day/7 Days Discharge Instructions: Cover with foam border or gauze and tape [facility]- [facility] - Referral to [facility] related to non-healing left upper arm wound with induration to periwound, with pain to left shoulder/joint area. - (ICD10 L02.818 - Cutaneous abscess of other sites) Electronic Signature(s) Signed: 02/20/2020 5:01:02 PM By: Linton Ham MD Signed: 02/20/2020 5:39:22 PM By: Deon Pilling Entered By: Deon Pilling on 02/20/2020 09:47:36 Prescription 02/20/2020 -------------------------------------------------------------------------------- Szatkowski, Honor Junes. Linton Ham MD Patient Name: Provider: 04-Dec-1970 [id number] Date of Birth: NPI#: [id number] Sex: DEA #: [phone] [id number] Phone #: License #: [place] Patient Address: [address] [place], [place] [address] [place], [place] [address] [phone] Allergies penicillin; lisinopril Provider's Orders orthopedics- [facility] - ICD10: L02.818 - Referral to [facility] related to non-healing left upper arm wound with induration to periwound, with pain to left shoulder/joint area. Hand Signature: Date(s): Electronic Signature(s) Signed: 02/20/2020 5:01:02 PM By: Linton Ham MD Signed: 02/20/2020 5:39:22 PM By: Deon Pilling Entered By: Deon Pilling on 02/20/2020  09:47:36 -------------------------------------------------------------------------------- Problem List Details Patient Name: Date of Service: Alexandria Reap M. 02/20/2020 9:30 A M Medical Record Number: [id number] Patient Account Number: [id number] Date of Birth/Sex: Treating RN: 12-29-1970 (50 y.o. F) Deaton, Atalissa  Primary Care Provider: Carron Curie, GUILFO RD Other Clinician: Referring Provider: Treating Provider/Extender: Alvy Beal RTMENT, GUILFO RD Weeks in Treatment: 3 Active Problems ICD-10 Encounter Code Description Active Date MDM Diagnosis L02.818 Cutaneous abscess of other sites 01/24/2020 No Yes L98.498 Non-pressure chronic ulcer of skin of other sites with other specified severity 01/24/2020 No Yes Z86.14 Personal history of Methicillin resistant Staphylococcus aureus infection 01/24/2020 No Yes M25.512 Pain in left shoulder 01/24/2020 No Yes Inactive Problems Resolved Problems Electronic Signature(s) Signed: 02/20/2020 5:01:02 PM By: Linton Ham MD Signed: 02/20/2020 5:01:02 PM By: Linton Ham MD Entered By: Linton Ham on 02/20/2020 10:10:55 -------------------------------------------------------------------------------- Progress Note Details Patient Name: Date of Service: Alexandria Reap M. 02/20/2020 9:30 A M Medical Record Number: [id number] Patient Account Number: [id number] Date of Birth/Sex: Treating RN: 04-29-1970 (50 y.o. Helene Shoe, Meta.Reding Primary Care Provider: Carron Curie, GUILFO RD Other Clinician: Referring Provider: Treating Provider/Extender: Alvy Beal RTMENT, GUILFO RD Weeks in Treatment: 3 Subjective History of Present Illness (HPI) ADMISSION 01/24/2020 This is a 50 year old woman with type 2 diabetes although in some parts of the epic notes she is listed as a type I diabetic. She is referred here I believe from her primary care provider for a draining wound on the left deltoid area. This apparently started  sometime last month as an itchy area that she scratched. It is gradually expanded. She has been to urgent care on 11/26 and several times subsequently. The area was IandD but I do not see the cultures were done in the urgent care. She was initially put on Bactrim and doxycycline but she did not tolerate this. She subsequently went back to primary care at [facility] There is a relatively complete note from them dated 12/1. Apparently a culture was done at this time although I do not see that specific result. She was put on oral vancomycin 250 mg 3 times daily the doxycycline and sulfa antibiotic were stopped apparently the doxycycline causing nausea and the sulfa antibiotic would have been "ineffective" is what the patient remembers being told. 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 The patient describes a lot of pain rating 7-8 out of 10 she also has limitation of range of the shoulder although some of this may be chronic. Past medical history type 2 diabetes on insulin, hypertension, psoriasis, history of abscesses 12/23; patient is on clindamycin. Culture I did last time showed anaerobic bacteria often associated with bite injuries but patient denies any such history. She is tolerating clindamycin well with no side effects in particular diarrhea. There is some improvement in the pain in measurements of the wound but still purulent drainage I was a bit disappointed about this. X-rays of the area were negative. The particular anaerobic bacteria cultured this time was Eikenella Corredens 1/6; not much change in this. Still a deep tunneling wound. There is less purulence but still drainage. There is erythema around the wound but less palpable tenderness. She is just starting the second prescription of clindamycin. I think there was a gap between the 2 prescriptions where she was not taking it 1/13; depth of this at 2.3 cm measured myself.  There is no purulent drainage what I can see of the wound bed looks healthy. The sinus tract does not go further than the 2.3 cm I'm able to probe. She is still finishing the clindamycin that I gave her. The erythema around the area and the tenderness  is better. This is over the distal part of the deltoid. What really concerns me is the limited range of motion she has in the left shoulder with a lot of pain. She is describes her self as having osteoarthritis. There is significant tenderness along the joint line anteriorly. She does not have range of motion in the abduction and flexion. In fact I wonder whether she has any abduction in the shoulder beyond the first 15 or 20 degrees Objective Constitutional Patient is hypertensive.. Pulse regular and within target range for patient.Marland Kitchen Respirations regular, non-labored and within target range.. Temperature is normal and within the target range for the patient.Marland Kitchen Appears in no distress. Vitals Time Taken: 9:16 AM, Height: 62 in, Weight: 215 lbs, BMI: 39.3, Temperature: 98.2 F, Pulse: 77 bpm, Respiratory Rate: 18 breaths/min, Blood Pressure: 161/91 mmHg. Musculoskeletal Left shoulder very limited abduction and flexion. Tenderness along the anterior joint line which is well removed from her wound. General Notes: Wound exam; left deltoid the depth of this is 2.3 cm this is not improved although there is certainly less erythema around the wound and no purulent drainage. Integumentary (Hair, Skin) Wound #1 status is Open. Original cause of wound was Gradually Appeared. The wound is located on the Left Upper Arm. The wound measures 0.7cm length x 0.7cm width x 2.9cm depth; 0.385cm^2 area and 1.116cm^3 volume. There is Fat Layer (Subcutaneous Tissue) exposed. There is no tunneling noted, however, there is undermining starting at 9:00 and ending at 12:00 with a maximum distance of 2.1cm. There is a medium amount of serosanguineous drainage noted. The wound  margin is distinct with the outline attached to the wound base. There is large (67-100%) red granulation within the wound bed. There is no necrotic tissue within the wound bed. 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 - Thursday Bathing/ Shower/ Hygiene: May shower and wash wound with soap and water. Additional Orders / Instructions: Other: - ensure to practice range of motion of left arm and shoulder. ordered were: orthopedics- [facility] - Referral to [facility] related to non-healing left upper arm wound with induration to periwound, with pain to left shoulder/joint area. WOUND #1: - Upper Arm Wound Laterality: Left Cleanser: Soap and Water Every Other Day/7 Days Discharge Instructions: May shower and wash wound with dial antibacterial soap and water prior to dressing change. Prim Dressing: Hydrofera Blue Classic Foam, 4x4 in Every Other Day/7 Days ary Discharge Instructions: Moisten with saline prior to applying to wound bed and undermining. Pack into undermining and depth. Secondary Dressing: ComfortFoam Border, 4x4 in (silicone border) Every Other Day/7 Days Discharge Instructions: Cover with foam border or gauze and tape 1. I continued with the Hydrofera Blue rope. We're trying to make application for compassionate release of wound care products since Medicaid does not cover any of these 2. I'm increasingly concerned about the shoulder itself. Certainly a draining sinus with a deep underlying infection and limited range of motion in the shoulder is concerning that there could be a communicating joint space infection. I'm going to send her to orthopedics, I suspect she'll need an MRI. 3. I have not ordered any further  antibiotics 4. She clearly had purulent drainage coming out of this when she came here. We did the culture as noted and I have given her 2  weeks of clindamycin. She has tolerated this well. She is not systemically unwell Electronic Signature(s) Signed: 02/20/2020 5:01:02 PM By: Linton Ham MD Entered By: Linton Ham on 02/20/2020 10:21:02 -------------------------------------------------------------------------------- SuperBill Details Patient Name: Date of Service: Alexandria Reap M. 02/20/2020 Medical Record Number: [id number] Patient Account Number: [id number] Date of Birth/Sex: Treating RN: [birth year] (50 y.o. Helene Shoe, Meta.Reding Primary Care Provider: Carron Curie, GUILFO RD Other Clinician: Referring Provider: Treating Provider/Extender: Alvy Beal RTMENT, GUILFO RD Weeks in Treatment: 3 Diagnosis Coding ICD-10 Codes Code Description L02.818 Cutaneous abscess of other sites L98.498 Non-pressure chronic ulcer of skin of other sites with other specified severity Z86.14 Personal history of Methicillin resistant Staphylococcus aureus infection M25.512 Pain in left shoulder Facility Procedures Physician Procedures : CPT4 Code Description Modifier [id number] - WC PHYS LEVEL 3 - EST PT ICD-10 Diagnosis Description L02.818 Cutaneous abscess of other sites L98.498 Non-pressure chronic ulcer of skin of other sites with other specified severity Z86.14 Personal  history of Methicillin resistant Staphylococcus aureus infection Quantity: 1 Electronic Signature(s) Signed: 02/20/2020 5:01:02 PM By: Linton Ham MD Entered By: Linton Ham on 02/20/2020 10:21:21

## 2020-03-05 ENCOUNTER — Encounter (HOSPITAL_BASED_OUTPATIENT_CLINIC_OR_DEPARTMENT_OTHER): Payer: Medicaid Other | Admitting: Internal Medicine

## 2020-03-05 ENCOUNTER — Other Ambulatory Visit: Payer: Self-pay

## 2020-03-05 DIAGNOSIS — L02414 Cutaneous abscess of left upper limb: Secondary | ICD-10-CM | POA: Diagnosis not present

## 2020-03-05 NOTE — Progress Notes (Signed)
HUMNA, MOOREHOUSE ([id number]) Visit Report for 03/05/2020 Arrival Information Details Patient Name: Date of Service: HA ANNISTON, NELLUMS. 03/05/2020 9:30 A M Medical Record Number: [id number] Patient Account Number: [id number] Date of Birth/Sex: Treating RN: 1970-07-17 (50 y.o. Helene Shoe, Meta.Reding Primary Care Hussam Muniz: Louanne Skye RTMENT, GUILFO RD Other Clinician: Referring Sonnie Bias: Treating Salvatrice Morandi/Extender: Alvy Beal RTMENT, GUILFO RD Weeks in Treatment: 5 Visit Information History Since Last Visit Added or deleted any medications: No Patient Arrived: Ambulatory Any new allergies or adverse reactions: No Arrival Time: 09:30 Had a fall or experienced change in No Accompanied By: self activities of daily living that may affect Transfer Assistance: None risk of falls: Patient Identification Verified: Yes Signs or symptoms of abuse/neglect since last visito No Secondary Verification Process Completed: Yes Hospitalized since last visit: No Patient Requires Transmission-Based Precautions: No Implantable device outside of the clinic excluding No Patient Has Alerts: No cellular tissue based products placed in the center since last visit: Has Dressing in Place as Prescribed: Yes Pain Present Now: No Electronic Signature(s) Signed: 03/05/2020 3:17:01 PM By: Sandre Kitty Entered By: Sandre Kitty on 03/05/2020 09:30:38 -------------------------------------------------------------------------------- Clinic Level of Care Assessment Details Patient Name: Date of Service: HA CONNIE, LASATER. 03/05/2020 9:30 A M Medical Record Number: [id number] Patient Account Number: [id number] Date of Birth/Sex: Treating RN: 08-21-1970 (50 y.o. Helene Shoe, Meta.Reding Primary Care Sway Guttierrez: Carron Curie, GUILFO RD Other Clinician: Referring Maile Linford: Treating Chalet Kerwin/Extender: Alvy Beal RTMENT, GUILFO RD Weeks in Treatment: 5 Clinic Level of Care Assessment Items TOOL 4 Quantity  Score X- 1 0 Use when only an EandM is performed on FOLLOW-UP visit ASSESSMENTS - Nursing Assessment / Reassessment X- 1 10 Reassessment of Co-morbidities (includes updates in patient status) X- 1 5 Reassessment of Adherence to Treatment Plan ASSESSMENTS - Wound and Skin A ssessment / Reassessment X - Simple Wound Assessment / Reassessment - one wound 1 5 []  - 0 Complex Wound Assessment / Reassessment - multiple wounds X- 1 10 Dermatologic / Skin Assessment (not related to wound area) ASSESSMENTS - Focused Assessment []  - 0 Circumferential Edema Measurements - multi extremities X- 1 10 Nutritional Assessment / Counseling / Intervention []  - 0 Lower Extremity Assessment (monofilament, tuning fork, pulses) []  - 0 Peripheral Arterial Disease Assessment (using hand held doppler) ASSESSMENTS - Ostomy and/or Continence Assessment and Care []  - 0 Incontinence Assessment and Management []  - 0 Ostomy Care Assessment and Management (repouching, etc.) PROCESS - Coordination of Care X - Simple Patient / Family Education for ongoing care 1 15 []  - 0 Complex (extensive) Patient / Family Education for ongoing care X- 1 10 Staff obtains Programmer, systems, Records, T Results / Process Orders est []  - 0 Staff telephones HHA, Nursing Homes / Clarify orders / etc []  - 0 Routine Transfer to another Facility (non-emergent condition) []  - 0 Routine Hospital Admission (non-emergent condition) []  - 0 New Admissions / Biomedical engineer / Ordering NPWT Apligraf, etc. , []  - 0 Emergency Hospital Admission (emergent condition) X- 1 10 Simple Discharge Coordination []  - 0 Complex (extensive) Discharge Coordination PROCESS - Special Needs []  - 0 Pediatric / Minor Patient Management []  - 0 Isolation Patient Management []  - 0 Hearing / Language / Visual special needs []  - 0 Assessment of Community assistance (transportation, D/C planning, etc.) []  - 0 Additional assistance / Altered  mentation []  - 0 Support Surface(s) Assessment (bed, cushion, seat, etc.) INTERVENTIONS - Wound Cleansing / Measurement X - Simple Wound Cleansing - one wound 1 5 []  -  0 Complex Wound Cleansing - multiple wounds X- 1 5 Wound Imaging (photographs - any number of wounds) []  - 0 Wound Tracing (instead of photographs) X- 1 5 Simple Wound Measurement - one wound []  - 0 Complex Wound Measurement - multiple wounds INTERVENTIONS - Wound Dressings []  - 0 Small Wound Dressing one or multiple wounds []  - 0 Medium Wound Dressing one or multiple wounds []  - 0 Large Wound Dressing one or multiple wounds []  - 0 Application of Medications - topical []  - 0 Application of Medications - injection INTERVENTIONS - Miscellaneous []  - 0 External ear exam []  - 0 Specimen Collection (cultures, biopsies, blood, body fluids, etc.) []  - 0 Specimen(s) / Culture(s) sent or taken to Lab for analysis []  - 0 Patient Transfer (multiple staff / Civil Service fast streamer / Similar devices) []  - 0 Simple Staple / Suture removal (25 or less) []  - 0 Complex Staple / Suture removal (26 or more) []  - 0 Hypo / Hyperglycemic Management (close monitor of Blood Glucose) []  - 0 Ankle / Brachial Index (ABI) - do not check if billed separately X- 1 5 Vital Signs Has the patient been seen at the hospital within the last three years: Yes Total Score: 95 Level Of Care: New/Established - Level 3 Electronic Signature(s) Signed: 03/05/2020 5:42:01 PM By: Deon Pilling Entered By: Deon Pilling on 03/05/2020 10:22:20 -------------------------------------------------------------------------------- Encounter Discharge Information Details Patient Name: Date of Service: HA Mickel Crow M. 03/05/2020 9:30 A M Medical Record Number: [id number] Patient Account Number: [id number] Date of Birth/Sex: Treating RN: [birth year] (50 y.o. Debby Bud Primary Care Hendrix Yurkovich: Carron Curie, GUILFO RD Other Clinician: Referring  Merrick Maggio: Treating Jalissa Heinzelman/Extender: Alvy Beal RTMENT, GUILFO RD Weeks in Treatment: 5 Encounter Discharge Information Items Discharge Condition: Stable Ambulatory Status: Ambulatory Discharge Destination: Home Transportation: Private Auto Accompanied By: self Schedule Follow-up Appointment: No Clinical Summary of Care: Electronic Signature(s) Signed: 03/05/2020 5:42:01 PM By: Deon Pilling Entered By: Deon Pilling on 03/05/2020 10:25:19 -------------------------------------------------------------------------------- Multi Wound Chart Details Patient Name: Date of Service: Lamonte Sakai Mickel Crow M. 03/05/2020 9:30 A M Medical Record Number: [id number] Patient Account Number: [id number] Date of Birth/Sex: Treating RN: 01/30/1971 (50 y.o. Helene Shoe, Meta.Reding Primary Care Tori Cupps: Carron Curie, GUILFO RD Other Clinician: Referring Arius Harnois: Treating Levora Werden/Extender: Alvy Beal RTMENT, GUILFO RD Weeks in Treatment: 5 Vital Signs Height(in): 62 Pulse(bpm): 77 Weight(lbs): 215 Blood Pressure(mmHg): 145/83 Body Mass Index(BMI): 39 Temperature(F): 97.6 Respiratory Rate(breaths/min): 18 Photos: [1:No Photos Left Upper Arm] [N/A:N/A N/A] Wound Location: [1:Gradually Appeared] [N/A:N/A] Wounding Event: [1:Abscess] [N/A:N/A] Primary Etiology: [1:Hypertension, Type II Diabetes,] [N/A:N/A] Comorbid History: [1:Scleroderma 01/03/2020] [N/A:N/A] Date Acquired: [1:5] [N/A:N/A] Weeks of Treatment: [1:Open] [N/A:N/A] Wound Status: [1:0x0x0] [N/A:N/A] Measurements L x W x D (cm) [1:0] [N/A:N/A] A (cm) : rea [1:0] [N/A:N/A] Volume (cm) : [1:100.00%] [N/A:N/A] % Reduction in Area: [1:100.00%] [N/A:N/A] % Reduction in Volume: [1:Full Thickness Without Exposed] [N/A:N/A] Classification: [1:Support Structures None Present] [N/A:N/A] Exudate Amount: [1:Distinct, outline attached] [N/A:N/A] Wound Margin: [1:None Present (0%)] [N/A:N/A] Granulation Amount: [1:None Present  (0%)] [N/A:N/A] Necrotic Amount: [1:Fascia: No] [N/A:N/A] Exposed Structures: [1:Fat Layer (Subcutaneous Tissue): No Tendon: No Muscle: No Joint: No Bone: No Large (67-100%)] [N/A:N/A] Treatment Notes Electronic Signature(s) Signed: 03/05/2020 4:56:03 PM By: Linton Ham MD Signed: 03/05/2020 5:42:01 PM By: Deon Pilling Entered By: Linton Ham on 03/05/2020 10:55:06 -------------------------------------------------------------------------------- Multi-Disciplinary Care Plan Details Patient Name: Date of Service: [name] 03/05/2020 9:30 A M Medical Record Number: [id number] Patient Account Number: [id number] Date of Birth/Sex: Treating RN:  [birth year] (50 y.o. Debby Bud Primary Care Yaneth Fairbairn: Carron Curie, GUILFO RD Other Clinician: Referring Sawsan Riggio: Treating Sheletha Bow/Extender: Alvy Beal RTMENT, GUILFO RD Weeks in Treatment: 5 Active Inactive Electronic Signature(s) Signed: 03/05/2020 5:42:01 PM By: Deon Pilling Entered By: Deon Pilling on 03/05/2020 10:22:31 -------------------------------------------------------------------------------- Pain Assessment Details Patient Name: Date of Service: HA HEVEN, DANSEREAU. 03/05/2020 9:30 A M Medical Record Number: [id number] Patient Account Number: [id number] Date of Birth/Sex: Treating RN: 1970/10/08 (50 y.o. Debby Bud Primary Care Amaia Lavallie: Carron Curie, GUILFO RD Other Clinician: Referring Norton Bivins: Treating Kasaundra Fahrney/Extender: Alvy Beal RTMENT, GUILFO RD Weeks in Treatment: 5 Active Problems Location of Pain Severity and Description of Pain Patient Has Paino No Site Locations Pain Management and Medication Current Pain Management: Electronic Signature(s) Signed: 03/05/2020 3:17:01 PM By: Sandre Kitty Signed: 03/05/2020 5:42:01 PM By: Deon Pilling Entered By: Sandre Kitty on 03/05/2020  09:31:53 -------------------------------------------------------------------------------- Patient/Caregiver Education Details Patient Name: Date of Service: HA Anders Simmonds 1/27/2022andnbsp9:30 A M Medical Record Number: [id number] Patient Account Number: [id number] Date of Birth/Gender: Treating RN: 25-Aug-1970 (50 y.o. Debby Bud Primary Care Physician: Carron Curie, GUILFO RD Other Clinician: Referring Physician: Treating Physician/Extender: Alvy Beal RTMENT, GUILFO RD Weeks in Treatment: 5 Education Assessment Education Provided To: Patient Education Topics Provided Wound/Skin Impairment: Handouts: Skin Care Do's and Dont's Methods: Explain/Verbal Responses: Reinforcements needed Electronic Signature(s) Signed: 03/05/2020 5:42:01 PM By: Deon Pilling Entered By: Deon Pilling on 03/05/2020 10:21:51 -------------------------------------------------------------------------------- Wound Assessment Details Patient Name: Date of Service: HA ISRAELA, KELTING 03/05/2020 9:30 A M Medical Record Number: [id number] Patient Account Number: [id number] Date of Birth/Sex: Treating RN: 01-14-1971 (50 y.o. Helene Shoe, Meta.Reding Primary Care Masiya Claassen: Carron Curie, GUILFO RD Other Clinician: Referring Reem Fleury: Treating Elven Laboy/Extender: Alvy Beal RTMENT, GUILFO RD Weeks in Treatment: 5 Wound Status Wound Number: 1 Primary Etiology: Abscess Wound Location: Left Upper Arm Wound Status: Open Wounding Event: Gradually Appeared Comorbid History: Hypertension, Type II Diabetes, Scleroderma Date Acquired: 01/03/2020 Weeks Of Treatment: 5 Clustered Wound: No Wound Measurements Length: (cm) Width: (cm) Depth: (cm) Area: (cm) Volume: (cm) 0 % Reduction in Area: 100% 0 % Reduction in Volume: 100% 0 Epithelialization: Large (67-100%) 0 Tunneling: No 0 Undermining: No Wound Description Classification: Full Thickness Without Exposed Support Structures Wound  Margin: Distinct, outline attached Exudate Amount: None Present Foul Odor After Cleansing: No Slough/Fibrino No Wound Bed Granulation Amount: None Present (0%) Exposed Structure Necrotic Amount: None Present (0%) Fascia Exposed: No Fat Layer (Subcutaneous Tissue) Exposed: No Tendon Exposed: No Muscle Exposed: No Joint Exposed: No Bone Exposed: No Electronic Signature(s) Signed: 03/05/2020 5:42:01 PM By: Deon Pilling Signed: 03/05/2020 5:48:06 PM By: Levan Hurst RN, BSN Entered By: Levan Hurst on 03/05/2020 09:45:16 -------------------------------------------------------------------------------- [place] Details Patient Name: Date of Service: HA Mickel Crow M. 03/05/2020 9:30 A M Medical Record Number: [id number] Patient Account Number: [id number] Date of Birth/Sex: Treating RN: January 07, 1971 (50 y.o. Helene Shoe, Meta.Reding Primary Care Modell Fendrick: Carron Curie, GUILFO RD Other Clinician: Referring Esra Frankowski: Treating Malahki Gasaway/Extender: Alvy Beal RTMENT, GUILFO RD Weeks in Treatment: 5 Vital Signs Time Taken: 09:31 Temperature (F): 97.6 Height (in): 62 Pulse (bpm): 77 Weight (lbs): 215 Respiratory Rate (breaths/min): 18 Body Mass Index (BMI): 39.3 Blood Pressure (mmHg): 145/83 Reference Range: 80 - 120 mg / dl Electronic Signature(s) Signed: 03/05/2020 3:17:01 PM By: Sandre Kitty Entered By: Sandre Kitty on 03/05/2020 09:31:47

## 2020-03-05 NOTE — Progress Notes (Signed)
Alexand[place], Meyer ([id number]) Visit Report for 03/05/2020 HPI Details Patient Name: Date of Service: Alexandria VENNELA, Meyer. 03/05/2020 9:30 A M Medical Record Number: [id number] Patient Account Number: [id number] Date of Birth/Sex: Treating RN: December 18, 1970 (50 y.o. Alexandria Meyer Primary Care Provider: Carron Curie, GUILFO RD Other Clinician: Referring Provider: Treating Provider/Extender: Alvy Beal RTMENT, GUILFO RD Weeks in Treatment: 5 History of Present Illness HPI Description: ADMISSION 01/24/2020 This is a 50 year old woman with type 2 diabetes although in some parts of the epic notes she is listed as a type I diabetic. She is referred here I believe from her primary care provider for a draining wound on the left deltoid area. This apparently started sometime last month as an itchy area that she scratched. It is gradually expanded. She has been to urgent care on 11/26 and several times subsequently. The area was IandD but I do not see the cultures were done in the urgent care. She was initially put on Bactrim and doxycycline but she did not tolerate this. She subsequently went back to primary care at "[facility]". There is a relatively complete note from them dated 12/1. Apparently a culture was done at this time although I do not see that specific result. She was put on oral vancomycin 250 mg 3 times daily the doxycycline and sulfa antibiotic were stopped apparently the doxycycline causing nausea and the sulfa antibiotic would have been "ineffective" is what the patient remembers being told. 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 The patient describes a lot of pain rating 7-8 out of 10 she also has limitation of range of the shoulder although some of this may be chronic. Past medical history type 2 diabetes on insulin, hypertension, psoriasis, history of abscesses 12/23; patient is on clindamycin. Culture I did last  time showed anaerobic bacteria often associated with bite injuries but patient denies any such history. She is tolerating clindamycin well with no side effects in particular diarrhea. There is some improvement in the pain in measurements of the wound but still purulent drainage I was a bit disappointed about this. X-rays of the area were negative. The particular anaerobic bacteria cultured this time was Eikenella Corredens 1/6; not much change in this. Still a deep tunneling wound. There is less purulence but still drainage. There is erythema around the wound but less palpable tenderness. She is just starting the second prescription of clindamycin. I think there was a gap between the 2 prescriptions where she was not taking it 1/13; depth of this at 2.3 cm measured myself. There is no purulent drainage what I can see of the wound bed looks healthy. The sinus tract does not go further than the 2.3 cm I'm able to probe. She is still finishing the clindamycin that I gave her. The erythema around the area and the tenderness is better. This is over the distal part of the deltoid. What really concerns me is the limited range of motion she has in the left shoulder with a lot of pain. She is describes her self as having osteoarthritis. There is significant tenderness along the joint line anteriorly. She does not have range of motion in the abduction and flexion. In fact I wonder whether she has any abduction in the shoulder beyond the first 15 or 20 degrees 1/27; somewhat surprisingly the patient comes in with the probing area in her left deltoid totally epithelialized. The skin goes down into  this but spreading apart it is fully epithelialized there is a divot here. Somewhat unusual discoloration of the surrounding skin from this presumed abscess however there is absolutely no tenderness no crepitus. I tried to refer her to orthopedic surgery last time she is still not heard from them [[facility]] we gave her  their number to follow-up with them. Electronic Signature(s) Signed: 03/05/2020 4:56:03 PM By: Linton Ham MD Entered By: Linton Ham on 03/05/2020 10:56:14 -------------------------------------------------------------------------------- Physical Exam Details Patient Name: Date of Service: Alexandria Reap M. 03/05/2020 9:30 A M Medical Record Number: [id number] Patient Account Number: [id number] Date of Birth/Sex: Treating RN: 06/09/1970 (50 y.o. Alexandria Meyer Primary Care Provider: Carron Curie, GUILFO RD Other Clinician: Referring Provider: Treating Provider/Extender: Alvy Beal RTMENT, GUILFO RD Weeks in Treatment: 5 Constitutional Patient is hypertensive.. Pulse regular and within target range for patient.Marland Kitchen Respirations regular, non-labored and within target range.. Temperature is normal and within the target range for the patient.Marland Kitchen Appears in no distress. Notes Wound exam; left deltoid region. A little small divot remains what was left of this deep probing area. All of this is epithelialized. I think the wound is "healed". This was originally an abscess. Surrounding skin still looks darker than her normal scan but there is absolutely no tenderness. Electronic Signature(s) Signed: 03/05/2020 4:56:03 PM By: Linton Ham MD Entered By: Linton Ham on 03/05/2020 10:57:23 -------------------------------------------------------------------------------- Physician Orders Details Patient Name: Date of Service: Alexandria Reap M. 03/05/2020 9:30 A M Medical Record Number: [id number] Patient Account Number: [id number] Date of Birth/Sex: Treating RN: [birth year] (50 y.o. Alexandria Meyer Primary Care Provider: Carron Curie, GUILFO RD Other Clinician: Referring Provider: Treating Provider/Extender: Alvy Beal RTMENT, GUILFO RD Weeks in Treatment: 5 Verbal / Phone Orders: No Diagnosis Coding ICD-10 Coding Code Description L02.818 Cutaneous abscess of  other sites L98.498 Non-pressure chronic ulcer of skin of other sites with other specified severity Z86.14 Personal history of Methicillin resistant Staphylococcus aureus infection M25.512 Pain in left shoulder Discharge From [facility] Services Discharge from [place] - Call if any future wound care needs. Patient to follow up with Maylon Cos related to left shoulder. Electronic Signature(s) Signed: 03/05/2020 4:56:03 PM By: Linton Ham MD Signed: 03/05/2020 5:42:01 PM By: Deon Pilling Entered By: Deon Pilling on 03/05/2020 10:23:18 -------------------------------------------------------------------------------- Problem List Details Patient Name: Date of Service: Alexandria Mickel Crow M. 03/05/2020 9:30 A M Medical Record Number: [id number] Patient Account Number: [id number] Date of Birth/Sex: Treating RN: April 16, 1970 (50 y.o. Alexandria Meyer, Alexandria Meyer Primary Care Provider: Carron Curie, GUILFO RD Other Clinician: Referring Provider: Treating Provider/Extender: Alvy Beal RTMENT, GUILFO RD Weeks in Treatment: 5 Active Problems ICD-10 Encounter Code Description Active Date MDM Diagnosis L02.818 Cutaneous abscess of other sites 01/24/2020 No Yes L98.498 Non-pressure chronic ulcer of skin of other sites with other specified severity 01/24/2020 No Yes Z86.14 Personal history of Methicillin resistant Staphylococcus aureus infection 01/24/2020 No Yes M25.512 Pain in left shoulder 01/24/2020 No Yes Inactive Problems Resolved Problems Electronic Signature(s) Signed: 03/05/2020 4:56:03 PM By: Linton Ham MD Entered By: Linton Ham on 03/05/2020 10:54:58 -------------------------------------------------------------------------------- Progress Note Details Patient Name: Date of Service: Alexandria Reap M. 03/05/2020 9:30 A M Medical Record Number: [id number] Patient Account Number: [id number] Date of Birth/Sex: Treating RN: 12-19-1970 (50 y.o. Alexandria Meyer Primary Care  Provider: Carron Curie, GUILFO RD Other Clinician: Referring Provider: Treating Provider/Extender: Alvy Beal RTMENT, GUILFO RD Weeks in Treatment: 5 Subjective History of Present Illness (HPI) ADMISSION 01/24/2020 This is a 50 year old  woman with type 2 diabetes although in some parts of the epic notes she is listed as a type I diabetic. She is referred here I believe from her primary care provider for a draining wound on the left deltoid area. This apparently started sometime last month as an itchy area that she scratched. It is gradually expanded. She has been to urgent care on 11/26 and several times subsequently. The area was IandD but I do not see the cultures were done in the urgent care. She was initially put on Bactrim and doxycycline but she did not tolerate this. She subsequently went back to primary care at [facility]". There is a relatively complete note from them dated 12/1. Apparently a culture was done at this time although I do not see that specific result. She was put on oral vancomycin 250 mg 3 times daily the doxycycline and sulfa antibiotic were stopped apparently the doxycycline causing nausea and the sulfa antibiotic would have been "ineffective" is what the patient remembers being told. 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 The patient describes a lot of pain rating 7-8 out of 10 she also has limitation of range of the shoulder although some of this may be chronic. Past medical history type 2 diabetes on insulin, hypertension, psoriasis, history of abscesses 12/23; patient is on clindamycin. Culture I did last time showed anaerobic bacteria often associated with bite injuries but patient denies any such history. She is tolerating clindamycin well with no side effects in particular diarrhea. There is some improvement in the pain in measurements of the wound but still purulent drainage I was a bit  disappointed about this. X-rays of the area were negative. The particular anaerobic bacteria cultured this time was Eikenella Corredens 1/6; not much change in this. Still a deep tunneling wound. There is less purulence but still drainage. There is erythema around the wound but less palpable tenderness. She is just starting the second prescription of clindamycin. I think there was a gap between the 2 prescriptions where she was not taking it 1/13; depth of this at 2.3 cm measured myself. There is no purulent drainage what I can see of the wound bed looks healthy. The sinus tract does not go further than the 2.3 cm I'm able to probe. She is still finishing the clindamycin that I gave her. The erythema around the area and the tenderness is better. This is over the distal part of the deltoid. What really concerns me is the limited range of motion she has in the left shoulder with a lot of pain. She is describes her self as having osteoarthritis. There is significant tenderness along the joint line anteriorly. She does not have range of motion in the abduction and flexion. In fact I wonder whether she has any abduction in the shoulder beyond the first 15 or 20 degrees 1/27; somewhat surprisingly the patient comes in with the probing area in her left deltoid totally epithelialized. The skin goes down into this but spreading apart it is fully epithelialized there is a divot here. Somewhat unusual discoloration of the surrounding skin from this presumed abscess however there is absolutely no tenderness no crepitus. I tried to refer her to orthopedic surgery last time she is still not heard from them [[facility]] we gave her their number to follow-up with them. Objective Constitutional Patient is hypertensive.. Pulse regular and within target range for patient.Marland Kitchen Respirations regular, non-labored and within  target range.. Temperature is normal and within the target range for the patient.Marland Kitchen Appears in no  distress. Vitals Time Taken: 9:31 AM, Height: 62 in, Weight: 215 lbs, BMI: 39.3, Temperature: 97.6 F, Pulse: 77 bpm, Respiratory Rate: 18 breaths/min, Blood Pressure: 145/83 mmHg. General Notes: Wound exam; left deltoid region. A little small divot remains what was left of this deep probing area. All of this is epithelialized. I think the wound is "healed". This was originally an abscess. Surrounding skin still looks darker than her normal scan but there is absolutely no tenderness. Integumentary (Hair, Skin) Wound #1 status is Open. Original cause of wound was Gradually Appeared. The wound is located on the Left Upper Arm. The wound measures 0cm length x 0cm width x 0cm depth; 0cm^2 area and 0cm^3 volume. There is no tunneling or undermining noted. There is a none present amount of drainage noted. The wound margin is distinct with the outline attached to the wound base. There is no granulation within the wound bed. There is no necrotic tissue within the wound bed. 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 Patient to follow up with Maylon Cos related to left shoulder. #1. I think the patient can be discharged from the wound care center #2. I would still like her seen by orthopedics not least of which is because of the very limited range of motion she has in her shoulder she is only 50 years old when she first came here at some concerned about this deep probing area connecting with the left shoulder but it does not seem that following this clinically for several weeks that that is really the concern Electronic Signature(s) Signed: 03/05/2020 4:56:03 PM By: Linton Ham MD Entered By:  Linton Ham on 03/05/2020 10:58:09 -------------------------------------------------------------------------------- SuperBill Details Patient Name: Date of Service: Alexandria Kail. 03/05/2020 Medical Record Number: [id number] Patient Account Number: [id number] Date of Birth/Sex: Treating RN: Feb 21, 1970 (50 y.o. Alexandria Meyer, Alexandria Meyer Primary Care Provider: Carron Curie, GUILFO RD Other Clinician: Referring Provider: Treating Provider/Extender: Alvy Beal RTMENT, GUILFO RD Weeks in Treatment: 5 Diagnosis Coding ICD-10 Codes Code Description L02.818 Cutaneous abscess of other sites L98.498 Non-pressure chronic ulcer of skin of other sites with other specified severity Z86.14 Personal history of Methicillin resistant Staphylococcus aureus infection M25.512 Pain in left shoulder Facility Procedures CPT4 Code: [id number] Description: 99213 - WOUND CARE VISIT-LEV 3 EST PT Modifier: Quantity: 1 Physician Procedures : CPT4 Code Description Modifier [id number] - WC PHYS LEVEL 2 - EST PT ICD-10 Diagnosis Description L98.498 Non-pressure chronic ulcer of skin of other sites with other specified severity L02.818 Cutaneous abscess of other sites Z86.14 Personal  history of Methicillin resistant Staphylococcus aureus infection Quantity: 1 Electronic Signature(s) Signed: 03/05/2020 4:56:03 PM By: Linton Ham MD Entered By: Linton Ham on 03/05/2020 10:58:28

## 2020-03-23 DIAGNOSIS — M25512 Pain in left shoulder: Secondary | ICD-10-CM | POA: Insufficient documentation

## 2020-04-06 ENCOUNTER — Other Ambulatory Visit: Payer: Self-pay | Admitting: Physician Assistant

## 2020-04-06 DIAGNOSIS — M25512 Pain in left shoulder: Secondary | ICD-10-CM

## 2020-04-09 ENCOUNTER — Other Ambulatory Visit: Payer: Self-pay

## 2020-04-09 ENCOUNTER — Ambulatory Visit
Admission: RE | Admit: 2020-04-09 | Discharge: 2020-04-09 | Disposition: A | Discharge status: Home / Self Care | Payer: Medicaid Other | Source: Ambulatory Visit | Attending: Physician Assistant | Admitting: Physician Assistant

## 2020-04-09 DIAGNOSIS — M25512 Pain in left shoulder: Secondary | ICD-10-CM

## 2020-04-14 LAB — ANAEROBIC AND AEROBIC CULTURE
AER RESULT:: NO GROWTH
MICRO NUMBER:: 11603180
MICRO NUMBER:: 11603181
SPECIMEN QUALITY:: ADEQUATE
SPECIMEN QUALITY:: ADEQUATE

## 2020-04-14 LAB — SYNOVIAL FLUID ANALYSIS, COMPLETE
Basophils, %: 0 %
Eosinophils-Synovial: 0 % (ref 0–2)
Lymphocytes-Synovial Fld: 40 % (ref 0–74)
Monocyte/Macrophage: 60 % (ref 0–69)
Neutrophil, Synovial: 0 % (ref 0–24)
Synoviocytes, %: 0 % (ref 0–15)
WBC, Synovial: 207 cells/uL — ABNORMAL HIGH (ref <150)

## 2020-04-21 ENCOUNTER — Encounter (HOSPITAL_COMMUNITY): Payer: Self-pay | Admitting: Emergency Medicine

## 2020-04-21 ENCOUNTER — Emergency Department (HOSPITAL_COMMUNITY)
Admission: EM | Admit: 2020-04-21 | Discharge: 2020-04-22 | Disposition: A | Discharge status: Home / Self Care | Payer: Medicaid Other | Attending: Emergency Medicine | Admitting: Emergency Medicine

## 2020-04-21 ENCOUNTER — Other Ambulatory Visit: Payer: Self-pay

## 2020-04-21 DIAGNOSIS — S161XXA Strain of muscle, fascia and tendon at neck level, initial encounter: Secondary | ICD-10-CM | POA: Diagnosis not present

## 2020-04-21 DIAGNOSIS — Z7984 Long term (current) use of oral hypoglycemic drugs: Secondary | ICD-10-CM | POA: Diagnosis not present

## 2020-04-21 DIAGNOSIS — G4489 Other headache syndrome: Secondary | ICD-10-CM | POA: Insufficient documentation

## 2020-04-21 DIAGNOSIS — W1830XA Fall on same level, unspecified, initial encounter: Secondary | ICD-10-CM | POA: Diagnosis not present

## 2020-04-21 DIAGNOSIS — R202 Paresthesia of skin: Secondary | ICD-10-CM | POA: Diagnosis not present

## 2020-04-21 DIAGNOSIS — M25512 Pain in left shoulder: Secondary | ICD-10-CM | POA: Insufficient documentation

## 2020-04-21 DIAGNOSIS — I10 Essential (primary) hypertension: Secondary | ICD-10-CM | POA: Diagnosis not present

## 2020-04-21 DIAGNOSIS — Z79899 Other long term (current) drug therapy: Secondary | ICD-10-CM | POA: Diagnosis not present

## 2020-04-21 DIAGNOSIS — Z794 Long term (current) use of insulin: Secondary | ICD-10-CM | POA: Diagnosis not present

## 2020-04-21 DIAGNOSIS — S199XXA Unspecified injury of neck, initial encounter: Secondary | ICD-10-CM | POA: Diagnosis present

## 2020-04-21 DIAGNOSIS — E1140 Type 2 diabetes mellitus with diabetic neuropathy, unspecified: Secondary | ICD-10-CM | POA: Insufficient documentation

## 2020-04-21 LAB — I-STAT BETA HCG BLOOD, ED (MC, WL, AP ONLY): I-stat hCG, quantitative: <5 m[IU]/mL (ref <5)

## 2020-04-21 LAB — BASIC METABOLIC PANEL
Anion gap: 7 (ref 5–15)
BUN: 36 mg/dL — ABNORMAL HIGH (ref 6–20)
CO2: 31 mmol/L (ref 22–32)
Calcium: 9.3 mg/dL (ref 8.9–10.3)
Chloride: 103 mmol/L (ref 98–111)
Creatinine, Ser: 1.12 mg/dL — ABNORMAL HIGH (ref 0.44–1.00)
GFR, Estimated: 60 mL/min — ABNORMAL LOW (ref >60)
Glucose, Bld: 125 mg/dL — ABNORMAL HIGH (ref 70–99)
Potassium: 4.5 mmol/L (ref 3.5–5.1)
Sodium: 141 mmol/L (ref 135–145)

## 2020-04-21 LAB — URINALYSIS, ROUTINE W REFLEX MICROSCOPIC
Bilirubin Urine: NEGATIVE
Glucose, UA: NEGATIVE mg/dL
Hgb urine dipstick: NEGATIVE
Ketones, ur: NEGATIVE mg/dL
Leukocytes,Ua: NEGATIVE
Nitrite: NEGATIVE
Protein, ur: 100 mg/dL — AB
Specific Gravity, Urine: 1.011 (ref 1.005–1.030)
pH: 6 (ref 5.0–8.0)

## 2020-04-21 LAB — CBC
HCT: 38.7 % (ref 36.0–46.0)
Hemoglobin: 11.9 g/dL — ABNORMAL LOW (ref 12.0–15.0)
MCH: 26.4 pg (ref 26.0–34.0)
MCHC: 30.7 g/dL (ref 30.0–36.0)
MCV: 86 fL (ref 80.0–100.0)
Platelets: 183 10*3/uL (ref 150–400)
RBC: 4.5 MIL/uL (ref 3.87–5.11)
RDW: 15.3 % (ref 11.5–15.5)
WBC: 6.6 10*3/uL (ref 4.0–10.5)
nRBC: 0 % (ref 0.0–0.2)
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
## 2020-04-22 ENCOUNTER — Emergency Department (HOSPITAL_COMMUNITY): Payer: Medicaid Other

## 2020-04-22 MED ORDER — FENTANYL CITRATE (PF) 100 MCG/2ML IJ SOLN
100.0000 ug | Freq: Once | INTRAMUSCULAR | Status: AC
Start: 2020-04-22 — End: 2020-04-22
  Administered 2020-04-22: 100 ug via INTRAVENOUS
  Filled 2020-04-22: qty 2

## 2020-04-22 MED ORDER — PROCHLORPERAZINE EDISYLATE 10 MG/2ML IJ SOLN
10.0000 mg | Freq: Once | INTRAMUSCULAR | Status: AC
  Administered 2020-04-22: 10 mg via INTRAVENOUS
  Filled 2020-04-22: qty 2

## 2020-04-22 MED ORDER — LORAZEPAM 2 MG/ML IJ SOLN
1.0000 mg | Freq: Once | INTRAMUSCULAR | Status: AC
  Administered 2020-04-22: 1 mg via INTRAVENOUS
  Filled 2020-04-22: qty 1

## 2020-04-22 MED ORDER — DIPHENHYDRAMINE HCL 50 MG/ML IJ SOLN
25.0000 mg | Freq: Once | INTRAMUSCULAR | Status: AC
  Administered 2020-04-22: 25 mg via INTRAVENOUS
  Filled 2020-04-22: qty 1

## 2020-04-22 NOTE — ED Notes (Signed)
Pt given a [place] sandwich and apple juice

## 2020-04-22 NOTE — Discharge Instructions (Addendum)

## 2020-04-22 NOTE — ED Notes (Signed)
Pt able to ambulate in the hallway without difficulty

## 2020-04-22 NOTE — ED Provider Notes (Signed)
[facility] EMERGENCY DEPARTMENT Provider Note   CSN: [id number] Arrival date & time: 04/21/20  2121     History Chief Complaint  Patient presents with  . Neck Pain    Alexandria Meyer is a 50 y.o. female.  The history is provided by the patient.  Weakness Severity:  Moderate Onset quality:  Gradual Duration:  6 days Timing:  Constant Progression:  Worsening Chronicity:  New Relieved by:  None tried Worsened by:  Nothing Associated symptoms: arthralgias, extremity numbness, headaches and stroke symptoms   Associated symptoms: no abdominal pain, no chest pain, no fever, no loss of consciousness, no shortness of breath, no vision change and no vomiting   Patient presents with weakness.  Patient reports she underwent a cortisone shot in her left shoulder on March 10.  Soon after she began having numbness in her left face and weakness in the left arm.  Earlier today she reports standing up and she felt that her left leg gave out and felt weak.  No syncope. No traumatic injury. She also reports left-sided neck pain since the injection.  Patient reports she has dealt with a wound to her left shoulder for several months.  She reports she sustained what is the believed to be a brown recluse spider bite several months ago and has been on multiple rounds of antibiotics.  She underwent a left shoulder arthrocentesis on March 3 and was told her cultures were negative.  On this past March 10 she had an injection of the shoulder by the orthopedist.  She reports continued pain in the left shoulder but this is not a new phenomenon    Past Medical History:  Diagnosis Date  . Diabetes mellitus   . Hypertension   . Neuropathy in diabetes ([place])   . Osteoarthritis   . Scoliosis     There are no problems to display for this patient.   Past Surgical History:  Procedure Laterality Date  . BACK SURGERY  2014  . CESAREAN SECTION    . TUBAL LIGATION       OB History    No obstetric history on file.     Family History  Problem Relation Age of Onset  . Diabetes Mother   . Diabetes Father     Social History   Tobacco Use  . Smoking status: Never Smoker  . Smokeless tobacco: Never Used  Vaping Use  . Vaping Use: Never used  Substance Use Topics  . Alcohol use: No  . Drug use: No    Home Medications Prior to Admission medications   Medication Sig Start Date End Date Taking? Authorizing Provider  atorvastatin (LIPITOR) 40 MG tablet Take 40 mg by mouth daily.    [provider]  canagliflozin (INVOKANA) 100 MG TABS tablet Take 100 mg by mouth daily before breakfast.    [provider]  celecoxib (CELEBREX) 100 MG capsule Take 100 mg by mouth 2 (two) times daily.    [provider]  cyclobenzaprine (FLEXERIL) 10 MG tablet Take 10 mg by mouth 3 (three) times daily as needed for muscle spasms.     [provider]  gabapentin (NEURONTIN) 300 MG capsule Take 600 mg by mouth 3 (three) times daily.     [provider]  HYDROcodone Bitartrate ER (HYSINGLA ER) 30 MG T24A Take 30 mg by mouth at bedtime.    [provider]  HYDROcodone-acetaminophen (NORCO) 10-325 MG tablet Take 1 tablet by mouth 2 (two) times  daily.    [provider]  insulin aspart (NOVOLOG) 100 UNIT/ML injection Inject 2-8 Units into the skin 3 (three) times daily before meals. Sliding scale    [provider]  Insulin Degludec (TRESIBA FLEXTOUCH) 200 UNIT/ML SOPN Inject 80 Units into the skin 2 (two) times daily.    [provider]  lubiprostone (AMITIZA) 24 MCG capsule Take 24 mcg by mouth 2 (two) times daily with a meal.    [provider]  metoprolol tartrate (LOPRESSOR) 50 MG tablet Take 50 mg by mouth 2 (two) times daily.    [provider]  valACYclovir (VALTREX) 500 MG tablet Take 500 mg by mouth daily.    [provider]    Allergies    Exenatide, Lisinopril, Penicillins,  Canagliflozin, and Metformin  Review of Systems   Review of Systems  Constitutional: Negative for fever.  Eyes: Negative for visual disturbance.  Respiratory: Negative for shortness of breath.   Cardiovascular: Negative for chest pain.  Gastrointestinal: Negative for abdominal pain and vomiting.  Musculoskeletal: Positive for arthralgias, joint swelling and neck pain. Negative for back pain.  Neurological: Positive for weakness, numbness and headaches. Negative for loss of consciousness, syncope and speech difficulty.  All other systems reviewed and are negative.   Physical Exam Updated Vital Signs BP (!) 157/96 (BP Location: Right Arm)   Pulse 82   Temp 98.4 F (36.9 C) (Oral)   Resp 18   LMP 06/28/2016   SpO2 99%   Physical Exam CONSTITUTIONAL: Well developed/well nourished HEAD: Normocephalic/atraumatic EYES: EOMI/PERRL, no nystagmus, no ptosis ENMT: Mucous membranes moist NECK: supple no meningeal signs, no bruits CV: S1/S2 noted, no murmurs/rubs/gallops noted LUNGS: Lungs are clear to auscultation bilaterally, no apparent distress ABDOMEN: soft, nontender, no rebound or guarding GU:no cva tenderness NEURO:Awake/alert, face symmetric, left arm drift noted with decreased handgrip, though likely limited due to pain in left shoulder. There is a drift in the left leg. No weakness noted in the right arm or right leg Cranial nerves 3/4/6/08/15/08/11/12 tested and intact Patient reports sensory deficit in her left face and left arm and left leg EXTREMITIES: pulses normal, full ROM Tenderness and edema noted to the left shoulder.  No crepitus.  No drainage.  No significant warmth is noted.  See photo below SKIN: warm, color normal PSYCH: no abnormalities of mood noted   Patient gave verbal permission to utilize photo for medical documentation only The image was not stored on any personal device ED Results / Procedures / Treatments   Labs (all labs ordered are listed, but  only abnormal results are displayed) Labs Reviewed  BASIC METABOLIC PANEL - Abnormal; Notable for the following components:      Result Value   Glucose, Bld 125 (*)    BUN 36 (*)    Creatinine, Ser 1.12 (*)    GFR, Estimated 60 (*)    All other components within normal limits  CBC - Abnormal; Notable for the following components:   Hemoglobin 11.9 (*)    All other components within normal limits  URINALYSIS, ROUTINE W REFLEX MICROSCOPIC - Abnormal; Notable for the following components:   Color, Urine STRAW (*)    Protein, ur 100 (*)    Bacteria, UA RARE (*)    All other components within normal limits  I-STAT BETA HCG BLOOD, ED ([facility], [facility], [facility] ONLY)    EKG EKG Interpretation  Date/Time:  Tuesday April 21 2020 21:29:44 EDT Ventricular Rate:  76 PR Interval:  174  QRS Duration: 104 QT Interval:  394 QTC Calculation: 443 R Axis:   -162 Text Interpretation: Normal sinus rhythm Indeterminate axis Cannot rule out Anterior infarct , age undetermined Abnormal ECG Confirmed by Ripley Fraise [phone]) on 04/21/2020 11:47:07 PM   Radiology MR BRAIN WO CONTRAST  Result Date: 04/22/2020 CLINICAL DATA:  Initial evaluation for left-sided neck pain, left arm pain, left facial tingling. EXAM: MRI HEAD WITHOUT CONTRAST MRI CERVICAL SPINE WITHOUT CONTRAST TECHNIQUE: Multiplanar, multiecho pulse sequences of the brain and surrounding structures, and cervical spine, to include the craniocervical junction and cervicothoracic junction, were obtained without intravenous contrast. COMPARISON:  Prior study from 03/16/2008. FINDINGS: MRI HEAD FINDINGS Brain: Examination moderately degraded by motion artifact. Cerebral volume within normal limits. Few scattered patchy T2/FLAIR hyperintensity noted within the periventricular and deep white matter both cerebral hemispheres, nonspecific, but overall mild for age. Largest of these foci measures 9 mm and is positioned at the posterior right frontal centrum semi ovale,  oriented perpendicular to the lateral ventricle (series 6, image 17). No abnormal foci of restricted diffusion to suggest acute or subacute ischemia. Gray-white matter differentiation maintained. No areas of chronic cortical infarction. No evidence for acute or chronic intracranial hemorrhage. No mass lesion, midline shift or mass effect. No hydrocephalus or extra-axial fluid collection. Pituitary gland and suprasellar region within normal limits. Midline structures intact. Vascular: Major intracranial vascular flow voids are maintained. Skull and upper cervical spine: Craniocervical junction within normal limits. No visible focal marrow replacing lesion. No scalp soft tissue abnormality. Sinuses/Orbits: Patient status post bilateral ocular lens replacement. Globes and orbital soft tissues demonstrate no acute finding. Paranasal sinuses are largely clear. No mastoid effusion. Inner ear structures grossly normal. Other: None. MRI CERVICAL SPINE FINDINGS Alignment: Examination severely degraded by motion artifact. Straightening of the normal cervical lordosis.  No listhesis. Vertebrae: Vertebral body height maintained without acute or chronic fracture. Bone marrow signal intensity grossly within normal limits. No discrete or worrisome osseous lesions. No abnormal marrow edema. Cord: Signal intensity within the cervical spinal cord grossly within normal limits. No convincing cord signal abnormality. Normal cord caliber and morphology. Posterior Fossa, vertebral arteries, paraspinal tissues: Craniocervical junction within normal limits. Paraspinous and prevertebral soft tissues normal. Normal flow voids seen within the vertebral arteries bilaterally. Disc levels: C2-C3: Unremarkable. C3-C4:  Unremarkable. C4-C5: Mild annular disc bulge. No significant spinal stenosis. Foramina appear patent. C5-C6: Mild annular disc bulge with uncovertebral hypertrophy. No significant spinal stenosis. Mild left C6 foraminal narrowing.  Right neural foramen patent. C6-C7:  Unremarkable. C7-T1:  Unremarkable. IMPRESSION: MRI HEAD IMPRESSION: 1. Technically limited exam due to motion artifact. 2. Few scattered subcentimeter T2/FLAIR hyperintensity involving the periventricular and deep white matter both cerebral hemispheres, nonspecific, but overall mild for age. Primary differential considerations include sequelae of chronic microvascular ischemia or possibly demyelination. 3. No other acute intracranial abnormality. MRI CERVICAL SPINE IMPRESSION: 1. Technically limited exam due to extensive motion artifact. 2. Mild noncompressive disc bulging at C4-5 and C5-6 without significant spinal stenosis. Associated mild left C6 foraminal narrowing. 3. Otherwise grossly unremarkable and normal MRI of the cervical spine and spinal cord. Electronically Signed   By: Jeannine Boga M.D.   On: 04/22/2020 04:00   MR Cervical Spine Wo Contrast  Result Date: 04/22/2020 CLINICAL DATA:  Initial evaluation for left-sided neck pain, left arm pain, left facial tingling. EXAM: MRI HEAD WITHOUT CONTRAST MRI CERVICAL SPINE WITHOUT CONTRAST TECHNIQUE: Multiplanar, multiecho pulse sequences of the brain and surrounding structures, and cervical spine, to include  the craniocervical junction and cervicothoracic junction, were obtained without intravenous contrast. COMPARISON:  Prior study from 03/16/2008. FINDINGS: MRI HEAD FINDINGS Brain: Examination moderately degraded by motion artifact. Cerebral volume within normal limits. Few scattered patchy T2/FLAIR hyperintensity noted within the periventricular and deep white matter both cerebral hemispheres, nonspecific, but overall mild for age. Largest of these foci measures 9 mm and is positioned at the posterior right frontal centrum semi ovale, oriented perpendicular to the lateral ventricle (series 6, image 17). No abnormal foci of restricted diffusion to suggest acute or subacute ischemia. Gray-white matter  differentiation maintained. No areas of chronic cortical infarction. No evidence for acute or chronic intracranial hemorrhage. No mass lesion, midline shift or mass effect. No hydrocephalus or extra-axial fluid collection. Pituitary gland and suprasellar region within normal limits. Midline structures intact. Vascular: Major intracranial vascular flow voids are maintained. Skull and upper cervical spine: Craniocervical junction within normal limits. No visible focal marrow replacing lesion. No scalp soft tissue abnormality. Sinuses/Orbits: Patient status post bilateral ocular lens replacement. Globes and orbital soft tissues demonstrate no acute finding. Paranasal sinuses are largely clear. No mastoid effusion. Inner ear structures grossly normal. Other: None. MRI CERVICAL SPINE FINDINGS Alignment: Examination severely degraded by motion artifact. Straightening of the normal cervical lordosis.  No listhesis. Vertebrae: Vertebral body height maintained without acute or chronic fracture. Bone marrow signal intensity grossly within normal limits. No discrete or worrisome osseous lesions. No abnormal marrow edema. Cord: Signal intensity within the cervical spinal cord grossly within normal limits. No convincing cord signal abnormality. Normal cord caliber and morphology. Posterior Fossa, vertebral arteries, paraspinal tissues: Craniocervical junction within normal limits. Paraspinous and prevertebral soft tissues normal. Normal flow voids seen within the vertebral arteries bilaterally. Disc levels: C2-C3: Unremarkable. C3-C4:  Unremarkable. C4-C5: Mild annular disc bulge. No significant spinal stenosis. Foramina appear patent. C5-C6: Mild annular disc bulge with uncovertebral hypertrophy. No significant spinal stenosis. Mild left C6 foraminal narrowing. Right neural foramen patent. C6-C7:  Unremarkable. C7-T1:  Unremarkable. IMPRESSION: MRI HEAD IMPRESSION: 1. Technically limited exam due to motion artifact. 2. Few  scattered subcentimeter T2/FLAIR hyperintensity involving the periventricular and deep white matter both cerebral hemispheres, nonspecific, but overall mild for age. Primary differential considerations include sequelae of chronic microvascular ischemia or possibly demyelination. 3. No other acute intracranial abnormality. MRI CERVICAL SPINE IMPRESSION: 1. Technically limited exam due to extensive motion artifact. 2. Mild noncompressive disc bulging at C4-5 and C5-6 without significant spinal stenosis. Associated mild left C6 foraminal narrowing. 3. Otherwise grossly unremarkable and normal MRI of the cervical spine and spinal cord. Electronically Signed   By: Jeannine Boga M.D.   On: 04/22/2020 04:00    Procedures Procedures   Medications Ordered in ED Medications  diphenhydrAMINE (BENADRYL) injection 25 mg (25 mg Intravenous Given 04/22/20 0029)  prochlorperazine (COMPAZINE) injection 10 mg (10 mg Intravenous Given 04/22/20 0029)  fentaNYL (SUBLIMAZE) injection 100 mcg (100 mcg Intravenous Given 04/22/20 0152)  LORazepam (ATIVAN) injection 1 mg (1 mg Intravenous Given 04/22/20 0302)    ED Course  I have reviewed the triage vital signs and the nursing notes.  Pertinent labs & imaging results that were available during my care of the patient were reviewed by me and considered in my medical decision making (see chart for details).    MDM Rules/Calculators/A&P                          /12:57 AM Patient with complicated history.  She  reports having weakness in the left side of her body since March 10.  Today it worsened when she stood up and fell.  No syncope or traumatic injuries reported Due to new onset of numbness and weakness, will proceed with MRI brain, as well as MRI C-spine since she reports neck pain She denies any new back pain.  As for her left shoulder and the chronic wound, she reports this is actually unchanged.  She reports continued pain with any range of motion of the  shoulder.  Low suspicion for acute septic joint or deep space infection at this time 5:31 AM Patient resting comfortably.  MRI is negative.  Patient has eaten a [place] sandwich.  She was able to ambulate independently. No signs of acute neurologic emergency at this time.  Unclear cause of her paresthesias and weakness, though could be complicated migraine. She can follow-up as an outpatient for her chronic wound/left shoulder pain. Final Clinical Impression(s) / ED Diagnoses Final diagnoses:  Strain of neck muscle, initial encounter  Other headache syndrome  Paresthesia    Rx / DC Orders ED Discharge Orders    None       Ripley Fraise, MD 04/22/20 [phone]

## 2020-04-28 ENCOUNTER — Emergency Department (HOSPITAL_COMMUNITY): Payer: Medicaid Other

## 2020-04-28 ENCOUNTER — Other Ambulatory Visit: Payer: Self-pay

## 2020-04-28 ENCOUNTER — Emergency Department (HOSPITAL_COMMUNITY)
Admission: EM | Admit: 2020-04-28 | Discharge: 2020-04-28 | Disposition: A | Discharge status: Home / Self Care | Payer: Medicaid Other | Attending: Emergency Medicine | Admitting: Emergency Medicine

## 2020-04-28 ENCOUNTER — Encounter (HOSPITAL_COMMUNITY): Payer: Self-pay

## 2020-04-28 DIAGNOSIS — E11649 Type 2 diabetes mellitus with hypoglycemia without coma: Secondary | ICD-10-CM | POA: Insufficient documentation

## 2020-04-28 DIAGNOSIS — R222 Localized swelling, mass and lump, trunk: Secondary | ICD-10-CM | POA: Insufficient documentation

## 2020-04-28 DIAGNOSIS — I10 Essential (primary) hypertension: Secondary | ICD-10-CM | POA: Diagnosis not present

## 2020-04-28 DIAGNOSIS — E162 Hypoglycemia, unspecified: Secondary | ICD-10-CM

## 2020-04-28 DIAGNOSIS — Z79899 Other long term (current) drug therapy: Secondary | ICD-10-CM | POA: Diagnosis not present

## 2020-04-28 DIAGNOSIS — Z794 Long term (current) use of insulin: Secondary | ICD-10-CM | POA: Insufficient documentation

## 2020-04-28 DIAGNOSIS — E65 Localized adiposity: Secondary | ICD-10-CM

## 2020-04-28 LAB — CBC WITH DIFFERENTIAL/PLATELET
Abs Immature Granulocytes: 0.02 10*3/uL (ref 0.00–0.07)
Basophils Absolute: 0 10*3/uL (ref 0.0–0.1)
Basophils Relative: 0 %
Eosinophils Absolute: 0 10*3/uL (ref 0.0–0.5)
Eosinophils Relative: 1 %
HCT: 39.3 % (ref 36.0–46.0)
Hemoglobin: 11.7 g/dL — ABNORMAL LOW (ref 12.0–15.0)
Immature Granulocytes: 0 %
Lymphocytes Relative: 30 %
Lymphs Abs: 2.1 10*3/uL (ref 0.7–4.0)
MCH: 26.1 pg (ref 26.0–34.0)
MCHC: 29.8 g/dL — ABNORMAL LOW (ref 30.0–36.0)
MCV: 87.7 fL (ref 80.0–100.0)
Monocytes Absolute: 0.6 10*3/uL (ref 0.1–1.0)
Monocytes Relative: 8 %
Neutro Abs: 4.3 10*3/uL (ref 1.7–7.7)
Neutrophils Relative %: 61 %
Platelets: 219 10*3/uL (ref 150–400)
RBC: 4.48 MIL/uL (ref 3.87–5.11)
RDW: 15.6 % — ABNORMAL HIGH (ref 11.5–15.5)
WBC: 7.1 10*3/uL (ref 4.0–10.5)
nRBC: 0 % (ref 0.0–0.2)

## 2020-04-28 LAB — BASIC METABOLIC PANEL
Anion gap: 7 (ref 5–15)
BUN: 58 mg/dL — ABNORMAL HIGH (ref 6–20)
CO2: 30 mmol/L (ref 22–32)
Calcium: 9.5 mg/dL (ref 8.9–10.3)
Chloride: 107 mmol/L (ref 98–111)
Creatinine, Ser: 1.5 mg/dL — ABNORMAL HIGH (ref 0.44–1.00)
GFR, Estimated: 42 mL/min — ABNORMAL LOW (ref >60)
Glucose, Bld: 123 mg/dL — ABNORMAL HIGH (ref 70–99)
Potassium: 4.5 mmol/L (ref 3.5–5.1)
Sodium: 144 mmol/L (ref 135–145)

## 2020-04-28 LAB — CBG MONITORING, ED
Glucose-Capillary: 47 mg/dL — ABNORMAL LOW (ref 70–99)
Glucose-Capillary: 89 mg/dL (ref 70–99)

## 2020-04-28 MED ORDER — IOHEXOL 300 MG/ML  SOLN
100.0000 mL | Freq: Once | INTRAMUSCULAR | Status: AC | PRN
  Administered 2020-04-28: 100 mL via INTRAVENOUS

## 2020-04-28 MED ORDER — SODIUM CHLORIDE 0.9 % IV BOLUS
1000.0000 mL | Freq: Once | INTRAVENOUS | Status: AC
  Administered 2020-04-28: 1000 mL via INTRAVENOUS

## 2020-04-28 NOTE — ED Notes (Signed)
Pt has discharge orders. Pt did not wait for AVS and discharge instructions. Pt did not wait for staff to obtain last set of VS. Pt stated she was leaving and did not wait for staff to finish pts care.

## 2020-04-28 NOTE — ED Provider Notes (Signed)
[facility] EMERGENCY DEPARTMENT Provider Note  CSN: [id number] Arrival date & time: 04/28/20 1419    History Chief Complaint  Patient presents with  . Abscess  . Hypoglycemia    HPI  Alexandria Meyer is a 50 y.o. female with history of DM who has had a recent history of abscess on L deltoid with prolonged healing, multiple different courses of Abx and wound care clinic visits. She was advised to come to the ED today for a mass in her posterior neck that she states she first noticed about 10-12 days ago. Swelling has gotten worse since then associated with some discomfort and difficulty finding a comfortable position while in bed. She reports her PCP (Chidester) sent a nurse to do a home visit and was concerned about another abscess and sent her to the ED. The Triage note specifically mentions a spinal epidural abscess, however the patient was in the ED for neck pain just a few days ago (after she noticed the swelling) and had an MRI brain and C-spine which were negative for signs of stroke or infection. She denies any UE weakness.   She was noted to have low blood sugars when EMS came to the house although she was not having symptoms of same. She was given food to eat on arrival, awaiting recheck.    Past Medical History:  Diagnosis Date  . Diabetes mellitus   . Hypertension   . Neuropathy in diabetes ([place])   . Osteoarthritis   . Scoliosis     Past Surgical History:  Procedure Laterality Date  . BACK SURGERY  2014  . CESAREAN SECTION    . TUBAL LIGATION      Family History  Problem Relation Age of Onset  . Diabetes Mother   . Diabetes Father     Social History   Tobacco Use  . Smoking status: Never Smoker  . Smokeless tobacco: Never Used  Vaping Use  . Vaping Use: Never used  Substance Use Topics  . Alcohol use: No  . Drug use: No     Home Medications Prior to Admission medications   Medication Sig Start Date End Date Taking?  Authorizing Provider  atorvastatin (LIPITOR) 40 MG tablet Take 40 mg by mouth daily.    [provider]  canagliflozin (INVOKANA) 100 MG TABS tablet Take 100 mg by mouth daily before breakfast.    [provider]  celecoxib (CELEBREX) 100 MG capsule Take 100 mg by mouth 2 (two) times daily.    [provider]  cyclobenzaprine (FLEXERIL) 10 MG tablet Take 10 mg by mouth 3 (three) times daily as needed for muscle spasms.     [provider]  gabapentin (NEURONTIN) 300 MG capsule Take 600 mg by mouth 3 (three) times daily.     [provider]  HYDROcodone Bitartrate ER (HYSINGLA ER) 30 MG T24A Take 30 mg by mouth at bedtime.    [provider]  HYDROcodone-acetaminophen (NORCO) 10-325 MG tablet Take 1 tablet by mouth 2 (two) times daily.    [provider]  insulin aspart (NOVOLOG) 100 UNIT/ML injection Inject 2-8 Units into the skin 3 (three) times daily before meals. Sliding scale    [provider]  Insulin Degludec (TRESIBA FLEXTOUCH) 200 UNIT/ML SOPN Inject 80 Units into the skin 2 (two) times daily.    [provider]  lubiprostone (AMITIZA) 24 MCG capsule Take 24 mcg by mouth 2 (two) times daily with a meal.  [provider]  metoprolol tartrate (LOPRESSOR) 50 MG tablet Take 50 mg by mouth 2 (two) times daily.    [provider]  valACYclovir (VALTREX) 500 MG tablet Take 500 mg by mouth daily.    [provider]     Allergies    Exenatide, Lisinopril, Penicillins, Canagliflozin, and Metformin   Review of Systems   Review of Systems A comprehensive review of systems was completed and negative except as noted in HPI.    Physical Exam BP (!) 153/81   Pulse 88   Temp 98.1 F (36.7 C) (Oral)   Resp 18   Ht 5\' 2"  (1.575 m)   Wt 99.8 kg   LMP 06/28/2016   SpO2 98%   BMI 40.24 kg/m   Physical Exam Vitals and nursing note reviewed.  Constitutional:      Appearance: Normal  appearance.  HENT:     Head: Normocephalic and atraumatic.     Nose: Nose normal.     Mouth/Throat:     Mouth: Mucous membranes are moist.  Eyes:     Extraocular Movements: Extraocular movements intact.     Conjunctiva/sclera: Conjunctivae normal.  Neck:     Comments: Soft tissue fullness on posterior neck without erythema, induration, or focal fluctuance. No significant tenderness. See photo below Cardiovascular:     Rate and Rhythm: Normal rate.  Pulmonary:     Effort: Pulmonary effort is normal.     Breath sounds: Normal breath sounds.  Abdominal:     General: Abdomen is flat.     Palpations: Abdomen is soft.     Tenderness: There is no abdominal tenderness.  Musculoskeletal:        General: No swelling. Normal range of motion.     Cervical back: Neck supple.  Skin:    General: Skin is warm and dry.  Neurological:     General: No focal deficit present.     Mental Status: She is alert.  Psychiatric:        Mood and Affect: Mood normal.        ED Results / Procedures / Treatments   Labs (all labs ordered are listed, but only abnormal results are displayed) Labs Reviewed  BASIC METABOLIC PANEL - Abnormal; Notable for the following components:      Result Value   Glucose, Bld 123 (*)    BUN 58 (*)    Creatinine, Ser 1.50 (*)    GFR, Estimated 42 (*)    All other components within normal limits  CBC WITH DIFFERENTIAL/PLATELET - Abnormal; Notable for the following components:   Hemoglobin 11.7 (*)    MCHC 29.8 (*)    RDW 15.6 (*)    All other components within normal limits  CBG MONITORING, ED - Abnormal; Notable for the following components:   Glucose-Capillary 47 (*)    All other components within normal limits  CBG MONITORING, ED    EKG None  Radiology CT Soft Tissue Neck W Contrast  Result Date: 04/28/2020 CLINICAL DATA:  Neck mass; technologist note states pain and swelling to back of neck EXAM: CT NECK WITH CONTRAST TECHNIQUE: Multidetector CT  imaging of the neck was performed using the standard protocol following the bolus administration of intravenous contrast. CONTRAST:  154mL OMNIPAQUE IOHEXOL 300 MG/ML  SOLN COMPARISON:  None. FINDINGS: Pharynx and larynx: Unremarkable.  No mass or swelling. Salivary glands: Unremarkable. Thyroid: Normal. Lymph nodes: No enlarged lymph nodes. Vascular: Major neck vessels are patent. Limited intracranial: No abnormal  enhancement. Visualized orbits: Unremarkable. Mastoids and visualized paranasal sinuses: Aerated. Skeleton: No significant abnormality Upper chest: No apical lung mass. Other: None. IMPRESSION: No neck mass or adenopathy. Electronically Signed   By: Macy Mis M.D.   On: 04/28/2020 19:17    Procedures Procedures  Medications Ordered in the ED Medications  sodium chloride 0.9 % bolus 1,000 mL (0 mLs Intravenous Stopped 04/28/20 2023)  iohexol (OMNIPAQUE) 300 MG/ML solution 100 mL (100 mLs Intravenous Contrast Given 04/28/20 1754)     MDM Rules/Calculators/A&P MDM Patient with posterior neck swelling, no clinical signs of abscess. No symptoms concerning for epidural abscess. This fullness appears to be larger than on recent MRI imaging from last week. Will check CT to evaluate cause. CBC and BMP in the meantime.   ED Course  I have reviewed the triage vital signs and the nursing notes.  Pertinent labs & imaging results that were available during my care of the patient were reviewed by me and considered in my medical decision making (see chart for details).  Clinical Course as of 04/28/20 2235  Tue Apr 28, 2020  1640 CBC is unremarkable.  [CS]  8850 BMP with mild worsening in Cr from baseline. May account for her hypoglycemia. Glucose on BMP is not low. Will give a liter of saline pending CT.  [CS]  2013 CBG remains normal range. Patient informed of negative CT, her swelling is most consistent with a 'buffalo hump', unclear on why it is bothering her more now, did not seem to be  quite so large on recent MRI. But regardless, not in need of further ED workup. Advised to follow up with PCP. Monitor glucose at home and discuss medication adjustments if continues to run low from time to time.  [CS]    Clinical Course User Index [CS] Truddie Hidden, MD    Final Clinical Impression(s) / ED Diagnoses Final diagnoses:  Buffalo hump  Hypoglycemia    Rx / DC Orders ED Discharge Orders    None       Truddie Hidden, MD 04/28/20 2235

## 2020-04-28 NOTE — ED Triage Notes (Addendum)
Per EMS- Patient was at [facility] today. patienat has an 8 x 4 abscess to the posterior neck. Patient reported that she got bit by a brown recluse in November 2021.  Patient was sent to the ED to r/o a spinal epidural abscess.  CBG-64. EMS gave glucose and CBG increased to 68.  CBG in triage-47. Patient given a sandwich, coke, and cheese. Will recheck after eaten.

## 2020-05-01 DIAGNOSIS — Z62819 Personal history of unspecified abuse in childhood: Secondary | ICD-10-CM | POA: Insufficient documentation

## 2020-05-01 DIAGNOSIS — Z87898 Personal history of other specified conditions: Secondary | ICD-10-CM | POA: Insufficient documentation

## 2020-05-01 DIAGNOSIS — F4310 Post-traumatic stress disorder, unspecified: Secondary | ICD-10-CM | POA: Insufficient documentation

## 2020-05-04 DIAGNOSIS — Z9189 Other specified personal risk factors, not elsewhere classified: Secondary | ICD-10-CM | POA: Insufficient documentation

## 2020-05-04 DIAGNOSIS — M542 Cervicalgia: Secondary | ICD-10-CM | POA: Insufficient documentation

## 2020-05-04 DIAGNOSIS — N888 Other specified noninflammatory disorders of cervix uteri: Secondary | ICD-10-CM | POA: Insufficient documentation

## 2020-05-19 DIAGNOSIS — R7989 Other specified abnormal findings of blood chemistry: Secondary | ICD-10-CM | POA: Insufficient documentation

## 2020-05-19 DIAGNOSIS — E65 Localized adiposity: Secondary | ICD-10-CM | POA: Insufficient documentation

## 2020-05-31 DIAGNOSIS — N183 Chronic kidney disease, stage 3 unspecified: Secondary | ICD-10-CM | POA: Insufficient documentation

## 2020-06-05 ENCOUNTER — Encounter (HOSPITAL_BASED_OUTPATIENT_CLINIC_OR_DEPARTMENT_OTHER): Payer: Medicaid Other | Admitting: Internal Medicine

## 2020-07-10 ENCOUNTER — Other Ambulatory Visit: Payer: Self-pay | Admitting: Nephrology

## 2020-07-10 DIAGNOSIS — N1831 Chronic kidney disease, stage 3a: Secondary | ICD-10-CM

## 2020-07-18 ENCOUNTER — Encounter (HOSPITAL_COMMUNITY): Payer: Self-pay | Admitting: Emergency Medicine

## 2020-07-18 ENCOUNTER — Encounter (HOSPITAL_COMMUNITY): Payer: Self-pay

## 2020-07-18 ENCOUNTER — Ambulatory Visit (HOSPITAL_COMMUNITY)
Admission: EM | Admit: 2020-07-18 | Discharge: 2020-07-18 | Disposition: A | Discharge status: Home / Self Care | Payer: Medicaid Other

## 2020-07-18 ENCOUNTER — Emergency Department (HOSPITAL_COMMUNITY)
Admission: EM | Admit: 2020-07-18 | Discharge: 2020-07-19 | Disposition: A | Discharge status: Home / Self Care | Payer: Medicaid Other | Attending: Emergency Medicine | Admitting: Emergency Medicine

## 2020-07-18 ENCOUNTER — Other Ambulatory Visit: Payer: Self-pay

## 2020-07-18 DIAGNOSIS — R509 Fever, unspecified: Secondary | ICD-10-CM | POA: Diagnosis not present

## 2020-07-18 DIAGNOSIS — I10 Essential (primary) hypertension: Secondary | ICD-10-CM | POA: Diagnosis not present

## 2020-07-18 DIAGNOSIS — M25512 Pain in left shoulder: Secondary | ICD-10-CM

## 2020-07-18 DIAGNOSIS — E1140 Type 2 diabetes mellitus with diabetic neuropathy, unspecified: Secondary | ICD-10-CM | POA: Diagnosis not present

## 2020-07-18 NOTE — ED Provider Notes (Signed)
Emergency Medicine Provider Triage Evaluation Note  Alexandria Meyer , a 50 y.o. female  was evaluated in triage.  Pt complains of shoulder pain.  Has had shoulder pain since November after she was bit by a spider. Has been seen by orthopedics multiple times and got steroid injections.  No fever, chills, redness or warmth.  No numbness or tingling.  No chest pain, shortness of breath.  Review of Systems  Positive: Left shoulder pain Negative: Chest pain, shortness of breath, neck pain, paresthesia  Physical Exam  BP (!) 146/93 (BP Location: Right Arm)   Pulse 73   Temp 98.4 F (36.9 C) (Oral)   Resp 16   Ht 5\' 2"  (1.575 m)   Wt 96.6 kg   LMP 06/28/2016   SpO2 97%   BMI 38.96 kg/m  Gen:   Awake, no distress   Resp:  Normal effort  MSK:   Patient would not let me examine her left arm.  This is in a sling.  No obvious redness or warmth Other:    Medical Decision Making  Medically screening exam initiated at 6:18 PM.  Appropriate orders placed.  Alexandria Meyer was informed that the remainder of the evaluation will be completed by another provider, this initial triage assessment does not replace that evaluation, and the importance of remaining in the ED until their evaluation is complete.  Shoulder pain   Bette Brienza A, PA-C 07/18/20 [name], MD 07/18/20 1940

## 2020-07-18 NOTE — ED Provider Notes (Signed)
[facility]    CSN: [id number] Arrival date & time: 07/18/20  1646      History   Chief Complaint Chief Complaint  Patient presents with   Shoulder Pain    HPI Alexandria Meyer is a 50 y.o. female.   HPI  Shoulder Pain: Patient states that she has had severe left shoulder pain since this morning.  She states that she has had chronic shoulder pain in this area since she was bitten by what appeared to be a brown recluse spider back in November.  She states that her pain has never been this bad.  Pain is beyond the 10 out of 10 in nature.  She is unable to move the shoulder at all.  There is no known injury.  She does have a low-grade fever and chills.  She reports a sharp pain that runs down her shoulder. She has tried warm and cold compresses for symptoms without improvement.   Past Medical History:  Diagnosis Date   Diabetes mellitus    Hypertension    Neuropathy in diabetes [facility])    Osteoarthritis    Scoliosis     There are no problems to display for this patient.   Past Surgical History:  Procedure Laterality Date   BACK SURGERY  2014   CESAREAN SECTION     TUBAL LIGATION      OB History   No obstetric history on file.      Home Medications    Prior to Admission medications   Medication Sig Start Date End Date Taking? Authorizing Provider  dapagliflozin propanediol (FARXIGA) 10 MG TABS tablet  05/19/20  Yes [provider]  atorvastatin (LIPITOR) 40 MG tablet Take 40 mg by mouth daily.    [provider]  canagliflozin (INVOKANA) 100 MG TABS tablet Take 100 mg by mouth daily before breakfast.    [provider]  celecoxib (CELEBREX) 100 MG capsule Take 100 mg by mouth 2 (two) times daily.    [provider]  cyclobenzaprine (FLEXERIL) 10 MG tablet Take 10 mg by mouth 3 (three) times daily as needed for muscle spasms.     [provider]  gabapentin (NEURONTIN) 300 MG capsule Take 600 mg by mouth 3  (three) times daily.     [provider]  HYDROcodone Bitartrate ER (HYSINGLA ER) 30 MG T24A Take 30 mg by mouth at bedtime.    [provider]  HYDROcodone-acetaminophen (NORCO) 10-325 MG tablet Take 1 tablet by mouth 2 (two) times daily.    [provider]  insulin aspart (NOVOLOG) 100 UNIT/ML injection Inject 2-8 Units into the skin 3 (three) times daily before meals. Sliding scale    [provider]  Insulin Degludec (TRESIBA FLEXTOUCH) 200 UNIT/ML SOPN Inject 80 Units into the skin 2 (two) times daily.    [provider]  lubiprostone (AMITIZA) 24 MCG capsule Take 24 mcg by mouth 2 (two) times daily with a meal.    [provider]  metoprolol tartrate (LOPRESSOR) 50 MG tablet Take 50 mg by mouth 2 (two) times daily.    [provider]  valACYclovir (VALTREX) 500 MG tablet Take 500 mg by mouth daily.    [provider]    Family History Family History  Problem Relation Age of Onset   Diabetes Mother    Diabetes Father     Social History Social History   Tobacco Use   Smoking status: Never   Smokeless tobacco: Never  Vaping Use   Vaping Use: Never used  Substance Use Topics   Alcohol use: No   Drug use: No     Allergies   Exenatide, Lisinopril, Penicillins, Canagliflozin, and Metformin   Review of Systems Review of Systems  As stated above in HPI Physical Exam Triage Vital Signs ED Triage Vitals  Enc Vitals Group     BP 07/18/20 1717 (!) 130/52     Pulse Rate 07/18/20 1717 72     Resp 07/18/20 1717 20     Temp 07/18/20 1717 99.2 F (37.3 C)     Temp Source 07/18/20 1717 Oral     SpO2 07/18/20 1717 97 %     Weight --      Height --      Head Circumference --      Peak Flow --      Pain Score 07/18/20 1714 10     Pain Loc --      Pain Edu? --      Excl. in [facility]? --    No data found.  Updated Vital Signs BP (!) 130/52 (BP Location: Right Arm)   Pulse 72   Temp 99.2 F (37.3 C) (Oral)    Resp 20   LMP 06/28/2016   SpO2 97%   Physical Exam Vitals and nursing note reviewed.  Constitutional:      General: She is in acute distress.     Appearance: Normal appearance. She is obese. She is ill-appearing.  HENT:     Head: Normocephalic and atraumatic.  Cardiovascular:     Pulses: Normal pulses.  Musculoskeletal:        General: Swelling (left shoulder) and tenderness (significant tenderness of entires shoulder and left arm to palpation) present.     Cervical back: Normal range of motion and neck supple. No rigidity or tenderness.  Skin:    General: Skin is warm.     Capillary Refill: Capillary refill takes less than 2 seconds.     Coloration: Skin is not pale.     Comments: Patient has multiple scabs that appear healing all over the skin including the area over the shoulder  Neurological:     Mental Status: She is alert and oriented to person, place, and time.     Sensory: No sensory deficit.     Motor: Weakness (from pain) present.     UC Treatments / Results  Labs (all labs ordered are listed, but only abnormal results are displayed) Labs Reviewed - No data to display  EKG   Radiology No results found.  Procedures Procedures (including critical care time)  Medications Ordered in UC Medications - No data to display  Initial Impression / Assessment and Plan / UC Course  I have reviewed the triage vital signs and the nursing notes.  Pertinent labs & imaging results that were available during my care of the patient were reviewed by me and considered in my medical decision making (see chart for details).     New.  Patient's pain is greater than I would expect without an injury.  Given her low-grade fever and elevated blood pressure I am concerned about a potential septic joint versus thoracic outlet syndrome.  For this reason I am going to send her to the emergency room for further work-up and imaging.  Patient is more than agreeable. She prefers to walk  to ER.  Final Clinical Impressions(s) / UC Diagnoses   Final diagnoses:  None   Discharge Instructions  None    ED Prescriptions   None    PDMP not reviewed this encounter.   Hughie Closs, [place] 07/18/20 1743

## 2020-07-18 NOTE — ED Triage Notes (Signed)
Pt in with c/o left shoulder pain that radiates down her arm  Pt states she cannot lift her arm up. Pt also states she has been dealing with this issue since she was bitten by brown recluse in November

## 2020-07-18 NOTE — ED Triage Notes (Signed)
Pt states she is unable to use her left arm and shoulder since a brown recluse bite in November. Pt has sling on arm.

## 2020-07-19 ENCOUNTER — Emergency Department (HOSPITAL_COMMUNITY): Payer: Medicaid Other

## 2020-07-19 LAB — BASIC METABOLIC PANEL
Anion gap: 8 (ref 5–15)
BUN: 35 mg/dL — ABNORMAL HIGH (ref 6–20)
CO2: 32 mmol/L (ref 22–32)
Calcium: 9.9 mg/dL (ref 8.9–10.3)
Chloride: 100 mmol/L (ref 98–111)
Creatinine, Ser: 1.28 mg/dL — ABNORMAL HIGH (ref 0.44–1.00)
GFR, Estimated: 51 mL/min — ABNORMAL LOW (ref >60)
Glucose, Bld: 213 mg/dL — ABNORMAL HIGH (ref 70–99)
Potassium: 4 mmol/L (ref 3.5–5.1)
Sodium: 140 mmol/L (ref 135–145)

## 2020-07-19 LAB — CBC WITH DIFFERENTIAL/PLATELET
Abs Immature Granulocytes: 0.04 10*3/uL (ref 0.00–0.07)
Basophils Absolute: 0 10*3/uL (ref 0.0–0.1)
Basophils Relative: 0 %
Eosinophils Absolute: 0.1 10*3/uL (ref 0.0–0.5)
Eosinophils Relative: 1 %
HCT: 44.9 % (ref 36.0–46.0)
Hemoglobin: 13.5 g/dL (ref 12.0–15.0)
Immature Granulocytes: 1 %
Lymphocytes Relative: 41 %
Lymphs Abs: 3.3 10*3/uL (ref 0.7–4.0)
MCH: 26.3 pg (ref 26.0–34.0)
MCHC: 30.1 g/dL (ref 30.0–36.0)
MCV: 87.5 fL (ref 80.0–100.0)
Monocytes Absolute: 0.7 10*3/uL (ref 0.1–1.0)
Monocytes Relative: 8 %
Neutro Abs: 4 10*3/uL (ref 1.7–7.7)
Neutrophils Relative %: 49 %
Platelets: 251 10*3/uL (ref 150–400)
RBC: 5.13 MIL/uL — ABNORMAL HIGH (ref 3.87–5.11)
RDW: 14.6 % (ref 11.5–15.5)
WBC: 8 10*3/uL (ref 4.0–10.5)
nRBC: 0 % (ref 0.0–0.2)

## 2020-07-19 NOTE — ED Provider Notes (Signed)
Emergency Department Provider Note   I have reviewed the triage vital signs and the nursing notes.   HISTORY  Chief Complaint Shoulder Pain   HPI Alexandria Meyer is a 50 y.o. female with chronic left shoulder pain presents to the ED with worsening shoulder pain suddenly. No injury. Follows with ortho as an outpatient for PT and injections. No fever or chills. Went to UC and was directed to the ED with pain out of proportion and concern for septic joint. Pain radiates down the left arm at times. No CP. No SOB.     Past Medical History:  Diagnosis Date   Diabetes mellitus    Hypertension    Neuropathy in diabetes [facility])    Osteoarthritis    Scoliosis     There are no problems to display for this patient.   Past Surgical History:  Procedure Laterality Date   BACK SURGERY  2014   CESAREAN SECTION     TUBAL LIGATION      Allergies Exenatide, Lisinopril, Penicillins, Canagliflozin, and Metformin  Family History  Problem Relation Age of Onset   Diabetes Mother    Diabetes Father     Social History Social History   Tobacco Use   Smoking status: Never   Smokeless tobacco: Never  Vaping Use   Vaping Use: Never used  Substance Use Topics   Alcohol use: No   Drug use: No    Review of Systems  Constitutional: No fever/chills Eyes: No visual changes. ENT: No sore throat. Cardiovascular: Denies chest pain. Respiratory: Denies shortness of breath. Gastrointestinal: No abdominal pain.  No nausea, no vomiting.  No diarrhea.  No constipation. Genitourinary: Negative for dysuria. Musculoskeletal: Negative for back pain. Positive left shoulder pain.  Skin: Negative for rash. Neurological: Negative for headaches, focal weakness or numbness.  10-point ROS otherwise negative.  ____________________________________________   PHYSICAL EXAM:  VITAL SIGNS: ED Triage Vitals  Enc Vitals Group     BP 07/18/20 1801 (!) 146/93     Pulse Rate 07/18/20 1801 73      Resp 07/18/20 1801 16     Temp 07/18/20 1801 98.4 F (36.9 C)     Temp Source 07/18/20 1801 Oral     SpO2 07/18/20 1801 97 %     Weight 07/18/20 1800 213 lb (96.6 kg)     Height 07/18/20 1800 5\' 2"  (1.575 m)   Constitutional: Alert and oriented. Well appearing and in no acute distress. Eyes: Conjunctivae are normal.  Head: Atraumatic. Nose: No congestion/rhinnorhea. Mouth/Throat: Mucous membranes are moist.  Neck: No stridor.  Cardiovascular: Normal rate, regular rhythm. Good peripheral circulation. Grossly normal heart sounds.   Respiratory: Normal respiratory effort.  No retractions. Lungs CTAB. Gastrointestinal: Soft and nontender. No distention.  Musculoskeletal: Pain with ROM of the shoulder but no limitation in ROM. No joint redness/swelling. Some mid-humeral swelling, cellulitis, or drainage.  Neurologic:  Normal speech and language. No gross focal neurologic deficits are appreciated.  Skin:  Skin is warm, dry and intact. No rash noted.  ____________________________________________   LABS (all labs ordered are listed, but only abnormal results are displayed)  Labs Reviewed  CBC WITH DIFFERENTIAL/PLATELET - Abnormal; Notable for the following components:      Result Value   RBC 5.13 (*)    All other components within normal limits  BASIC METABOLIC PANEL - Abnormal; Notable for the following components:   Glucose, Bld 213 (*)    BUN 35 (*)    Creatinine, Ser  1.28 (*)    GFR, Estimated 51 (*)    All other components within normal limits  _____________________________________  RADIOLOGY  Plain films reviewed.   ____________________________________________   PROCEDURES  Procedure(s) performed:   Procedures  None ____________________________________________   INITIAL IMPRESSION / ASSESSMENT AND PLAN / ED COURSE  Pertinent labs & imaging results that were available during my care of the patient were reviewed by me and considered in my medical decision making  (see chart for details).   Patient presents to the ED with acute on chronic left shoulder pain. Seems chronic in nature here in the ED. The joint is not redness, warm, or swollen. Plain films are baseline for patient. No fever, subjectively or objectively, here or at home. Labs obtained with no leukocytosis. Do not feel that joint fluid analysis required at this time. Will continue to follow with ortho as an outpatient. Discussed ED return precautions.    ____________________________________________  FINAL CLINICAL IMPRESSION(S) / ED DIAGNOSES  Final diagnoses:  Acute pain of left shoulder     Note:  This document was prepared using Dragon voice recognition software and may include unintentional dictation errors.  Nanda Quinton, MD, [facility] Emergency Medicine    Foye Haggart, [facility], MD 07/20/20 1726

## 2020-07-19 NOTE — ED Notes (Signed)
Patient verbalizes understanding of discharge instructions. Opportunity for questioning and answers were provided. Armband removed by staff, pt discharged from ED ambulatory.   

## 2020-07-19 NOTE — Discharge Instructions (Addendum)
Your shoulder x-ray today did not show evidence of new bony abnormality.  Your lab work is not consistent with infection in the joint.  Please call your orthopedic team on Monday and continue taking her home pain medications.

## 2020-07-29 ENCOUNTER — Other Ambulatory Visit: Payer: Medicaid Other

## 2020-08-03 ENCOUNTER — Other Ambulatory Visit: Payer: Self-pay

## 2020-08-03 ENCOUNTER — Encounter: Payer: Self-pay | Admitting: Internal Medicine

## 2020-08-03 ENCOUNTER — Ambulatory Visit: Payer: Medicaid Other | Admitting: Internal Medicine

## 2020-08-03 VITALS — BP 126/88 | HR 88 | Ht 62.0 in | Wt 216.0 lb

## 2020-08-03 DIAGNOSIS — R7989 Other specified abnormal findings of blood chemistry: Secondary | ICD-10-CM | POA: Diagnosis not present

## 2020-08-03 DIAGNOSIS — E1165 Type 2 diabetes mellitus with hyperglycemia: Secondary | ICD-10-CM | POA: Diagnosis not present

## 2020-08-03 DIAGNOSIS — Z794 Long term (current) use of insulin: Secondary | ICD-10-CM

## 2020-08-03 LAB — BASIC METABOLIC PANEL
BUN: 44 mg/dL — ABNORMAL HIGH (ref 6–23)
CO2: 33 mEq/L — ABNORMAL HIGH (ref 19–32)
Calcium: 9.8 mg/dL (ref 8.4–10.5)
Chloride: 103 mEq/L (ref 96–112)
Creatinine, Ser: 1.32 mg/dL — ABNORMAL HIGH (ref 0.40–1.20)
GFR: 46.84 mL/min — ABNORMAL LOW (ref >60.00)
Glucose, Bld: 73 mg/dL (ref 70–99)
Potassium: 3.8 mEq/L (ref 3.5–5.1)
Sodium: 143 mEq/L (ref 135–145)

## 2020-08-03 LAB — POCT GLYCOSYLATED HEMOGLOBIN (HGB A1C): Hemoglobin A1C: 8.4 % — AB (ref 4.0–5.6)

## 2020-08-03 LAB — CORTISOL: Cortisol, Plasma: 10.3 ug/dL

## 2020-08-03 LAB — TSH: TSH: 2.46 u[IU]/mL (ref 0.35–4.50)

## 2020-08-03 MED ORDER — NOVOLOG FLEXPEN 100 UNIT/ML ~~LOC~~ SOPN: PEN_INJECTOR | SUBCUTANEOUS | 11 refills | Status: DC

## 2020-08-03 MED ORDER — BD PEN NEEDLE MINI U/F 31G X 5 MM MISC: 1.0000 | Freq: Four times a day (QID) | 3 refills | Status: AC

## 2020-08-03 MED ORDER — LANTUS SOLOSTAR 100 UNIT/ML ~~LOC~~ SOPN: 66.0000 [IU] | PEN_INJECTOR | Freq: Two times a day (BID) | SUBCUTANEOUS | 11 refills | Status: DC

## 2020-08-03 MED ORDER — DAPAGLIFLOZIN PROPANEDIOL 10 MG PO TABS: 10.0000 mg | ORAL_TABLET | Freq: Every day | ORAL | 3 refills | Status: DC

## 2020-08-03 NOTE — Patient Instructions (Addendum)
-   Continue Farxiga 10 mg daily  - Decrease Lantus to 66 units TWICE daily  - Continue Novolog  20 units with each meal  Novolog correctional insulin: ADD extra units on insulin to your meal-time Novolg dose if your blood sugars are higher than 155. Use the scale below to help guide you:   Blood sugar before meal Number of units to inject  Less than 150 0 unit  151 -  170 1 units  171 -  190 2 units  191 -  210 3 units  211 -  230 4 units  231 -  250 5 units  251 -  270 6 units  271 -  290 7 units  291 -  310 8 units  311 - 330 9 units   HOW TO TREAT LOW BLOOD SUGARS (Blood sugar LESS THAN 70 MG/DL) Please follow the RULE OF 15 for the treatment of hypoglycemia treatment (when your (blood sugars are less than 70 mg/dL)   STEP 1: Take 15 grams of carbohydrates when your blood sugar is low, which includes:  3-4 GLUCOSE TABS  OR 3-4 OZ OF JUICE OR REGULAR SODA OR ONE TUBE OF GLUCOSE GEL    STEP 2: RECHECK blood sugar in 15 MINUTES STEP 3: If your blood sugar is still low at the 15 minute recheck --> then, go back to STEP 1 and treat AGAIN with another 15 grams of carbohydrates.   24-Hour Urine Collection  You will be collecting your urine for a 24-hour period of time. Your timer starts with your first urine of the morning (For example - If you first pee at [place], your timer will start at [place]) [place] away your first urine of the morning Collect your urine every time you pee for the next 24 hours STOP your urine collection 24 hours after you started the collection (For example - You would stop at 9AM the day after you started)

## 2020-08-03 NOTE — Progress Notes (Signed)
Name: Alexandria Meyer  MRN/ DOB: [id number], [birth year]   Age/ Sex: 50 y.o., female    PCP: [facility]   Reason for Endocrinology Evaluation: Type 2 Diabetes Mellitus     Date of Initial Endocrinology Visit: 08/03/2020     PATIENT IDENTIFIER: [name] is a 50 y.o. female with a past medical history of T2DM and HN with hx of Pancreatitis ( while living in [place] ) . The patient presented for initial endocrinology clinic visit on 08/03/2020 for consultative assistance with her diabetes management.    HPI: Alexandria Meyer was    Diagnosed with DM at age 89 started with gestational diabetes  Prior Medications tried/Intolerance: was on Trulicity in 6294 , intolerant to metformin Currently checking blood sugars through the CGM but does not have it on today   Hypoglycemia episodes : yes               Symptoms: yes - morning                  Frequency: 3/ month  Hemoglobin A1c has ranged from 9.1% in 04/2020, peaking at 13.5% in 2018. Patient required assistance for hypoglycemia: no  Patient has required hospitalization within the last 1 year from hyper or hypoglycemia: no  In terms of diet, the patient eats 2-3 meals day, snacks 1-2x a day. Avoids sugar-sweetened beverages     Cushingoid Features: She has been noted with buffalo hump and elevated urinary cortisol at 59.4 mcg/24 in 05/2020 Salivary cortisol came back abnormal at 0.21 mcg/dL at 11 pm ( 0-0.09) but the second sample was normal at 0.05 mcg/dL   Per pt has fluctuating weight  Has HTN , and well controlled  Denies osteoporosis or bone fractures Does not bruise easily   Has hx of back sx, with recurrent back pain She is not on any recent intra-articular injections , last dose was 3 yrs ago     HOME DIABETES REGIMEN: Farxiga 10 mg daily  Lantus 80 units TWIce daily  Novolog 20 units twice daily      Statin: yes ACE-I/ARB: yes Prior Diabetic Education: no    METER DOWNLOAD  SUMMARY:    DIABETIC COMPLICATIONS: Microvascular complications:  Neuropathy Denies: CKD, retinopathy  Last eye exam: Completed 2022  Macrovascular complications:   Denies: CAD, PVD, CVA   PAST HISTORY: Past Medical History:  Past Medical History:  Diagnosis Date   Diabetes mellitus    Hypertension    Neuropathy in diabetes ([place])    Osteoarthritis    Scoliosis    Past Surgical History:  Past Surgical History:  Procedure Laterality Date   BACK SURGERY  2014   CESAREAN SECTION     TUBAL LIGATION      Social History:  reports that she has never smoked. She has never used smokeless tobacco. She reports that she does not drink alcohol and does not use drugs. Family History:  Family History  Problem Relation Age of Onset   Diabetes Mother    Diabetes Father      HOME MEDICATIONS: Allergies as of 08/03/2020       Reactions   Exenatide Nausea And Vomiting, Other (See Comments)   Pancreatitis history   Lisinopril Other (See Comments)   Cough, Pancreatitis   Penicillins Itching, Rash, Hives   Has patient had a PCN reaction causing immediate rash, facial/tongue/throat swelling, SOB or lightheadedness with hypotension: Yes Has patient had a PCN reaction causing severe  rash involving mucus membranes or skin necrosis: Yes Has patient had a PCN reaction that required hospitalization: Yes Has patient had a PCN reaction occurring within the last 10 years: No If all of the above answers are "NO", then may proceed with Cephalosporin use. Has patient had a PCN reaction causing immediate rash, facial/tongue/throat swelling, SOB or lightheadedness with hypotension: Yes Has patient had a PCN reaction causing severe rash involving mucus membranes or skin necrosis: Yes Has patient had a PCN reaction that required hospitalization: Yes Has patient had a PCN reaction occurring within the last 10 years: No If all of the above answers are "NO", then may proceed with Cephalosporin  use. itching   Canagliflozin Other (See Comments)   Yeast infections   Metformin Diarrhea, Nausea And Vomiting   Nausea and vomiting-caused  hospitalization        Medication List        Accurate as of August 03, 2020 12:43 PM. If you have any questions, ask your nurse or doctor.          acyclovir 800 MG tablet Commonly known as: ZOVIRAX Take 800 mg by mouth 5 (five) times daily.   albuterol 108 (90 Base) MCG/ACT inhaler Commonly known as: VENTOLIN HFA Inhale 2 puffs into the lungs every 6 (six) hours as needed for wheezing.   ALLERGY MED PO Take 1 tablet by mouth daily as needed (allergies).   B-D UF III MINI PEN NEEDLES 31G X 5 MM Misc Generic drug: Insulin Pen Needle 1 Device by Does not apply route in the morning, at noon, in the evening, and at bedtime. Started by: Dorita Sciara, MD   dapagliflozin propanediol 10 MG Tabs tablet Commonly known as: FARXIGA Take 1 tablet (10 mg total) by mouth daily.   Dexcom G6 Sensor Misc SMARTSIG:Topical Every 10 Days   Dexcom G6 Transmitter Misc USE TO MONITOR GLUCOSE   diclofenac Sodium 1 % Gel Commonly known as: VOLTAREN Apply 2-4 g topically 3 (three) times daily.   gabapentin 300 MG capsule Commonly known as: NEURONTIN Take 600 mg by mouth 3 (three) times daily.   HYDROcodone-acetaminophen 10-325 MG tablet Commonly known as: NORCO Take 1 tablet by mouth every 6 (six) hours as needed for moderate pain.   Lantus SoloStar 100 UNIT/ML Solostar Pen Generic drug: insulin glargine Inject 66 Units into the skin in the morning and at bedtime. What changed: how much to take Changed by: Dorita Sciara, MD   losartan 100 MG tablet Commonly known as: COZAAR Take 1 tablet by mouth daily.   methocarbamol 750 MG tablet Commonly known as: ROBAXIN Take 750 mg by mouth 3 (three) times daily as needed for muscle spasms.   metoprolol succinate 50 MG 24 hr tablet Commonly known as: TOPROL-XL Take 100 mg by  mouth daily.   naloxone 4 MG/0.1ML Liqd nasal spray kit Commonly known as: NARCAN Place 4 mg into the nose as needed.   NovoLOG FlexPen 100 UNIT/ML FlexPen Generic drug: insulin aspart Max daily 100 units What changed:  how much to take how to take this when to take this additional instructions Changed by: Dorita Sciara, MD   omeprazole 40 MG capsule Commonly known as: PRILOSEC Take 40 mg by mouth daily.   rosuvastatin 40 MG tablet Commonly known as: CRESTOR Take 40 mg by mouth daily.   spironolactone-hydrochlorothiazide 25-25 MG tablet Commonly known as: ALDACTAZIDE Take 1 tablet by mouth daily.   sucralfate 1 g tablet Commonly known as: CARAFATE  Take 1 g by mouth 2 (two) times daily with a meal.         ALLERGIES: Allergies  Allergen Reactions   Exenatide Nausea And Vomiting and Other (See Comments)    Pancreatitis history    Lisinopril Other (See Comments)    Cough, Pancreatitis    Penicillins Itching, Rash and Hives    Has patient had a PCN reaction causing immediate rash, facial/tongue/throat swelling, SOB or lightheadedness with hypotension: Yes Has patient had a PCN reaction causing severe rash involving mucus membranes or skin necrosis: Yes Has patient had a PCN reaction that required hospitalization: Yes Has patient had a PCN reaction occurring within the last 10 years: No If all of the above answers are "NO", then may proceed with Cephalosporin use.  Has patient had a PCN reaction causing immediate rash, facial/tongue/throat swelling, SOB or lightheadedness with hypotension: Yes Has patient had a PCN reaction causing severe rash involving mucus membranes or skin necrosis: Yes Has patient had a PCN reaction that required hospitalization: Yes Has patient had a PCN reaction occurring within the last 10 years: No If all of the above answers are "NO", then may proceed with Cephalosporin use. itching    Canagliflozin Other (See Comments)     Yeast infections   Metformin Diarrhea and Nausea And Vomiting    Nausea and vomiting-caused  hospitalization      REVIEW OF SYSTEMS: A comprehensive ROS was conducted with the patient and is negative except as per HPI and below:  Review of Systems  Musculoskeletal:  Positive for back pain.  Neurological:  Positive for tingling.     OBJECTIVE:   VITAL SIGNS: BP 126/88   Pulse 88   Ht 5' 2"  (1.575 m)   Wt 216 lb (98 kg)   LMP 06/28/2016   SpO2 99%   BMI 39.51 kg/m    PHYSICAL EXAM:  General: Pt appears well and is in NAD  Neck: General: Supple without adenopathy or carotid bruits. Thyroid: Thyroid size normal.  No goiter or nodules appreciated. No thyroid bruit.  Lungs: Clear with good BS bilat with no rales, rhonchi, or wheezes  Heart: RRR with normal S1 and S2 and no gallops; no murmurs; no rub  Abdomen: Normoactive bowel sounds, soft, nontender, without masses or organomegaly palpable  Extremities:  Lower extremities - No pretibial edema. No lesions.  Skin: Normal texture and temperature to palpation. No rash noted. +  Acanthosis nigricans and  lipohypertrophy.  Neuro: MS is good with appropriate affect, pt is alert and Ox3     DATA REVIEWED:  Lab Results  Component Value Date   HGBA1C 8.4 (A) 08/03/2020   HGBA1C 13.5 (H) 06/30/2016   Lab Results  Component Value Date   CREATININE 1.28 (H) 07/19/2020   05/19/2020 Cortisol free, urine 59.4 MCG/20 4H Saliva cortisol at 2300                            1. 0.05 MCG/DL                          2.  0.21 MCG/DL    Old records , labs and images have been reviewed.   ASSESSMENT / PLAN / RECOMMENDATIONS:   1) Type 2 Diabetes Mellitus, poorly controlled, With Neuropathic  complications - Most recent A1c of 8.4 %. Goal A1c < 7.0 %.    Plan: GENERAL: I have discussed with  the patient the pathophysiology of diabetes. We went over the natural progression of the disease. We talked about both insulin resistance and  insulin deficiency. We stressed the importance of lifestyle changes including diet and exercise. I explained the complications associated with diabetes including retinopathy, nephropathy, neuropathy as well as increased risk of cardiovascular disease. We went over the benefit seen with glycemic control.   I explained to the patient that diabetic patients are at higher than normal risk for amputations.  We discussed the importance of avoiding sugar sweetened beverages as well as avoiding snacks, I have discussed low carb snack options if necessary Limited data today as she did not bring any glucose data, she has a Dexcom at home but has not been using it lately, I have encouraged patient to start using the CGM Due to her report of overnight hypoglycemia I am going to decrease basal insulin as below She is also going to be given a correction scale to be used for NovoLog She is intolerant to metformin Due to history of pancreatitis, GLP 1 agonist and DPP 4 inhibitors are CONTRAINDICATED  MEDICATIONS: Continue Farxiga 10 mg daily Decrease Lantus to 66 units twice daily Continue NovoLog 20 units 3 times daily before every meal CF: NovoLog (BG -130/20)  EDUCATION / INSTRUCTIONS: BG monitoring instructions: Patient is instructed to check her blood sugars 3 times a day, before meals . Call [facility] if: BG persistently < 70  I reviewed the Rule of 15 for the treatment of hypoglycemia in detail with the patient. Literature supplied.   2) Diabetic complications:  Eye: Does not have known diabetic retinopathy.  Neuro/ Feet: Does  have known diabetic peripheral neuropathy. Renal: Patient does not have known baseline CKD. She is on an ARB at present.   3) Elevated Cortisol :    -She has been screened by her PCP due to the presence of a buffalo hump, her 24-hour urinary excretion of cortisol came back elevated, one of her saliva cortisol samples came back abnormal but the second  sample normal. -She does not endorse any exogenous steroid intake for the past 3 years -We will proceed with cortisol check, ACTH check, and repeat 24-hour urinary excretion of cortisol and we will proceed from there   Follow-up in 3 months     Signed electronically by: Mack Guise, MD  [facility]  [facility] [address] [place], [place] [address] Phone: [phone] FAX: [phone]   [place], [facility] [place] [address] Phone: [phone]  Fax: [phone]    Return to Endocrinology clinic as below: Future Appointments  Date Time Provider [place]  08/06/2020 11:30 AM [facility] [place] [facility]  11/05/2020 10:10 AM Jakarie Pember, Melanie Crazier, MD [facility] None

## 2020-08-05 LAB — ACTH: C206 ACTH: 24 pg/mL (ref 6–50)

## 2020-08-06 ENCOUNTER — Other Ambulatory Visit: Payer: Medicaid Other

## 2020-08-06 ENCOUNTER — Encounter: Payer: Self-pay | Admitting: Internal Medicine

## 2020-08-20 ENCOUNTER — Ambulatory Visit
Admission: RE | Admit: 2020-08-20 | Discharge: 2020-08-20 | Disposition: A | Discharge status: Home / Self Care | Payer: Medicaid Other | Source: Ambulatory Visit | Attending: Nephrology | Admitting: Nephrology

## 2020-08-20 ENCOUNTER — Other Ambulatory Visit: Payer: Self-pay

## 2020-08-20 ENCOUNTER — Other Ambulatory Visit (INDEPENDENT_AMBULATORY_CARE_PROVIDER_SITE_OTHER): Payer: Medicaid Other

## 2020-08-20 DIAGNOSIS — N1831 Chronic kidney disease, stage 3a: Secondary | ICD-10-CM

## 2020-08-20 DIAGNOSIS — R7989 Other specified abnormal findings of blood chemistry: Secondary | ICD-10-CM

## 2020-08-26 LAB — CORTISOL, URINE, 24 HOUR
24 Hour urine volume (VMAHVA): 1900 mL
CREATININE, URINE: 1.25 g/(24.h) (ref 0.50–2.15)
Cortisol (Ur), Free: 9.6 mcg/24 h (ref 4.0–50.0)

## 2020-08-27 ENCOUNTER — Encounter: Payer: Self-pay | Admitting: Internal Medicine

## 2020-10-22 ENCOUNTER — Emergency Department (HOSPITAL_COMMUNITY)
Admission: EM | Admit: 2020-10-22 | Discharge: 2020-10-22 | Disposition: A | Discharge status: Home / Self Care | Payer: Medicaid Other | Attending: Emergency Medicine | Admitting: Emergency Medicine

## 2020-10-22 ENCOUNTER — Encounter (HOSPITAL_COMMUNITY): Payer: Self-pay | Admitting: Emergency Medicine

## 2020-10-22 ENCOUNTER — Emergency Department (HOSPITAL_COMMUNITY): Payer: Medicaid Other

## 2020-10-22 ENCOUNTER — Other Ambulatory Visit: Payer: Self-pay

## 2020-10-22 DIAGNOSIS — G8929 Other chronic pain: Secondary | ICD-10-CM | POA: Diagnosis not present

## 2020-10-22 DIAGNOSIS — E1140 Type 2 diabetes mellitus with diabetic neuropathy, unspecified: Secondary | ICD-10-CM | POA: Insufficient documentation

## 2020-10-22 DIAGNOSIS — I10 Essential (primary) hypertension: Secondary | ICD-10-CM | POA: Insufficient documentation

## 2020-10-22 DIAGNOSIS — Z794 Long term (current) use of insulin: Secondary | ICD-10-CM | POA: Insufficient documentation

## 2020-10-22 DIAGNOSIS — Z79899 Other long term (current) drug therapy: Secondary | ICD-10-CM | POA: Diagnosis not present

## 2020-10-22 DIAGNOSIS — M25512 Pain in left shoulder: Secondary | ICD-10-CM | POA: Insufficient documentation

## 2020-10-22 MED ORDER — METHOCARBAMOL 500 MG PO TABS
500.0000 mg | ORAL_TABLET | Freq: Once | ORAL | Status: AC
  Administered 2020-10-22: 500 mg via ORAL
  Filled 2020-10-22: qty 1

## 2020-10-22 MED ORDER — OXYCODONE-ACETAMINOPHEN 5-325 MG PO TABS
1.0000 | ORAL_TABLET | Freq: Once | ORAL | Status: AC
  Administered 2020-10-22: 1 via ORAL
  Filled 2020-10-22: qty 1

## 2020-10-22 NOTE — ED Provider Notes (Signed)
[facility] EMERGENCY DEPARTMENT Provider Note   CSN: [id number] Arrival date & time: 10/22/20  0747     History Chief Complaint  Patient presents with   Shoulder Pain    Alexandria Meyer is a 50 y.o. female with a past medical history significant for diabetes and hypertension who presents to the ED due to acute on chronic left shoulder pain. Patient sees orthopedics and does PT and received steroid injections. She notes she lifted a heavy backpack yesterday causing severe pain down her left arm, mostly at the shoulder.  Denies associated numbness/tingling.  No fever or chills.  Denies erythema, edema, or warmth.  She is on chronic hydrocodone and Robaxin; however has not taken it today. Pain worse with movement.   History obtained from patient and past medical records. No interpreter used during encounter.       Past Medical History:  Diagnosis Date   Diabetes mellitus    Hypertension    Neuropathy in diabetes [facility])    Osteoarthritis    Scoliosis     There are no problems to display for this patient.   Past Surgical History:  Procedure Laterality Date   BACK SURGERY  2014   CESAREAN SECTION     TUBAL LIGATION       OB History   No obstetric history on file.     Family History  Problem Relation Age of Onset   Diabetes Mother    Diabetes Father     Social History   Tobacco Use   Smoking status: Never   Smokeless tobacco: Never  Vaping Use   Vaping Use: Never used  Substance Use Topics   Alcohol use: No   Drug use: No    Home Medications Prior to Admission medications   Medication Sig Start Date End Date Taking? Authorizing Provider  acyclovir (ZOVIRAX) 800 MG tablet Take 800 mg by mouth 5 (five) times daily. 06/23/20   [provider]  albuterol (VENTOLIN HFA) 108 (90 Base) MCG/ACT inhaler Inhale 2 puffs into the lungs every 6 (six) hours as needed for wheezing.    [provider]  Continuous Blood Gluc Sensor  (DEXCOM G6 SENSOR) MISC SMARTSIG:Topical Every 10 Days 07/09/20   [provider]  Continuous Blood Gluc Transmit (DEXCOM G6 TRANSMITTER) MISC USE TO MONITOR GLUCOSE 07/10/20   [provider]  dapagliflozin propanediol (FARXIGA) 10 MG TABS tablet Take 1 tablet (10 mg total) by mouth daily. 08/03/20   Shamleffer, Melanie Crazier, MD  diclofenac Sodium (VOLTAREN) 1 % GEL Apply 2-4 g topically 3 (three) times daily. 07/07/20   [provider]  diphenhydrAMINE HCl (ALLERGY MED PO) Take 1 tablet by mouth daily as needed (allergies).    [provider]  gabapentin (NEURONTIN) 300 MG capsule Take 600 mg by mouth 3 (three) times daily.     [provider]  HYDROcodone-acetaminophen (NORCO) 10-325 MG tablet Take 1 tablet by mouth every 6 (six) hours as needed for moderate pain.    [provider]  Insulin Pen Needle (B-D UF III MINI PEN NEEDLES) 31G X 5 MM MISC 1 Device by Does not apply route in the morning, at noon, in the evening, and at bedtime. 08/03/20   Shamleffer, Melanie Crazier, MD  LANTUS SOLOSTAR 100 UNIT/ML Solostar Pen Inject 66 Units into the skin in the morning and at bedtime. 08/03/20   Shamleffer, Melanie Crazier, MD  losartan (COZAAR) 100 MG tablet Take 1 tablet by mouth daily. 06/17/20  [provider]  methocarbamol (ROBAXIN) 750 MG tablet Take 750 mg by mouth 3 (three) times daily as needed for muscle spasms. 05/28/20   [provider]  metoprolol succinate (TOPROL-XL) 50 MG 24 hr tablet Take 100 mg by mouth daily. 07/12/20   [provider]  naloxone [facility]) nasal spray 4 mg/0.1 mL Place 4 mg into the nose as needed. 01/15/20   [provider]  NOVOLOG FLEXPEN 100 UNIT/ML FlexPen Max daily 100 units 08/03/20   Shamleffer, Melanie Crazier, MD  omeprazole (PRILOSEC) 40 MG capsule Take 40 mg by mouth daily. 06/16/20   [provider]  rosuvastatin (CRESTOR) 40 MG tablet Take 40 mg by mouth daily. 06/16/20    [provider]  spironolactone-hydrochlorothiazide (ALDACTAZIDE) 25-25 MG tablet Take 1 tablet by mouth daily. 06/23/20   [provider]  sucralfate (CARAFATE) 1 g tablet Take 1 g by mouth 2 (two) times daily with a meal. 03/17/20   [provider]    Allergies    Exenatide, Lisinopril, Penicillins, Canagliflozin, and Metformin  Review of Systems   Review of Systems  Constitutional:  Negative for chills and fever.  Musculoskeletal:  Positive for arthralgias.  Skin:  Negative for color change.  All other systems reviewed and are negative.  Physical Exam Updated Vital Signs BP (!) 152/78   Pulse 86   Temp (!) 97.5 F (36.4 C) (Oral)   Resp 18   LMP 06/28/2016   SpO2 100%   Physical Exam Vitals and nursing note reviewed.  Constitutional:      General: She is not in acute distress.    Appearance: She is not ill-appearing.  HENT:     Head: Normocephalic.  Eyes:     Pupils: Pupils are equal, round, and reactive to light.  Cardiovascular:     Rate and Rhythm: Normal rate and regular rhythm.     Pulses: Normal pulses.     Heart sounds: Normal heart sounds. No murmur heard.   No friction rub. No gallop.  Pulmonary:     Effort: Pulmonary effort is normal.     Breath sounds: Normal breath sounds.  Abdominal:     General: Abdomen is flat. There is no distension.     Palpations: Abdomen is soft.     Tenderness: There is no abdominal tenderness. There is no guarding or rebound.  Musculoskeletal:        General: Normal range of motion.     Cervical back: Neck supple.     Comments: TTP throughout left shoulder. No erythema, edema, or warmth. Left arm in sling. Radial pulse intact. Soft compartments.   Skin:    General: Skin is warm and dry.  Neurological:     General: No focal deficit present.     Mental Status: She is alert.  Psychiatric:        Mood and Affect: Mood normal.        Behavior: Behavior normal.    ED Results / Procedures /  Treatments   Labs (all labs ordered are listed, but only abnormal results are displayed) Labs Reviewed - No data to display  EKG None  Radiology DG Shoulder Left  Result Date: 10/22/2020 CLINICAL DATA:  Shoulder pain EXAM: LEFT SHOULDER - 2+ VIEW COMPARISON:  Left shoulder radiographs 07/19/2020 FINDINGS: There is no acute fracture or dislocation. Glenohumeral and acromioclavicular alignment is normal. The joint spaces are preserved. The soft tissues are unremarkable. IMPRESSION: No acute fracture or dislocation. Electronically Signed  By: Valetta Mole M.D.   On: 10/22/2020 08:37    Procedures Procedures   Medications Ordered in ED Medications  oxyCODONE-acetaminophen (PERCOCET/ROXICET) 5-325 MG per tablet 1 tablet (has no administration in time range)  methocarbamol (ROBAXIN) tablet 500 mg (has no administration in time range)    ED Course  I have reviewed the triage vital signs and the nursing notes.  Pertinent labs & imaging results that were available during my care of the patient were reviewed by me and considered in my medical decision making (see chart for details).    MDM Rules/Calculators/A&P                           50 year old female presents to the ED due to acute on chronic left shoulder pain.  Patient is followed by orthopedics and undergoes physical therapy and steroid injections.  She notes she lifted a heavy backpack yesterday causing worsening pain.  She is on chronic pain meds.  Upon arrival, stable vitals.  Patient in no acute distress.  Physical exam significant for tenderness throughout left shoulder.  No erythema, edema, or warmth.  Low suspicion for septic joint.  Left upper extremity neurovascularly intact with soft compartments.  Low suspicion for compartment syndrome.  Patient given Percocet and Robaxin.  Advised patient to follow-up with her orthopedic surgeon for further evaluation.  X-ray personally reviewed which is negative for any acute  abnormalities. No bony fractures. Strict ED precautions discussed with patient. Patient states understanding and agrees to plan. Patient discharged home in no acute distress and stable vitals  Final Clinical Impression(s) / ED Diagnoses Final diagnoses:  Chronic left shoulder pain    Rx / DC Orders ED Discharge Orders     None        Karie Kirks 10/22/20 1256    Lajean Saver, MD 10/23/20 1504

## 2020-10-22 NOTE — Discharge Instructions (Signed)
It was a pleasure taking care of you today. As discussed, your x-ray did not show any broken bones. Please follow-up with your orthopedic surgeon for further evaluation. Take your pain medication as prescribed. Return to the ER for new or worsening symptoms.

## 2020-10-22 NOTE — ED Triage Notes (Signed)
Pt states she feels like she pulled something in her left shoulder. She is only supposed to hold 10lbs on the left arm, and she lifted her back pack this morning. She felt something pop.

## 2020-11-05 ENCOUNTER — Ambulatory Visit: Payer: Medicaid Other | Admitting: Internal Medicine

## 2020-11-05 NOTE — Progress Notes (Deleted)
Name: Alexandria Meyer  Age/ Sex: 50 y.o., female   MRN/ DOB: [id number], 12-29-1970     PCP: [facility]   Reason for Endocrinology Evaluation: Type 2 Diabetes Mellitus  Initial Endocrine Consultative Visit: 08/03/2020    PATIENT IDENTIFIER: Alexandria Meyer is a 50 y.o. female with a past medical history of T2DM, HTN, with Hx of Pancreatitis . The patient has followed with Endocrinology clinic since 08/03/2020 for consultative assistance with management of her diabetes.  DIABETIC HISTORY:  Alexandria Meyer was diagnosed with DM at age 18 , started as gestational diabetes , was on Trulicity in 4081 , intolerant to metformin. Her hemoglobin A1c has ranged from 9.1% in 04/2020, peaking at 13.5% in 2018.    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 The patient has *** had hypoglycemic episodes since the last clinic visit, which typically occur *** x / - most often occuring ***. The patient is *** symptomatic with these episodes, with symptoms of {symptoms; hypoglycemia:[id number]}.   HOME DIABETES REGIMEN:    Statin: yes ACE-I/ARB: yes Prior Diabetic Education: no   METER DOWNLOAD SUMMARY: Date range evaluated: *** Fingerstick Blood Glucose Tests = *** Average Number Tests/Day =  *** Overall Mean FS Glucose = *** Standard Deviation = ***  BG Ranges: Low = *** High = ***   Hypoglycemic Events/30 Days: BG < 50 = *** Episodes of symptomatic severe hypoglycemia = ***    DIABETIC COMPLICATIONS: Microvascular complications:  Neuropathy  Denies: CKD, retinopathy Last Eye Exam: Completed 2022  Macrovascular complications:   Denies: CAD, CVA, PVD   HISTORY:  Past Medical History:  Past Medical History:  Diagnosis Date   Diabetes mellitus    Hypertension    Neuropathy in diabetes ([place])    Osteoarthritis    Scoliosis    Past Surgical History:  Past Surgical History:  Procedure Laterality Date   BACK SURGERY  2014   CESAREAN SECTION     TUBAL LIGATION     Social History:  reports that she has never smoked. She has never used smokeless tobacco. She reports that she does not drink alcohol and does not use drugs. Family History:  Family History  Problem Relation Age of Onset   Diabetes Mother    Diabetes Father      HOME MEDICATIONS: Allergies as of 11/05/2020       Reactions   Exenatide Nausea And Vomiting, Other (See Comments)   Pancreatitis history   Lisinopril Other (See Comments)   Cough, Pancreatitis   Penicillins Itching, Rash, Hives   Has patient had a PCN reaction causing immediate rash, facial/tongue/throat swelling, SOB or lightheadedness with hypotension: Yes Has patient had a PCN reaction causing severe rash involving  mucus membranes or skin necrosis: Yes Has patient had a PCN reaction that required hospitalization: Yes Has patient had a PCN reaction occurring within the last 10 years: No If all of the above answers are "NO", then may proceed with Cephalosporin use. Has patient had a PCN reaction causing immediate rash, facial/tongue/throat swelling, SOB or lightheadedness with hypotension: Yes Has patient had a PCN reaction causing severe rash involving mucus membranes or skin necrosis: Yes Has patient had a PCN reaction that  required hospitalization: Yes Has patient had a PCN reaction occurring within the last 10 years: No If all of the above answers are "NO", then may proceed with Cephalosporin use. itching   Canagliflozin Other (See Comments)   Yeast infections   Metformin Diarrhea, Nausea And Vomiting   Nausea and vomiting-caused  hospitalization        Medication List        Accurate as of November 05, 2020  8:57 AM. If you have any questions, ask your nurse or doctor.          acyclovir 800 MG tablet Commonly known as: ZOVIRAX Take 800 mg by mouth 5 (five) times daily.   albuterol 108 (90 Base) MCG/ACT inhaler Commonly known as: VENTOLIN HFA Inhale 2 puffs into the lungs every 6 (six) hours as needed for wheezing.   ALLERGY MED PO Take 1 tablet by mouth daily as needed (allergies).   B-D UF III MINI PEN NEEDLES 31G X 5 MM Misc Generic drug: Insulin Pen Needle 1 Device by Does not apply route in the morning, at noon, in the evening, and at bedtime.   dapagliflozin propanediol 10 MG Tabs tablet Commonly known as: FARXIGA Take 1 tablet (10 mg total) by mouth daily.   Dexcom G6 Sensor Misc SMARTSIG:Topical Every 10 Days   Dexcom G6 Transmitter Misc USE TO MONITOR GLUCOSE   diclofenac Sodium 1 % Gel Commonly known as: VOLTAREN Apply 2-4 g topically 3 (three) times daily.   gabapentin 300 MG capsule Commonly known as: NEURONTIN Take 600 mg by mouth 3 (three) times daily.   HYDROcodone-acetaminophen 10-325 MG tablet Commonly known as: NORCO Take 1 tablet by mouth every 6 (six) hours as needed for moderate pain.   Lantus SoloStar 100 UNIT/ML Solostar Pen Generic drug: insulin glargine Inject 66 Units into the skin in the morning and at bedtime.   losartan 100 MG tablet Commonly known as: COZAAR Take 1 tablet by mouth daily.   methocarbamol 750 MG tablet Commonly known as: ROBAXIN Take 750 mg by mouth 3 (three) times daily as needed for muscle spasms.   metoprolol  succinate 50 MG 24 hr tablet Commonly known as: TOPROL-XL Take 100 mg by mouth daily.   naloxone 4 MG/0.1ML Liqd nasal spray kit Commonly known as: NARCAN Place 4 mg into the nose as needed.   NovoLOG FlexPen 100 UNIT/ML FlexPen Generic drug: insulin aspart Max daily 100 units   omeprazole 40 MG capsule Commonly known as: PRILOSEC Take 40 mg by mouth daily.   rosuvastatin 40 MG tablet Commonly known as: CRESTOR Take 40 mg by mouth daily.   spironolactone-hydrochlorothiazide 25-25 MG tablet Commonly known as: ALDACTAZIDE Take 1 tablet by mouth daily.   sucralfate 1 g tablet Commonly known as: CARAFATE Take 1 g by mouth 2 (two) times daily with a meal.         OBJECTIVE:   Vital Signs: LMP 06/28/2016   Wt Readings from Last 3 Encounters:  08/03/20 216 lb (98 kg)  07/18/20 213 lb (96.6 kg)  04/28/20 220 lb (99.8 kg)     Exam: General: Pt appears well and is in NAD  Hydration: Well-hydrated with moist mucous membranes and good skin turgor  HEENT: Head: Unremarkable with good dentition. Oropharynx clear without exudate.  Eyes: External eye exam normal without stare, lid lag or exophthalmos.  EOM intact.  PERRL.  Neck: General: Supple without adenopathy. Thyroid: Thyroid size normal.  No goiter or nodules appreciated. No thyroid bruit.  Lungs: Clear with good BS bilat with no rales, rhonchi, or wheezes  Heart: RRR with normal S1 and S2 and no gallops; no murmurs; no rub  Abdomen: Normoactive bowel sounds, soft, nontender, without masses or organomegaly palpable  Extremities: No pretibial edema. No tremor. Normal strength and motion throughout. See detailed diabetic foot exam below.  Skin: Normal texture and temperature to palpation. No rash noted. No Acanthosis nigricans/skin tags. No lipohypertrophy.  Neuro: MS is good with appropriate affect, pt is alert and Ox3    DM foot exam: Please see diabetic assessment flow-sheet detailed below:           DATA  REVIEWED:  Lab Results  Component Value Date   HGBA1C 8.4 (A) 08/03/2020   HGBA1C 13.5 (H) 06/30/2016   Lab Results  Component Value Date   CREATININE 1.32 (H) 08/03/2020     ASSESSMENT / PLAN / RECOMMENDATIONS:   1) Type 2 Diabetes Mellitus, ***controlled, With *** complications - Most recent A1c of *** %. Goal A1c < *** %.  ***    She is intolerant to metformin Due to history of pancreatitis, GLP 1 agonist and DPP 4 inhibitors are CONTRAINDICATED   MEDICATIONS: ***  EDUCATION / INSTRUCTIONS: BG monitoring instructions: Patient is instructed to check her blood sugars *** times a day, ***. Call [facility] if: BG persistently < 70  I reviewed the Rule of 15 for the treatment of hypoglycemia in detail with the patient. Literature supplied.    2) Diabetic complications:  Eye: Does *** have known diabetic retinopathy.  Neuro/ Feet: Does *** have known diabetic peripheral neuropathy .  Renal: Patient does not have known baseline CKD. She   is  on an ACEI/ARB at present.   3) Lipids: Patient is *** on a statin.   3) Elevated Cortisol :      -She has been screened by her PCP due to the presence of a buffalo hump, her 24-hour urinary excretion of cortisol came back elevated, one of her saliva cortisol samples came back abnormal but the second sample normal. - Free urinary cortisol was normal 9.6 mcg 08/2020, ACTH 24 and plasma cortisol normal at 10.3 ug/dL   F/U in ***    Signed electronically by: Mack Guise, MD  [facility]  [facility] [address] [place], [place] [address] Phone: [phone] FAX: [phone]   CC: [facility], [facility] [place] [address] Phone: [phone]  Fax: [phone]  Return to Endocrinology clinic as below: Future Appointments  Date Time Provider [place]  11/05/2020 10:10 AM [place], Melanie Crazier, MD  [facility] None

## 2021-05-21 ENCOUNTER — Ambulatory Visit: Payer: Medicaid Other | Admitting: Internal Medicine

## 2021-05-21 VITALS — BP 118/66 | HR 69 | Wt 209.2 lb

## 2021-05-21 DIAGNOSIS — Z794 Long term (current) use of insulin: Secondary | ICD-10-CM

## 2021-05-21 DIAGNOSIS — E1165 Type 2 diabetes mellitus with hyperglycemia: Secondary | ICD-10-CM

## 2021-05-21 DIAGNOSIS — E1142 Type 2 diabetes mellitus with diabetic polyneuropathy: Secondary | ICD-10-CM

## 2021-05-21 LAB — TSH: TSH: 1.83 u[IU]/mL (ref 0.35–5.50)

## 2021-05-21 LAB — BASIC METABOLIC PANEL
BUN: 52 mg/dL — ABNORMAL HIGH (ref 6–23)
CO2: 35 mEq/L — ABNORMAL HIGH (ref 19–32)
Calcium: 9.4 mg/dL (ref 8.4–10.5)
Chloride: 102 mEq/L (ref 96–112)
Creatinine, Ser: 1.55 mg/dL — ABNORMAL HIGH (ref 0.40–1.20)
GFR: 38.41 mL/min — ABNORMAL LOW (ref >60.00)
Glucose, Bld: 109 mg/dL — ABNORMAL HIGH (ref 70–99)
Potassium: 4.5 mEq/L (ref 3.5–5.1)
Sodium: 142 mEq/L (ref 135–145)

## 2021-05-21 LAB — POCT GLYCOSYLATED HEMOGLOBIN (HGB A1C): Hemoglobin A1C: 7.7 % — AB (ref 4.0–5.6)

## 2021-05-21 LAB — LIPID PANEL
Cholesterol: 144 mg/dL (ref 0–200)
HDL: 34.6 mg/dL — ABNORMAL LOW (ref >39.00)
LDL Cholesterol: 76 mg/dL (ref 0–99)
NonHDL: 109.07
Total CHOL/HDL Ratio: 4
Triglycerides: 163 mg/dL — ABNORMAL HIGH (ref 0.0–149.0)
VLDL: 32.6 mg/dL (ref 0.0–40.0)

## 2021-05-21 LAB — MICROALBUMIN / CREATININE URINE RATIO
Creatinine,U: 74.6 mg/dL
Microalb Creat Ratio: 38.7 mg/g — ABNORMAL HIGH (ref 0.0–30.0)
Microalb, Ur: 28.8 mg/dL — ABNORMAL HIGH (ref 0.0–1.9)

## 2021-05-21 MED ORDER — OMNIPOD 5 DEXG7G6 INTRO GEN 5 KIT: 1.0000 | PACK | 0 refills | Status: DC

## 2021-05-21 MED ORDER — DAPAGLIFLOZIN PROPANEDIOL 10 MG PO TABS
10.0000 mg | ORAL_TABLET | Freq: Every day | ORAL | 3 refills | Status: DC
Start: 2021-05-21 — End: 2023-10-06

## 2021-05-21 MED ORDER — OMNIPOD 5 DEXG7G6 PODS GEN 5 MISC: 1.0000 | 3 refills | Status: DC

## 2021-05-21 NOTE — Progress Notes (Signed)
?Name: Alexandria Meyer  ?MRN/ DOB: [id number], 1970-12-27   ?Age/ Sex: 51 y.o., female   ? ?PCP: [facility]   ?Reason for Endocrinology Evaluation: Type 2 Diabetes Mellitus  ?   ?Date of Initial Endocrinology Visit: 08/03/2020  ? ? ?PATIENT IDENTIFIER: Alexandria Meyer is a 51 y.o. female with a past medical history of T2DM and HN with hx of Pancreatitis ( while living in [place] ) . The patient presented for initial endocrinology clinic visit on 08/03/2020 for consultative assistance with her diabetes management.  ? ? ?HPI: ?Alexandria Meyer was  ? ?Diagnosed with DM at age 78 started with gestational diabetes  ?Prior Medications tried/Intolerance: was on Trulicity in 3976 , intolerant to metformin ?Hemoglobin A1c has ranged from 9.1% in 04/2020, peaking at 13.5% in 2018. ? ?Cushingoid Features: ?She has been noted with buffalo hump and elevated urinary cortisol at 59.4 mcg/24 in 05/2020 ?Salivary cortisol came back abnormal at 0.21 mcg/dL at 11 pm ( 0-0.09) but the second sample was normal at 0.05 mcg/dL .  Repeat 24-hour urinary cortisol was normal at 9.6 mcg on 08/20/2020 ? ? ? ? ?SUBJECTIVE:  ? ?During the last visit (08/03/2020): A1c 8.4% we adjusted basal/prandial regimen and continued [place] ? ?Today (05/21/21): Alexandria Meyer is here for follow-up on diabetes management.  The patient has NOT been to our office in 10 months.  She checks her blood sugars multiple times daily through CGM. The patient has  had hypoglycemic episodes since the last clinic visit, which typically occur 3 x / week - most often occuring middle of the night . The patient is  symptomatic with these episodes, with symptoms.  ? ? ? ? ?HOME DIABETES REGIMEN: ?Farxiga 10 mg daily  ?Lantus 66 units TWIce daily - takes 80 units BID  ?Novolog 20 units twice daily  ?Correction factor: NovoLog (BG -130/20) ? ? ? ? ?Statin: yes ?ACE-I/ARB: yes ?Prior Diabetic Education: no  ? ? ? ?CONTINUOUS GLUCOSE MONITORING RECORD  INTERPRETATION   ? ?Dates of Recording: 4/1 - 05/21/2021 ? ?Sensor description: Dexcom ? ?Results statistics: ?  ?CGM use % of time 100  ?Average and SD 140/48  ?Time in range      78%  ?% Time Above 180 17  ?% Time above 250 2  ?% Time Below target <1  ? ? ?Glycemic patterns summary: BG's are optimal overnight with hyperglycemia noted mainly post dinner ? ?Hyperglycemic episodes  postprandial  ? ?Hypoglycemic episodes occurred during night and day ? ?Overnight periods: trends down  ? ? ? ? ? ?DIABETIC COMPLICATIONS: ?Microvascular complications:  ?Neuropathy ?Denies: CKD, retinopathy  ?Last eye exam: Completed 2022 ? ?Macrovascular complications:  ? ?Denies: CAD, PVD, CVA ? ? ?PAST HISTORY: ?Past Medical History:  ?Past Medical History:  ?Diagnosis Date  ? Diabetes mellitus   ? Hypertension   ? Neuropathy in diabetes [facility])   ? Osteoarthritis   ? Scoliosis   ? ?Past Surgical History:  ?Past Surgical History:  ?Procedure Laterality Date  ? BACK SURGERY  2014  ? CESAREAN SECTION    ? TUBAL LIGATION    ?  ?Social History:  reports that she has never smoked. She has never used smokeless tobacco. She reports that she does not drink alcohol and does not use drugs. ?Family History:  ?Family History  ?Problem Relation Age of Onset  ? Diabetes Mother   ? Diabetes Father   ? ? ? ?HOME MEDICATIONS: ?Allergies as of 05/21/2021   ? ?  Reactions  ? Exenatide Nausea And Vomiting, Other (See Comments)  ? Pancreatitis history  ? Lisinopril Other (See Comments)  ? Cough, Pancreatitis  ? Penicillins Itching, Rash, Hives  ? Has patient had a PCN reaction causing immediate rash, facial/tongue/throat swelling, SOB or lightheadedness with hypotension: Yes ?Has patient had a PCN reaction causing severe rash involving mucus membranes or skin necrosis: Yes ?Has patient had a PCN reaction that required hospitalization: Yes ?Has patient had a PCN reaction occurring within the last 10 years: No ?If all of the above answers are "NO", then may  proceed with Cephalosporin use. ?Has patient had a PCN reaction causing immediate rash, facial/tongue/throat swelling, SOB or lightheadedness with hypotension: Yes ?Has patient had a PCN reaction causing severe rash involving mucus membranes or skin necrosis: Yes ?Has patient had a PCN reaction that required hospitalization: Yes ?Has patient had a PCN reaction occurring within the last 10 years: No ?If all of the above answers are "NO", then may proceed with Cephalosporin use. ?itching  ? Canagliflozin Other (See Comments)  ? Yeast infections  ? Metformin Diarrhea, Nausea And Vomiting  ? Nausea and vomiting-caused  hospitalization  ? ?  ? ?  ?Medication List  ?  ? ?  ? Accurate as of May 21, 2021  1:52 PM. If you have any questions, ask your nurse or doctor.  ?  ?  ? ?  ? ?acyclovir 800 MG tablet ?Commonly known as: ZOVIRAX ?Take 800 mg by mouth 5 (five) times daily. ?  ?albuterol 108 (90 Base) MCG/ACT inhaler ?Commonly known as: VENTOLIN HFA ?Inhale 2 puffs into the lungs every 6 (six) hours as needed for wheezing. ?  ?ALLERGY MED PO ?Take 1 tablet by mouth daily as needed (allergies). ?  ?B-D UF III MINI PEN NEEDLES 31G X 5 MM Misc ?Generic drug: Insulin Pen Needle ?1 Device by Does not apply route in the morning, at noon, in the evening, and at bedtime. ?  ?dapagliflozin propanediol 10 MG Tabs tablet ?Commonly known as: FARXIGA ?Take 1 tablet (10 mg total) by mouth daily. ?  ?Dexcom G6 Sensor Misc ?SMARTSIG:Topical Every 10 Days ?  ?Dexcom G6 Transmitter Misc ?USE TO MONITOR GLUCOSE ?  ?diclofenac Sodium 1 % Gel ?Commonly known as: VOLTAREN ?Apply 2-4 g topically 3 (three) times daily. ?  ?gabapentin 300 MG capsule ?Commonly known as: NEURONTIN ?Take 600 mg by mouth 3 (three) times daily. ?  ?HYDROcodone-acetaminophen 10-325 MG tablet ?Commonly known as: NORCO ?Take 1 tablet by mouth every 6 (six) hours as needed for moderate pain. ?  ?Lantus SoloStar 100 UNIT/ML Solostar Pen ?Generic drug: insulin  glargine ?Inject 66 Units into the skin in the morning and at bedtime. ?  ?losartan 100 MG tablet ?Commonly known as: COZAAR ?Take 1 tablet by mouth daily. ?  ?methocarbamol 750 MG tablet ?Commonly known as: ROBAXIN ?Take 750 mg by mouth 3 (three) times daily as needed for muscle spasms. ?  ?metoprolol succinate 50 MG 24 hr tablet ?Commonly known as: TOPROL-XL ?Take 100 mg by mouth daily. ?  ?naloxone 4 MG/0.1ML Liqd nasal spray kit ?Commonly known as: NARCAN ?Place 4 mg into the nose as needed. ?  ?NovoLOG FlexPen 100 UNIT/ML FlexPen ?Generic drug: insulin aspart ?Max daily 100 units ?  ?omeprazole 40 MG capsule ?Commonly known as: PRILOSEC ?Take 40 mg by mouth daily. ?  ?rosuvastatin 40 MG tablet ?Commonly known as: CRESTOR ?Take 40 mg by mouth daily. ?  ?spironolactone-hydrochlorothiazide 25-25 MG tablet ?Commonly known as:  ALDACTAZIDE ?Take 1 tablet by mouth daily. ?  ?sucralfate 1 g tablet ?Commonly known as: CARAFATE ?Take 1 g by mouth 2 (two) times daily with a meal. ?  ? ?  ? ? ? ?ALLERGIES: ?Allergies  ?Allergen Reactions  ? Exenatide Nausea And Vomiting and Other (See Comments)  ?  Pancreatitis history ?  ? Lisinopril Other (See Comments)  ?  Cough, Pancreatitis ?  ? Penicillins Itching, Rash and Hives  ?  Has patient had a PCN reaction causing immediate rash, facial/tongue/throat swelling, SOB or lightheadedness with hypotension: Yes ?Has patient had a PCN reaction causing severe rash involving mucus membranes or skin necrosis: Yes ?Has patient had a PCN reaction that required hospitalization: Yes ?Has patient had a PCN reaction occurring within the last 10 years: No ?If all of the above answers are "NO", then may proceed with Cephalosporin use. ? ?Has patient had a PCN reaction causing immediate rash, facial/tongue/throat swelling, SOB or lightheadedness with hypotension: Yes ?Has patient had a PCN reaction causing severe rash involving mucus membranes or skin necrosis: Yes ?Has patient had a PCN  reaction that required hospitalization: Yes ?Has patient had a PCN reaction occurring within the last 10 years: No ?If all of the above answers are "NO", then may proceed with Cephalosporin use. ?itching ?  ? Canagliflozin Other (See

## 2021-05-21 NOTE — Patient Instructions (Addendum)
-   Continue Farxiga 10 mg daily  ?- Decrease Lantus to 70 units TWICE daily  ?- Continue Novolog  20 units with each meal  ?Novolog correctional insulin: ADD extra units on insulin to your meal-time Novolg dose if your blood sugars are higher than 155. Use the scale below to help guide you:  ? ?Blood sugar before meal Number of units to inject  ?Less than 150 0 unit  ?151 -  170 1 units  ?171 -  190 2 units  ?191 -  210 3 units  ?211 -  230 4 units  ?231 -  250 5 units  ?251 -  270 6 units  ?271 -  290 7 units  ?291 -  310 8 units  ?311 - 330 9 units  ? ?HOW TO TREAT LOW BLOOD SUGARS (Blood sugar LESS THAN 70 MG/DL) ?Please follow the RULE OF 15 for the treatment of hypoglycemia treatment (when your (blood sugars are less than 70 mg/dL)  ? ?STEP 1: Take 15 grams of carbohydrates when your blood sugar is low, which includes:  ?3-4 GLUCOSE TABS  OR ?3-4 OZ OF JUICE OR REGULAR SODA OR ?ONE TUBE OF GLUCOSE GEL   ? ?STEP 2: RECHECK blood sugar in 15 MINUTES ?STEP 3: If your blood sugar is still low at the 15 minute recheck --> then, go back to STEP 1 and treat AGAIN with another 15 grams of carbohydrates. ? ?

## 2021-05-24 ENCOUNTER — Encounter: Payer: Self-pay | Admitting: Internal Medicine

## 2021-06-09 ENCOUNTER — Encounter (HOSPITAL_COMMUNITY): Payer: Self-pay | Admitting: *Deleted

## 2021-06-09 ENCOUNTER — Emergency Department (HOSPITAL_COMMUNITY): Payer: Medicaid Other

## 2021-06-09 ENCOUNTER — Emergency Department (HOSPITAL_COMMUNITY)
Admission: EM | Admit: 2021-06-09 | Discharge: 2021-06-10 | Disposition: A | Discharge status: Home / Self Care | Payer: Medicaid Other | Attending: Emergency Medicine | Admitting: Emergency Medicine

## 2021-06-09 ENCOUNTER — Other Ambulatory Visit: Payer: Self-pay

## 2021-06-09 DIAGNOSIS — Z7984 Long term (current) use of oral hypoglycemic drugs: Secondary | ICD-10-CM | POA: Insufficient documentation

## 2021-06-09 DIAGNOSIS — I10 Essential (primary) hypertension: Secondary | ICD-10-CM | POA: Diagnosis not present

## 2021-06-09 DIAGNOSIS — M25512 Pain in left shoulder: Secondary | ICD-10-CM | POA: Insufficient documentation

## 2021-06-09 DIAGNOSIS — W109XXA Fall (on) (from) unspecified stairs and steps, initial encounter: Secondary | ICD-10-CM | POA: Insufficient documentation

## 2021-06-09 DIAGNOSIS — Z79899 Other long term (current) drug therapy: Secondary | ICD-10-CM | POA: Insufficient documentation

## 2021-06-09 DIAGNOSIS — M533 Sacrococcygeal disorders, not elsewhere classified: Secondary | ICD-10-CM | POA: Diagnosis not present

## 2021-06-09 DIAGNOSIS — E1140 Type 2 diabetes mellitus with diabetic neuropathy, unspecified: Secondary | ICD-10-CM | POA: Insufficient documentation

## 2021-06-09 DIAGNOSIS — Y9301 Activity, walking, marching and hiking: Secondary | ICD-10-CM | POA: Insufficient documentation

## 2021-06-09 DIAGNOSIS — W19XXXA Unspecified fall, initial encounter: Secondary | ICD-10-CM

## 2021-06-09 DIAGNOSIS — Z794 Long term (current) use of insulin: Secondary | ICD-10-CM | POA: Diagnosis not present

## 2021-06-09 NOTE — ED Triage Notes (Signed)
The pt reports that she fell 2-3 days ago now c/o pain in her head and her back since then  lmp none ?

## 2021-06-09 NOTE — ED Triage Notes (Signed)
Hhe pt is c/o pain in her entire lt side of her body ?

## 2021-06-09 NOTE — ED Provider Triage Note (Signed)
Emergency Medicine Provider Triage Evaluation Note ? ?Micheale Timothy Lasso , a 51 y.o. female  was evaluated in triage.  Pt complains of midline lumbar spine pain and left shoulder pain secondary to a fall.  Patient states that 2 days ago she missed the bottom 3 stairs when walking down the steps and landed on her back.  She has had increasing lumbar spine pain and has had left shoulder pain since the accident.  She did hit her head but denies loss of consciousness. ? ?Review of Systems  ?Positive: Left shoulder pain, lumbar spine pain ?Negative: Loss of consciousness ? ?Physical Exam  ?BP 119/65   Temp 98.2 ?F (36.8 ?C)   Resp 18   Ht '5\' 2"'$  (1.575 m)   Wt 94.9 kg   LMP 06/28/2016   SpO2 97%   BMI 38.27 kg/m?  ?Gen:   Awake, no distress   ?Resp:  Normal effort  ?MSK:   Moves extremities without difficulty  ?Other:   ? ?Medical Decision Making  ?Medically screening exam initiated at 5:25 PM.  Appropriate orders placed.  Keyairra DESHAUN SCHOU was informed that the remainder of the evaluation will be completed by another provider, this initial triage assessment does not replace that evaluation, and the importance of remaining in the ED until their evaluation is complete. ? ? ?  ?Dorothyann Peng, PA-C ?06/09/21 1727 ? ?

## 2021-06-10 ENCOUNTER — Emergency Department (HOSPITAL_COMMUNITY): Payer: Medicaid Other

## 2021-06-10 NOTE — Discharge Instructions (Addendum)
Your imaging today was overall reassuring. Please wear the sleeve until evaluated by orthopedics. Continue to take Percocet for pain. Please call the phone number for Dr. Zachery Dakins (orthopedics) and schedule an appointment. Return to the ER for any new or worsening symptoms  ?

## 2021-06-10 NOTE — ED Notes (Signed)
Patient verbalizes understanding of discharge instructions. Opportunity for questioning and answers were provided. Armband removed by staff, pt discharged from ED. Wheeled out to lobby with son  

## 2021-06-10 NOTE — ED Provider Notes (Signed)
?[place] ?Provider Note ? ? ?CSN: [id number] ?Arrival date & time: 06/09/21  1545 ? ?  ? ?History ? ?Chief Complaint  ?Patient presents with  ? Fall  ? ? ?Alexandria Meyer is a 51 y.o. female. ? ?HPI ?51 year old female with a history of DM type II, hypertension, diabetic neuropathy, osteoarthritis, scoliosis presents to the ER with complaints of left-sided pain.  Patient states that she had fallen down at least 2-3 steps 2 days ago while walking down the stairs in the dark.  She states that she landed on her left side.  She did hit her head on the back wall admitted into the wall but did not lose consciousness.  She is not anticoagulated.  She complains of left shoulder pain and inability to raise her left arm.  Denies any numbness or tingling.  Also complains of back pain and pain in her sacrum.  She states that she has had a history of sacral injury in the past and this feels similar.  She denies any numbness or tingling down her extremities.  No loss of bowel bladder control.  No foot drop.  Seen ambulating in the ER with some pain but still able to ambulate with a cane. ?  ? ?Home Medications ?Prior to Admission medications   ?Medication Sig Start Date End Date Taking? Authorizing Provider  ?acyclovir (ZOVIRAX) 800 MG tablet Take 800 mg by mouth 5 (five) times daily. 06/23/20   [provider]  ?albuterol (VENTOLIN HFA) 108 (90 Base) MCG/ACT inhaler Inhale 2 puffs into the lungs every 6 (six) hours as needed for wheezing.    [provider]  ?Continuous Blood Gluc Sensor (DEXCOM G6 SENSOR) MISC SMARTSIG:Topical Every 10 Days 07/09/20   [provider]  ?Continuous Blood Gluc Transmit (DEXCOM G6 TRANSMITTER) MISC USE TO MONITOR GLUCOSE 07/10/20   [provider]  ?dapagliflozin propanediol (FARXIGA) 10 MG TABS tablet Take 1 tablet (10 mg total) by mouth daily. 05/21/21   Shamleffer, Melanie Crazier, MD  ?diclofenac Sodium (VOLTAREN) 1 % GEL  Apply 2-4 g topically 3 (three) times daily. 07/07/20   [provider]  ?diphenhydrAMINE HCl (ALLERGY MED PO) Take 1 tablet by mouth daily as needed (allergies).    [provider]  ?gabapentin (NEURONTIN) 300 MG capsule Take 600 mg by mouth 3 (three) times daily.     [provider]  ?HYDROcodone-acetaminophen (NORCO) 10-325 MG tablet Take 1 tablet by mouth every 6 (six) hours as needed for moderate pain.    [provider]  ?Insulin Disposable Pump (OMNIPOD 5 G6 INTRO, GEN 5,) KIT 1 Device by Does not apply route every other day. 05/21/21   Shamleffer, Melanie Crazier, MD  ?Insulin Disposable Pump (OMNIPOD 5 G6 POD, GEN 5,) MISC 1 Device by Does not apply route every other day. 05/21/21   Shamleffer, Melanie Crazier, MD  ?Insulin Pen Needle (B-D UF III MINI PEN NEEDLES) 31G X 5 MM MISC 1 Device by Does not apply route in the morning, at noon, in the evening, and at bedtime. 08/03/20   Shamleffer, Melanie Crazier, MD  ?LANTUS SOLOSTAR 100 UNIT/ML Solostar Pen Inject 66 Units into the skin in the morning and at bedtime. 08/03/20   Shamleffer, Melanie Crazier, MD  ?losartan (COZAAR) 100 MG tablet Take 1 tablet by mouth daily. 06/17/20   [provider]  ?methocarbamol (ROBAXIN) 750 MG tablet Take 750 mg by mouth 3 (three) times daily as needed for muscle spasms. 05/28/20  [provider]  ?metoprolol succinate (TOPROL-XL) 50 MG 24 hr tablet Take 100 mg by mouth daily. 07/12/20   [provider]  ?naloxone (NARCAN) nasal spray 4 mg/0.1 mL Place 4 mg into the nose as needed. 01/15/20   [provider]  ?Cira Servant FLEXPEN 100 UNIT/ML FlexPen Max daily 100 units 08/03/20   Shamleffer, Melanie Crazier, MD  ?omeprazole (PRILOSEC) 40 MG capsule Take 40 mg by mouth daily. 06/16/20   [provider]  ?rosuvastatin (CRESTOR) 40 MG tablet Take 40 mg by mouth daily. 06/16/20   [provider]  ?spironolactone-hydrochlorothiazide (ALDACTAZIDE) 25-25 MG  tablet Take 1 tablet by mouth daily. 06/23/20   [provider]  ?sucralfate (CARAFATE) 1 g tablet Take 1 g by mouth 2 (two) times daily with a meal. 03/17/20   [provider]  ?   ? ?Allergies    ?Exenatide, Lisinopril, Penicillins, Canagliflozin, and Metformin   ? ?Review of Systems   ?Review of Systems ?Ten systems reviewed and are negative for acute change, except as noted in the HPI.  ? ?Physical Exam ?Updated Vital Signs ?BP (!) 168/92   Pulse 62   Temp 98.2 ?F (36.8 ?C)   Resp 16   Ht 5' 2"  (1.575 m)   Wt 94.9 kg   LMP 06/28/2016   SpO2 98%   BMI 38.27 kg/m?  ?Physical Exam ?Vitals and nursing note reviewed.  ?Constitutional:   ?   General: She is not in acute distress. ?   Appearance: She is well-developed.  ?HENT:  ?   Head: Normocephalic and atraumatic.  ?Eyes:  ?   Conjunctiva/sclera: Conjunctivae normal.  ?Cardiovascular:  ?   Rate and Rhythm: Normal rate and regular rhythm.  ?   Heart sounds: No murmur heard. ?Pulmonary:  ?   Effort: Pulmonary effort is normal. No respiratory distress.  ?   Breath sounds: Normal breath sounds.  ?Abdominal:  ?   Palpations: Abdomen is soft.  ?   Tenderness: There is no abdominal tenderness.  ?Musculoskeletal:     ?   General: No swelling.  ?   Cervical back: Neck supple.  ?   Comments: No cervical or thoracic midline tenderness, + mildline lumbar spine tenderness, no step offs or crepitus.  5/5 strength in upper and lower extremities bilaterally.  She does have tenderness to palpation over her sacrum.  Seen ambulating with no evidence of foot drop.  Sensations are intact in upper and lower extremities.  Unable to lift left arm above midline.  2+ radial pulses.  ?Skin: ?   General: Skin is warm and dry.  ?   Capillary Refill: Capillary refill takes less than 2 seconds.  ?Neurological:  ?   General: No focal deficit present.  ?   Mental Status: She is alert and oriented to person, place, and time.  ?Psychiatric:     ?   Mood and Affect: Mood normal.   ? ? ?ED Results / Procedures / Treatments   ?Labs ?(all labs ordered are listed, but only abnormal results are displayed) ?Labs Reviewed - No data to display ? ?EKG ?None ? ?Radiology ?DG Lumbar Spine Complete ? ?Result Date: 06/09/2021 ?CLINICAL DATA:  Midline lumbar spine tenderness after fall EXAM: LUMBAR SPINE - COMPLETE 4+ VIEW COMPARISON:  06/16/2016 FINDINGS: Frontal, bilateral oblique, lateral views of the lumbar spine are obtained. 5 non-rib-bearing lumbar type vertebral bodies in grossly normal alignment. No acute displaced fracture. Mild multilevel spondylosis greatest at L5-S1. Mild facet hypertrophic changes  at L4-5 and L5-S1. Sacroiliac joints are unremarkable. Aortic atherosclerosis. IMPRESSION: 1. Lower lumbar spondylosis and facet hypertrophy greatest at the lumbosacral junction. 2. No acute fracture. Electronically Signed   By: Randa Ngo M.D.   On: 06/09/2021 18:14  ? ?DG Sacrum/Coccyx ? ?Result Date: 06/10/2021 ?CLINICAL DATA:  Fall. EXAM: SACRUM AND COCCYX - 2+ VIEW COMPARISON:  None Available. FINDINGS: No displaced fracture. Alignment is anatomic. There are mild degenerative changes at L5-S1. Soft tissues are within normal limits. IMPRESSION: Negative. Electronically Signed   By: Ronney Asters M.D.   On: 06/10/2021 00:55  ? ?DG Shoulder Left ? ?Result Date: 06/09/2021 ?CLINICAL DATA:  Left shoulder pain after fall. EXAM: LEFT SHOULDER - 2+ VIEW COMPARISON:  Left shoulder radiographs 10/22/2020 FINDINGS: Normal alignment. Mild inferior glenoid degenerative osteophytosis. No acute fracture or dislocation. The visualized portion of the left lung is unremarkable. IMPRESSION: No acute fracture. Electronically Signed   By: Yvonne Kendall M.D.   On: 06/09/2021 18:15   ? ?Procedures ?Procedures  ? ? ?Medications Ordered in ED ?Medications - No data to display ? ?ED Course/ Medical Decision Making/ A&P ?  ?                        ?Medical Decision Making ?Amount and/or Complexity of Data  Reviewed ?Radiology: ordered. ? ?51 year old female presents to the ER after a fall 2 days ago.  Complaining of left shoulder pain, back pain and coccyx pain.  She states that she did hit her head but did not lose consciousne

## 2021-07-06 NOTE — Progress Notes (Signed)
Office Visit Note  Patient: Alexandria Meyer             Date of Birth: [birth year]           MRN: [id number]             PCP: [facility] Referring: Terald Sleeper, PA-C Visit Date: 07/07/2021 Occupation: '@GUAROCC'$ @  Subjective:  No chief complaint on file.   History of Present Illness: Alexandria Meyer is a 51 y.o. female ***   Activities of Daily Living:  Patient reports morning stiffness for *** {minute/hour:19697}.   Patient {ACTIONS;DENIES/REPORTS:21021675::"Denies"} nocturnal pain.  Difficulty dressing/grooming: {ACTIONS;DENIES/REPORTS:21021675::"Denies"} Difficulty climbing stairs: {ACTIONS;DENIES/REPORTS:21021675::"Denies"} Difficulty getting out of chair: {ACTIONS;DENIES/REPORTS:21021675::"Denies"} Difficulty using hands for taps, buttons, cutlery, and/or writing: {ACTIONS;DENIES/REPORTS:21021675::"Denies"}  No Rheumatology ROS completed.   PMFS History:  Patient Active Problem List   Diagnosis Date Noted  . Type 2 diabetes mellitus with diabetic polyneuropathy, with long-term current use of insulin ([place]) 05/21/2021  . Type 2 diabetes mellitus with hyperglycemia, with long-term current use of insulin ([place]) 05/21/2021    Past Medical History:  Diagnosis Date  . Diabetes mellitus   . Hypertension   . Neuropathy in diabetes ([place])   . Osteoarthritis   . Scoliosis     Family History  Problem Relation Age of Onset  . Diabetes Mother   . Diabetes Father    Past Surgical History:  Procedure Laterality Date  . BACK SURGERY  2014  . CESAREAN SECTION    . TUBAL LIGATION     Social History   Social History Narrative  . Not on file   Immunization History  Administered Date(s) Administered  . Moderna Sars-Covid-2 Vaccination 05/17/2019, 06/14/2019     Objective: Vital Signs: LMP 06/28/2016    Physical Exam   Musculoskeletal Exam: ***  CDAI Exam: CDAI Score: -- Patient Global: --; Provider Global: -- Swollen: --; Tender:  -- Joint Exam 07/07/2021   No joint exam has been documented for this visit   There is currently no information documented on the homunculus. Go to the Rheumatology activity and complete the homunculus joint exam.  Investigation: No additional findings.  Imaging: DG Lumbar Spine Complete  Result Date: 06/09/2021 CLINICAL DATA:  Midline lumbar spine tenderness after fall EXAM: LUMBAR SPINE - COMPLETE 4+ VIEW COMPARISON:  06/16/2016 FINDINGS: Frontal, bilateral oblique, lateral views of the lumbar spine are obtained. 5 non-rib-bearing lumbar type vertebral bodies in grossly normal alignment. No acute displaced fracture. Mild multilevel spondylosis greatest at L5-S1. Mild facet hypertrophic changes at L4-5 and L5-S1. Sacroiliac joints are unremarkable. Aortic atherosclerosis. IMPRESSION: 1. Lower lumbar spondylosis and facet hypertrophy greatest at the lumbosacral junction. 2. No acute fracture. Electronically Signed   By: Randa Ngo M.D.   On: 06/09/2021 18:14   DG Sacrum/Coccyx  Result Date: 06/10/2021 CLINICAL DATA:  Fall. EXAM: SACRUM AND COCCYX - 2+ VIEW COMPARISON:  None Available. FINDINGS: No displaced fracture. Alignment is anatomic. There are mild degenerative changes at L5-S1. Soft tissues are within normal limits. IMPRESSION: Negative. Electronically Signed   By: Ronney Asters M.D.   On: 06/10/2021 00:55   DG Shoulder Left  Result Date: 06/09/2021 CLINICAL DATA:  Left shoulder pain after fall. EXAM: LEFT SHOULDER - 2+ VIEW COMPARISON:  Left shoulder radiographs 10/22/2020 FINDINGS: Normal alignment. Mild inferior glenoid degenerative osteophytosis. No acute fracture or dislocation. The visualized portion of the left lung is unremarkable. IMPRESSION: No acute fracture. Electronically Signed   By: Yvonne Kendall  M.D.   On: 06/09/2021 18:15    Recent Labs: Lab Results  Component Value Date   WBC 8.0 07/19/2020   HGB 13.5 07/19/2020   PLT 251 07/19/2020   NA 142 05/21/2021   K 4.5  05/21/2021   CL 102 05/21/2021   CO2 35 (H) 05/21/2021   GLUCOSE 109 (H) 05/21/2021   BUN 52 (H) 05/21/2021   CREATININE 1.55 (H) 05/21/2021   BILITOT 0.2 (L) 09/04/2011   ALKPHOS 58 09/04/2011   AST 14 09/04/2011   ALT 17 09/04/2011   PROT 7.2 09/04/2011   ALBUMIN 3.8 09/04/2011   CALCIUM 9.4 05/21/2021   GFRAA >60 06/30/2016    Speciality Comments: No specialty comments available.  Procedures:  No procedures performed Allergies: Exenatide, Lisinopril, Penicillins, Canagliflozin, and Metformin   Assessment / Plan:     Visit Diagnoses: No diagnosis found.  Orders: No orders of the defined types were placed in this encounter.  No orders of the defined types were placed in this encounter.   Face-to-face time spent with patient was *** minutes. Greater than 50% of time was spent in counseling and coordination of care.  Follow-Up Instructions: No follow-ups on file.   Collier Salina, MD  Note - This record has been created using [employer].  Chart creation errors have been sought, but may not always  have been located. Such creation errors do not reflect on  the standard of medical care.

## 2021-07-07 ENCOUNTER — Encounter: Payer: Self-pay | Admitting: Internal Medicine

## 2021-07-07 ENCOUNTER — Telehealth: Payer: Self-pay | Admitting: Pharmacist

## 2021-07-07 ENCOUNTER — Ambulatory Visit: Payer: Medicaid Other | Admitting: Internal Medicine

## 2021-07-07 VITALS — BP 146/79 | HR 85 | Resp 15 | Ht 62.0 in | Wt 209.0 lb

## 2021-07-07 DIAGNOSIS — L409 Psoriasis, unspecified: Secondary | ICD-10-CM

## 2021-07-07 DIAGNOSIS — L4050 Arthropathic psoriasis, unspecified: Secondary | ICD-10-CM | POA: Diagnosis not present

## 2021-07-07 DIAGNOSIS — N1832 Chronic kidney disease, stage 3b: Secondary | ICD-10-CM | POA: Diagnosis not present

## 2021-07-07 DIAGNOSIS — Q6470 Unspecified congenital malformation of bladder and urethra: Secondary | ICD-10-CM | POA: Insufficient documentation

## 2021-07-07 DIAGNOSIS — R262 Difficulty in walking, not elsewhere classified: Secondary | ICD-10-CM | POA: Insufficient documentation

## 2021-07-07 DIAGNOSIS — M349 Systemic sclerosis, unspecified: Secondary | ICD-10-CM

## 2021-07-07 DIAGNOSIS — Z79899 Other long term (current) drug therapy: Secondary | ICD-10-CM | POA: Diagnosis not present

## 2021-07-07 NOTE — Patient Instructions (Signed)
Adalimumab Injection What is this medication? ADALIMUMAB (ay da LIM yoo mab) is used to treat rheumatoid and psoriatic arthritis. It is also used to treat ankylosing spondylitis, Crohn's disease, ulcerative colitis, plaque psoriasis, hidradenitis suppurativa, and uveitis. This medicine may be used for other purposes; ask your health care provider or pharmacist if you have questions. COMMON BRAND NAME(S): AMJEVITA, Humira What should I tell my care team before I take this medication? They need to know if you have any of these conditions: cancer diabetes (high blood sugar) having surgery heart disease hepatitis B immune system problems infections, such as tuberculosis (TB) or other bacterial, fungal, or viral infections multiple sclerosis recent or upcoming vaccine an unusual reaction to adalimumab, mannitol, latex, rubber, other medicines, foods, dyes, or preservatives pregnant or trying to get pregnant breast-feeding How should I use this medication? This medicine is for injection under the skin. You will be taught how to prepare and give it. Take it as directed on the prescription label. Keep taking it unless your health care provider tells you to stop. It is important that you put your used needles and syringes in a special sharps container. Do not put them in a trash can. If you do not have a sharps container, call your pharmacist or health care provider to get one. This medicine comes with INSTRUCTIONS FOR USE. Ask your pharmacist for directions on how to use this medicine. Read the information carefully. Talk to your pharmacist or health care provider if you have questions. A special MedGuide will be given to you by the pharmacist with each prescription and refill. Be sure to read this information carefully each time. Talk to your pediatrician regarding the use of this medicine in children. While this drug may be prescribed for children as young as 2 years for selected conditions,  precautions do apply. Overdosage: If you think you have taken too much of this medicine contact a poison control center or emergency room at once. NOTE: This medicine is only for you. Do not share this medicine with others. What if I miss a dose? If you miss a dose, take it as soon as you can. If it is almost time for your next dose, take only that dose. Do not take double or extra doses. It is important not to miss any doses. Talk to your health care provider about what to do if you miss a dose. What may interact with this medication? Do not take this medicine with any of the following medications: abatacept anakinra biologic medicines such as certolizumab, etanercept, golimumab, infliximab live virus vaccines This medicine may also interact with the following medications: cyclosporine theophylline vaccines warfarin This list may not describe all possible interactions. Give your health care provider a list of all the medicines, herbs, non-prescription drugs, or dietary supplements you use. Also tell them if you smoke, drink alcohol, or use illegal drugs. Some items may interact with your medicine. What should I watch for while using this medication? Visit your health care provider for regular checks on your progress. Tell your health care provider if your symptoms do not start to get better or if they get worse. You will be tested for tuberculosis (TB) before you start this medicine. If your doctor prescribes any medicine for TB, you should start taking the TB medicine before starting this medicine. Make sure to finish the full course of TB medicine. This medicine may increase your risk of getting an infection. Call your health care provider for advice if you  get a fever, chills, sore throat, or other symptoms of a cold or flu. Do not treat yourself. Try to avoid being around people who are sick. Talk to your health care provider about your risk of cancer. You may be more at risk for certain  types of cancer if you take this medicine. What side effects may I notice from receiving this medication? Side effects that you should report to your doctor or health care professional as soon as possible: allergic reactions like skin rash, itching or hives, swelling of the face, lips, or tongue changes in vision chest pain dizziness heart failure (trouble breathing; fast, irregular heartbeat; sudden weight gain; swelling of the ankles, feet, hands; unusually weak or tired) infection (fever, chills, cough, sore throat, pain or trouble passing urine) liver injury (dark yellow or brown urine; general ill feeling or flu-like symptoms; loss of appetite, right upper belly pain; unusually weak or tired, yellowing of the eyes or skin) lump or swollen lymph nodes on the neck, groin, or underarm area muscle weakness pain, tingling, numbness in the hands or feet red, scaly patches or raised bumps on the skin trouble breathing unusual bleeding or bruising unusually weak or tired Side effects that usually do not require medical attention (report to your doctor or health care professional if they continue or are bothersome): headache nausea pain, redness, or irritation at site where injected stuffy or runny nose This list may not describe all possible side effects. Call your doctor for medical advice about side effects. You may report side effects to FDA at 1-800-FDA-1088. Where should I keep my medication? Keep out of the reach of children and pets. Store in the refrigerator between 2 and 8 degrees C (36 and 46 degrees F). Do not freeze. Keep this medicine in the original packaging until you are ready to take it. Protect from light. Get rid of any unused medicine after the expiration date. This medicine may be stored at room temperature for up to 14 days. Keep this medicine in the original packaging. Protect from light. If it is stored at room temperature, get rid of any unused medicine after 14 days  or after it expires, whichever is first. To get rid of medicines that are no longer needed or have expired: Take the medicine to a medicine take-back program. Check with your pharmacy or law enforcement to find a location. If you cannot return the medicine, ask your pharmacist or health care provider how to get rid of this medicine safely. NOTE: This sheet is a summary. It may not cover all possible information. If you have questions about this medicine, talk to your doctor, pharmacist, or health care provider.  2023 Elsevier/Gold Standard (2020-12-25 00:00:00)

## 2021-07-07 NOTE — Telephone Encounter (Addendum)
Please start Humira BIV. Pending OV notes from 07/07/21 to be signed and baseline high-risk med labs to result  Dose: '40mg'$  SQ every 14 days  Dx: Psoriatic arthritis (L40.5) and Psoriasis (L40.9)  Previously tried therapies: Humira - had clinical response but stopped due to no f/u Cosentyx - inadequate clinical response MTX - increase in LFTs Otezla - caused headaches  ----- Message from Shona Needles, RT sent at 07/07/2021 10:56 AM EDT ----- Regarding: NEW START HUMIRA New start Humira. Thank you.

## 2021-07-08 ENCOUNTER — Other Ambulatory Visit (HOSPITAL_COMMUNITY): Payer: Self-pay

## 2021-07-08 NOTE — Telephone Encounter (Signed)
Submitted a Prior Authorization request to  [facility]  for Gisela via CoverMyMeds. Will update once we receive a response.   Key: [id number] - [place] Case ID: [id number]

## 2021-07-08 NOTE — Telephone Encounter (Signed)
Awaiting immunosuppressive labs to result prior to patient restarting Humira.  Knox Saliva, PharmD, MPH, BCPS, CPP Clinical Pharmacist (Rheumatology and Pulmonology)

## 2021-07-08 NOTE — Telephone Encounter (Signed)
Received notification from  [facility]  regarding a prior authorization for HUMIRA '40mg'$  CF. Authorization has been APPROVED from 07/08/21 to 07/08/22.   Per test claim, copay for 28 days supply is $4.00. Message below was in test claim:   Patient can fill through [place]: [phone]   Authorization # Key: [id number] - [place] Case ID: [id number]

## 2021-07-10 LAB — COMPLETE METABOLIC PANEL WITH GFR
AG Ratio: 1.5 (calc) (ref 1.0–2.5)
ALT: 15 U/L (ref 6–29)
AST: 14 U/L (ref 10–35)
Albumin: 4.6 g/dL (ref 3.6–5.1)
Alkaline phosphatase (APISO): 81 U/L (ref 37–153)
BUN/Creatinine Ratio: 29 (calc) — ABNORMAL HIGH (ref 6–22)
BUN: 57 mg/dL — ABNORMAL HIGH (ref 7–25)
CO2: 31 mmol/L (ref 20–32)
Calcium: 10.3 mg/dL (ref 8.6–10.4)
Chloride: 103 mmol/L (ref 98–110)
Creat: 1.97 mg/dL — ABNORMAL HIGH (ref 0.50–1.03)
Globulin: 3 g/dL (calc) (ref 1.9–3.7)
Glucose, Bld: 226 mg/dL — ABNORMAL HIGH (ref 65–99)
Potassium: 4.4 mmol/L (ref 3.5–5.3)
Sodium: 145 mmol/L (ref 135–146)
Total Bilirubin: 0.3 mg/dL (ref 0.2–1.2)
Total Protein: 7.6 g/dL (ref 6.1–8.1)
eGFR: 30 mL/min/{1.73_m2} — ABNORMAL LOW (ref >=60)

## 2021-07-10 LAB — CBC WITH DIFFERENTIAL/PLATELET
Absolute Monocytes: 390 cells/uL (ref 200–950)
Basophils Absolute: 33 cells/uL (ref 0–200)
Basophils Relative: 0.5 %
Eosinophils Absolute: 111 cells/uL (ref 15–500)
Eosinophils Relative: 1.7 %
HCT: 39.5 % (ref 35.0–45.0)
Hemoglobin: 13.5 g/dL (ref 11.7–15.5)
Lymphs Abs: 1736 cells/uL (ref 850–3900)
MCH: 28.6 pg (ref 27.0–33.0)
MCHC: 34.2 g/dL (ref 32.0–36.0)
MCV: 83.7 fL (ref 80.0–100.0)
MPV: 10.6 fL (ref 7.5–12.5)
Monocytes Relative: 6 %
Neutro Abs: 4232 cells/uL (ref 1500–7800)
Neutrophils Relative %: 65.1 %
Platelets: 239 10*3/uL (ref 140–400)
RBC: 4.72 10*6/uL (ref 3.80–5.10)
RDW: 13.3 % (ref 11.0–15.0)
Total Lymphocyte: 26.7 %
WBC: 6.5 10*3/uL (ref 3.8–10.8)

## 2021-07-10 LAB — QUANTIFERON-TB GOLD PLUS
Mitogen-NIL: 5.37 IU/mL
NIL: 0.05 IU/mL
QuantiFERON-TB Gold Plus: NEGATIVE
TB1-NIL: 0 IU/mL
TB2-NIL: 0 IU/mL

## 2021-07-10 LAB — HEPATITIS C ANTIBODY
Hepatitis C Ab: NONREACTIVE
SIGNAL TO CUT-OFF: 0.06 (ref <1.00)

## 2021-07-10 LAB — HIV ANTIBODY (ROUTINE TESTING W REFLEX): HIV 1&2 Ab, 4th Generation: NONREACTIVE

## 2021-07-10 LAB — HEPATITIS B CORE ANTIBODY, IGM: Hep B C IgM: NONREACTIVE

## 2021-07-10 LAB — HEPATITIS B SURFACE ANTIGEN: Hepatitis B Surface Ag: NONREACTIVE

## 2021-07-12 ENCOUNTER — Other Ambulatory Visit (HOSPITAL_COMMUNITY): Payer: Self-pay

## 2021-07-12 MED ORDER — HUMIRA (2 PEN) 40 MG/0.4ML ~~LOC~~ AJKT
40.0000 mg | AUTO-INJECTOR | SUBCUTANEOUS | 0 refills | Status: DC
  Filled 2021-07-12: qty 6, 84d supply, fill #0
  Filled 2021-07-12: qty 6, fill #0

## 2021-07-12 NOTE — Telephone Encounter (Signed)
Rx for Humira sent to [facility]. CC information collect and provided to Elliott, CPhT. Patient confirmed that address on file is correct for shipping medication.  Patient requesting call when shipment has left pharmacy and when it is expected to arrive to home.  Knox Saliva, PharmD, MPH, BCPS, CPP Clinical Pharmacist (Rheumatology and Pulmonology)

## 2021-07-12 NOTE — Progress Notes (Signed)
Lab results look fine for starting Humira as planned. Her kidney function numbers are slightly worse than before. Is she taking any NSAIDs like ibuprofen or aleve? These could be a contributor if so. They are not terrible, we can plan to just recheck when we follow up.

## 2021-07-12 NOTE — Telephone Encounter (Signed)
Delivery instructions have been updated in [place], medication will be shipped to patient's home address by 07/14/21 as an 84-day supply.  Rx has been processed in [facility] and there is a copay of $4.00. Note left in both [facility] and [facility] stating that pt wishes to be called once medication has been shipped from pharmacy and that a detailed VM can be left if needed.

## 2021-07-13 ENCOUNTER — Other Ambulatory Visit (HOSPITAL_COMMUNITY): Payer: Self-pay

## 2021-07-14 ENCOUNTER — Telehealth: Payer: Self-pay | Admitting: Internal Medicine

## 2021-07-14 NOTE — Telephone Encounter (Signed)
Patient called stating she just received her shipment of Humira and wanted to confirm that she takes 1 injection every 2 weeks.  Patient requested a return call.

## 2021-07-14 NOTE — Telephone Encounter (Addendum)
Returned call to patient. Confirmed that she is to inject Humira pen into thigh or lower abdomen every 2 weeks. She received two pens for a month supply.  She states she just received a call stating her father has passed away. Offered condolences.  Knox Saliva, PharmD, MPH, BCPS, CPP Clinical Pharmacist (Rheumatology and Pulmonology)

## 2021-07-30 ENCOUNTER — Ambulatory Visit: Payer: Medicaid Other | Admitting: Internal Medicine
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
## 2021-09-27 ENCOUNTER — Other Ambulatory Visit (HOSPITAL_COMMUNITY): Payer: Self-pay

## 2021-09-27 ENCOUNTER — Other Ambulatory Visit: Payer: Self-pay | Admitting: Internal Medicine

## 2021-09-27 DIAGNOSIS — L409 Psoriasis, unspecified: Secondary | ICD-10-CM

## 2021-09-27 DIAGNOSIS — Z79899 Other long term (current) drug therapy: Secondary | ICD-10-CM

## 2021-09-27 DIAGNOSIS — L4050 Arthropathic psoriasis, unspecified: Secondary | ICD-10-CM

## 2021-09-27 MED ORDER — HUMIRA (2 PEN) 40 MG/0.4ML ~~LOC~~ AJKT
40.0000 mg | AUTO-INJECTOR | SUBCUTANEOUS | 0 refills | Status: DC
  Filled 2021-09-27: qty 2, 28d supply, fill #0
  Filled 2021-10-18: qty 2, 28d supply, fill #1
  Filled 2021-11-15: qty 2, 28d supply, fill #2

## 2021-09-27 NOTE — Telephone Encounter (Signed)
Next Visit: 8/2//2023  Last Visit: 07/07/2021  Last Fill: 07/12/2021  [id number] arthritis   Current Dose per office note 07/07/2021: 40 mg [place] q14days  Labs: 07/07/2021  CMP WNL except BUN 57 HIGH, Creatinine 1.97 HIGH, eGFR 30 LOW, and BUN/Creatinine Ratio 29 HIGH  CBC WNL  TB Gold: 07/07/2021 Negative   Okay to refill Humira?

## 2021-09-28 ENCOUNTER — Other Ambulatory Visit (HOSPITAL_COMMUNITY): Payer: Self-pay

## 2021-09-28 ENCOUNTER — Ambulatory Visit: Payer: Medicaid Other | Attending: Internal Medicine | Admitting: Internal Medicine

## 2021-09-28 DIAGNOSIS — L4050 Arthropathic psoriasis, unspecified: Secondary | ICD-10-CM

## 2021-09-28 DIAGNOSIS — M349 Systemic sclerosis, unspecified: Secondary | ICD-10-CM

## 2021-09-28 DIAGNOSIS — N1832 Chronic kidney disease, stage 3b: Secondary | ICD-10-CM

## 2021-09-28 DIAGNOSIS — Z79899 Other long term (current) drug therapy: Secondary | ICD-10-CM
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
## 2021-10-01 ENCOUNTER — Ambulatory Visit: Payer: Medicaid Other | Attending: Internal Medicine | Admitting: Internal Medicine

## 2021-10-01 DIAGNOSIS — M349 Systemic sclerosis, unspecified: Secondary | ICD-10-CM

## 2021-10-01 DIAGNOSIS — L4050 Arthropathic psoriasis, unspecified: Secondary | ICD-10-CM

## 2021-10-01 DIAGNOSIS — Z79899 Other long term (current) drug therapy: Secondary | ICD-10-CM

## 2021-10-01 DIAGNOSIS — L409 Psoriasis, unspecified: Secondary | ICD-10-CM

## 2021-10-01 DIAGNOSIS — N1832 Chronic kidney disease, stage 3b: Secondary | ICD-10-CM

## 2021-10-08 ENCOUNTER — Other Ambulatory Visit (HOSPITAL_COMMUNITY): Payer: Self-pay

## 2021-10-14 ENCOUNTER — Other Ambulatory Visit (HOSPITAL_COMMUNITY): Payer: Self-pay
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
## 2021-10-18 ENCOUNTER — Other Ambulatory Visit (HOSPITAL_COMMUNITY): Payer: Self-pay

## 2021-10-20 ENCOUNTER — Encounter: Payer: Self-pay | Admitting: Internal Medicine

## 2021-10-20 ENCOUNTER — Ambulatory Visit: Payer: Medicaid Other | Attending: Internal Medicine | Admitting: Internal Medicine

## 2021-10-20 VITALS — BP 133/80 | HR 66 | Ht 62.0 in | Wt 208.0 lb

## 2021-10-20 DIAGNOSIS — L4050 Arthropathic psoriasis, unspecified: Secondary | ICD-10-CM | POA: Diagnosis not present

## 2021-10-20 DIAGNOSIS — N1832 Chronic kidney disease, stage 3b: Secondary | ICD-10-CM

## 2021-10-20 DIAGNOSIS — L409 Psoriasis, unspecified: Secondary | ICD-10-CM

## 2021-10-20 DIAGNOSIS — Z79899 Other long term (current) drug therapy: Secondary | ICD-10-CM

## 2021-10-20 DIAGNOSIS — M25512 Pain in left shoulder: Secondary | ICD-10-CM | POA: Diagnosis not present

## 2021-10-20 DIAGNOSIS — M349 Systemic sclerosis, unspecified: Secondary | ICD-10-CM

## 2021-10-21 LAB — COMPLETE METABOLIC PANEL WITH GFR
AG Ratio: 1.6 (calc) (ref 1.0–2.5)
ALT: 15 U/L (ref 6–29)
AST: 14 U/L (ref 10–35)
Albumin: 4.5 g/dL (ref 3.6–5.1)
Alkaline phosphatase (APISO): 81 U/L (ref 37–153)
BUN/Creatinine Ratio: 20 (calc) (ref 6–22)
BUN: 40 mg/dL — ABNORMAL HIGH (ref 7–25)
CO2: 29 mmol/L (ref 20–32)
Calcium: 10.1 mg/dL (ref 8.6–10.4)
Chloride: 101 mmol/L (ref 98–110)
Creat: 2.05 mg/dL — ABNORMAL HIGH (ref 0.50–1.03)
Globulin: 2.9 g/dL (calc) (ref 1.9–3.7)
Glucose, Bld: 214 mg/dL — ABNORMAL HIGH (ref 65–99)
Potassium: 4.7 mmol/L (ref 3.5–5.3)
Sodium: 141 mmol/L (ref 135–146)
Total Bilirubin: 0.4 mg/dL (ref 0.2–1.2)
Total Protein: 7.4 g/dL (ref 6.1–8.1)
eGFR: 29 mL/min/{1.73_m2} — ABNORMAL LOW (ref >=60)

## 2021-10-21 LAB — CBC WITH DIFFERENTIAL/PLATELET
Absolute Monocytes: 570 cells/uL (ref 200–950)
Basophils Absolute: 30 cells/uL (ref 0–200)
Basophils Relative: 0.4 %
Eosinophils Absolute: 113 cells/uL (ref 15–500)
Eosinophils Relative: 1.5 %
HCT: 41.7 % (ref 35.0–45.0)
Hemoglobin: 12.8 g/dL (ref 11.7–15.5)
Lymphs Abs: 3240 cells/uL (ref 850–3900)
MCH: 25.9 pg — ABNORMAL LOW (ref 27.0–33.0)
MCHC: 30.7 g/dL — ABNORMAL LOW (ref 32.0–36.0)
MCV: 84.2 fL (ref 80.0–100.0)
MPV: 11 fL (ref 7.5–12.5)
Monocytes Relative: 7.6 %
Neutro Abs: 3548 cells/uL (ref 1500–7800)
Neutrophils Relative %: 47.3 %
Platelets: 192 10*3/uL (ref 140–400)
RBC: 4.95 10*6/uL (ref 3.80–5.10)
RDW: 13.5 % (ref 11.0–15.0)
Total Lymphocyte: 43.2 %
WBC: 7.5 10*3/uL (ref 3.8–10.8)

## 2021-10-21 LAB — SEDIMENTATION RATE: Sed Rate: 6 mm/h (ref 0–30)

## 2021-10-21 NOTE — Progress Notes (Signed)
Kidney function test is about the same as before not normal but no worse. Results all look fine for continuing the Humira.

## 2021-10-23 MED ORDER — TRIAMCINOLONE ACETONIDE 40 MG/ML IJ SUSP
40.0000 mg | INTRAMUSCULAR | Status: AC | PRN
  Administered 2021-10-20: 40 mg via INTRA_ARTICULAR

## 2021-10-23 MED ORDER — LIDOCAINE HCL 1 % IJ SOLN
3.0000 mL | INTRAMUSCULAR | Status: AC | PRN
  Administered 2021-10-20: 3 mL

## 2021-11-03 ENCOUNTER — Ambulatory Visit (INDEPENDENT_AMBULATORY_CARE_PROVIDER_SITE_OTHER): Payer: Medicaid Other

## 2021-11-03 ENCOUNTER — Ambulatory Visit: Payer: Self-pay

## 2021-11-03 ENCOUNTER — Encounter: Payer: Self-pay | Admitting: Orthopaedic Surgery

## 2021-11-03 ENCOUNTER — Other Ambulatory Visit: Payer: Self-pay | Admitting: Physician Assistant

## 2021-11-03 ENCOUNTER — Ambulatory Visit: Payer: Medicaid Other | Admitting: Orthopaedic Surgery

## 2021-11-03 DIAGNOSIS — M25561 Pain in right knee: Secondary | ICD-10-CM

## 2021-11-03 DIAGNOSIS — M25562 Pain in left knee: Secondary | ICD-10-CM

## 2021-11-03 DIAGNOSIS — L4050 Arthropathic psoriasis, unspecified: Secondary | ICD-10-CM | POA: Diagnosis not present

## 2021-11-03 DIAGNOSIS — M65332 Trigger finger, left middle finger: Secondary | ICD-10-CM | POA: Diagnosis not present

## 2021-11-03 DIAGNOSIS — E1142 Type 2 diabetes mellitus with diabetic polyneuropathy: Secondary | ICD-10-CM | POA: Diagnosis not present

## 2021-11-03 DIAGNOSIS — G8929 Other chronic pain: Secondary | ICD-10-CM

## 2021-11-03 DIAGNOSIS — M25512 Pain in left shoulder: Secondary | ICD-10-CM

## 2021-11-03 MED ORDER — METHYLPREDNISOLONE ACETATE 40 MG/ML IJ SUSP
10.0000 mg | INTRAMUSCULAR | Status: AC | PRN
  Administered 2021-11-03: 10 mg

## 2021-11-03 MED ORDER — LIDOCAINE HCL 1 % IJ SOLN
0.5000 mL | INTRAMUSCULAR | Status: AC | PRN
  Administered 2021-11-03: .5 mL

## 2021-11-03 MED ORDER — DIAZEPAM 10 MG PO TABS: 10.0000 mg | ORAL_TABLET | Freq: Once | ORAL | 0 refills | Status: AC
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
## 2021-11-15 ENCOUNTER — Other Ambulatory Visit (HOSPITAL_COMMUNITY): Payer: Self-pay

## 2021-11-16 ENCOUNTER — Other Ambulatory Visit: Payer: Self-pay | Admitting: Orthopaedic Surgery

## 2021-11-16 DIAGNOSIS — G8929 Other chronic pain: Secondary | ICD-10-CM

## 2021-11-17 ENCOUNTER — Other Ambulatory Visit (HOSPITAL_COMMUNITY): Payer: Self-pay

## 2021-11-21 ENCOUNTER — Other Ambulatory Visit: Payer: Medicaid Other

## 2021-11-24 NOTE — Progress Notes (Deleted)
Name: MULAN ADAN  MRN/ DOB: [id number], 02-18-1970   Age/ Sex: 51 y.o., female    PCP: Dianna Rossetti, NP   Reason for Endocrinology Evaluation: Type 2 Diabetes Mellitus     Date of Initial Endocrinology Visit: 08/03/2020    PATIENT IDENTIFIER: Ms. LIZMARIE WITTERS is a 51 y.o. female with a past medical history of T2DM and HN with hx of Pancreatitis ( while living in [place] ) . The patient presented for initial endocrinology clinic visit on 08/03/2020 for consultative assistance with her diabetes management.    HPI: Ms. Nienow was   Diagnosed with DM at age 19 started with gestational diabetes  Prior Medications tried/Intolerance: was on Trulicity in 6761 , intolerant to metformin Hemoglobin A1c has ranged from 9.1% in 04/2020, peaking at 13.5% in 2018.  Cushingoid Features: She has been noted with buffalo hump and elevated urinary cortisol at 59.4 mcg/24 in 05/2020 Salivary cortisol came back abnormal at 0.21 mcg/dL at 11 pm ( 0-0.09) but the second sample was normal at 0.05 mcg/dL .  Repeat 24-hour urinary cortisol was normal at 9.6 mcg on 08/20/2020     SUBJECTIVE:   During the last visit (05/21/2021): A1c 7.7%   Today (11/24/21): Ms. Biagini is here for follow-up on diabetes management. She checks her blood sugars multiple times daily through CGM. The patient has  had hypoglycemic episodes since the last clinic visit, which typically occur 3 x / week - most often occuring middle of the night . The patient is  symptomatic with these episodes, with symptoms.      HOME DIABETES REGIMEN: Farxiga 10 mg daily  Lantus 70 units TWIce daily Novolog 20 units twice daily  Correction factor: NovoLog (BG -130/20)     Statin: yes ACE-I/ARB: yes Prior Diabetic Education: no     CONTINUOUS GLUCOSE MONITORING RECORD INTERPRETATION    Dates of Recording: 4/1 - 05/21/2021  Sensor description: Dexcom  Results statistics:   CGM use % of time 100  Average and SD 140/48  Time  in range      78%  % Time Above 180 17  % Time above 250 2  % Time Below target <1    Glycemic patterns summary: BG's are optimal overnight with hyperglycemia noted mainly post dinner  Hyperglycemic episodes  postprandial   Hypoglycemic episodes occurred during night and day  Overnight periods: trends down       DIABETIC COMPLICATIONS: Microvascular complications:  Neuropathy Denies: CKD, retinopathy  Last eye exam: Completed 2022  Macrovascular complications:   Denies: CAD, PVD, CVA   PAST HISTORY: Past Medical History:  Past Medical History:  Diagnosis Date   Diabetes mellitus    Hypertension    Neuropathy in diabetes ([place])    Osteoarthritis    Scoliosis    Past Surgical History:  Past Surgical History:  Procedure Laterality Date   BACK SURGERY  2014   CESAREAN SECTION     TUBAL LIGATION      Social History:  reports that she has never smoked. She has never been exposed to tobacco smoke. She has never used smokeless tobacco. She reports that she does not drink alcohol and does not use drugs. Family History:  Family History  Problem Relation Age of Onset   Diabetes Mother    Diabetes Father      HOME MEDICATIONS: Allergies as of 11/25/2021       Reactions   Exenatide Nausea And Vomiting, Other (See Comments)   Pancreatitis history  Lisinopril Other (See Comments)   Cough, Pancreatitis   Penicillins Itching, Rash, Hives   Has patient had a PCN reaction causing immediate rash, facial/tongue/throat swelling, SOB or lightheadedness with hypotension: Yes Has patient had a PCN reaction causing severe rash involving mucus membranes or skin necrosis: Yes Has patient had a PCN reaction that required hospitalization: Yes Has patient had a PCN reaction occurring within the last 10 years: No If all of the above answers are "NO", then may proceed with Cephalosporin use. Has patient had a PCN reaction causing immediate rash, facial/tongue/throat swelling, SOB  or lightheadedness with hypotension: Yes Has patient had a PCN reaction causing severe rash involving mucus membranes or skin necrosis: Yes Has patient had a PCN reaction that required hospitalization: Yes Has patient had a PCN reaction occurring within the last 10 years: No If all of the above answers are "NO", then may proceed with Cephalosporin use. itching   Canagliflozin Other (See Comments)   Yeast infections   Metformin Diarrhea, Nausea And Vomiting   Nausea and vomiting-caused  hospitalization        Medication List        Accurate as of November 24, 2021  7:20 AM. If you have any questions, ask your nurse or doctor.          acyclovir 800 MG tablet Commonly known as: ZOVIRAX Take 800 mg by mouth 5 (five) times daily.   albuterol 108 (90 Base) MCG/ACT inhaler Commonly known as: VENTOLIN HFA Inhale 2 puffs into the lungs every 6 (six) hours as needed for wheezing.   ALLERGY MED PO Take 1 tablet by mouth daily as needed (allergies).   B-D UF III MINI PEN NEEDLES 31G X 5 MM Misc Generic drug: Insulin Pen Needle 1 Device by Does not apply route in the morning, at noon, in the evening, and at bedtime.   betamethasone dipropionate 0.05 % cream Apply topically.   calcipotriene 0.005 % cream Commonly known as: DOVONOX Apply topically.   dapagliflozin propanediol 10 MG Tabs tablet Commonly known as: FARXIGA Take 1 tablet (10 mg total) by mouth daily.   Dexcom G6 Sensor Misc SMARTSIG:Topical Every 10 Days   Dexcom G6 Transmitter Misc USE TO MONITOR GLUCOSE   diclofenac Sodium 1 % Gel Commonly known as: VOLTAREN Apply 2-4 g topically 3 (three) times daily.   gabapentin 300 MG capsule Commonly known as: NEURONTIN Take 600 mg by mouth 3 (three) times daily.   Humira Pen 40 MG/0.4ML Pnkt Generic drug: Adalimumab Inject 40 mg into the skin every 14 (fourteen) days. 1 kit - 2 pens   HYDROcodone-acetaminophen 10-325 MG tablet Commonly known as: NORCO Take 1  tablet by mouth every 6 (six) hours as needed for moderate pain.   Lantus SoloStar 100 UNIT/ML Solostar Pen Generic drug: insulin glargine Inject 66 Units into the skin in the morning and at bedtime.   losartan 100 MG tablet Commonly known as: COZAAR Take 1 tablet by mouth daily.   meloxicam 15 MG tablet Commonly known as: MOBIC Take 15 mg by mouth daily.   methocarbamol 750 MG tablet Commonly known as: ROBAXIN Take 750 mg by mouth 3 (three) times daily as needed for muscle spasms.   metoprolol succinate 50 MG 24 hr tablet Commonly known as: TOPROL-XL Take 100 mg by mouth daily.   naloxone 4 MG/0.1ML Liqd nasal spray kit Commonly known as: NARCAN Place 4 mg into the nose as needed.   NovoLOG FlexPen 100 UNIT/ML FlexPen Generic drug: insulin  aspart Max daily 100 units   omeprazole 40 MG capsule Commonly known as: PRILOSEC Take 40 mg by mouth daily.   Omnipod 5 G6 Pod (Gen 5) Misc 1 Device by Does not apply route every other day.   Omnipod 5 G6 Intro (Gen 5) Kit 1 Device by Does not apply route every other day.   oxyCODONE-acetaminophen 7.5-325 MG tablet Commonly known as: PERCOCET Take 1 tablet by mouth every 4 (four) hours as needed for severe pain.   rosuvastatin 40 MG tablet Commonly known as: CRESTOR Take 40 mg by mouth daily.   spironolactone-hydrochlorothiazide 25-25 MG tablet Commonly known as: ALDACTAZIDE Take 1 tablet by mouth daily.   sucralfate 1 g tablet Commonly known as: CARAFATE Take 1 g by mouth 2 (two) times daily with a meal.   traMADol HCl 100 MG Tabs Take 2 tablets by mouth 2 (two) times daily.         ALLERGIES: Allergies  Allergen Reactions   Exenatide Nausea And Vomiting and Other (See Comments)    Pancreatitis history    Lisinopril Other (See Comments)    Cough, Pancreatitis    Penicillins Itching, Rash and Hives    Has patient had a PCN reaction causing immediate rash, facial/tongue/throat swelling, SOB or  lightheadedness with hypotension: Yes Has patient had a PCN reaction causing severe rash involving mucus membranes or skin necrosis: Yes Has patient had a PCN reaction that required hospitalization: Yes Has patient had a PCN reaction occurring within the last 10 years: No If all of the above answers are "NO", then may proceed with Cephalosporin use.  Has patient had a PCN reaction causing immediate rash, facial/tongue/throat swelling, SOB or lightheadedness with hypotension: Yes Has patient had a PCN reaction causing severe rash involving mucus membranes or skin necrosis: Yes Has patient had a PCN reaction that required hospitalization: Yes Has patient had a PCN reaction occurring within the last 10 years: No If all of the above answers are "NO", then may proceed with Cephalosporin use. itching    Canagliflozin Other (See Comments)    Yeast infections   Metformin Diarrhea and Nausea And Vomiting    Nausea and vomiting-caused  hospitalization        OBJECTIVE:   VITAL SIGNS: LMP 06/28/2016    PHYSICAL EXAM:  General: Pt appears well and is in NAD  Neck: General: Supple without adenopathy or carotid bruits. Thyroid: Thyroid size normal.  No goiter or nodules appreciated.   Lungs: Clear with good BS bilat with no rales, rhonchi, or wheezes  Heart: RRR   Abdomen: Normoactive bowel sounds, soft, nontender,   Extremities:  Lower extremities - trace edema with papular rash on shins   Neuro: MS is good with appropriate affect, pt is alert and Ox3    Dm Foot Exam 05/21/2021 The skin of the feet is intact without sores or ulcerations. The pedal pulses are 2+ on right and 2+ on left. The sensation is intact to a screening 5.07, 10 gram monofilament bilaterally  DATA REVIEWED:  Lab Results  Component Value Date   HGBA1C 7.7 (A) 05/21/2021   HGBA1C 8.4 (A) 08/03/2020   HGBA1C 13.5 (H) 06/30/2016   Lab Results  Component Value Date   MICROALBUR 28.8 (H) 05/21/2021   LDLCALC 76  05/21/2021   CREATININE 2.05 (H) 10/20/2021    Latest Reference Range & Units 05/21/21 14:39  Sodium 135 - 145 mEq/L 142  Potassium 3.5 - 5.1 mEq/L 4.5  Chloride 96 - 112 mEq/L  102  CO2 19 - 32 mEq/L 35 (H)  Glucose 70 - 99 mg/dL 109 (H)  BUN 6 - 23 mg/dL 52 (H)  Creatinine 0.40 - 1.20 mg/dL 1.55 (H)  Calcium 8.4 - 10.5 mg/dL 9.4  GFR >60.00 mL/min 38.41 (L)  Total CHOL/HDL Ratio  4  Cholesterol 0 - 200 mg/dL 144  HDL Cholesterol >39.00 mg/dL 34.60 (L)  LDL (calc) 0 - 99 mg/dL 76  MICROALB/CREAT RATIO 0.0 - 30.0 mg/g 38.7 (H)  NonHDL  109.07  Triglycerides 0.0 - 149.0 mg/dL 163.0 (H)  VLDL 0.0 - 40.0 mg/dL 32.6     ASSESSMENT / PLAN / RECOMMENDATIONS:   1) Type 2 Diabetes Mellitus, poorly controlled, With Neuropathic  complications - Most recent A1c of 7.7 %. Goal A1c < 7.0 %.    -Praised the patient improved glycemic control, A1c down from 8.4% to 7.7% -Unfortunately she continues with hypoglycemia, patient did not reduce basal insulin as previously advised -We emphasized the importance of reducing basal insulin to prevent hypoglycemia overnight She is intolerant to metformin Due to history of pancreatitis, GLP 1 agonist and DPP 4 inhibitors are CONTRAINDICATED I have encouraged the patient to use correction scale prior to each meal   MEDICATIONS: Continue Farxiga 10 mg daily Decrease Lantus to 70 units twice daily Continue NovoLog 20 units 3 times daily before every meal CF: NovoLog (BG -130/20)  EDUCATION / INSTRUCTIONS: BG monitoring instructions: Patient is instructed to check her blood sugars 3 times a day, before meals . Call [facility] if: BG persistently < 70  I reviewed the Rule of 15 for the treatment of hypoglycemia in detail with the patient. Literature supplied.   2) Diabetic complications:  Eye: Does not have known diabetic retinopathy.  Neuro/ Feet: Does  have known diabetic peripheral neuropathy. Renal: Patient does not have known  baseline CKD. She is on an ARB at present.   3) Dyslipidemia:   -LDL at goal  Medication Continue rosuvastatin 40 mg daily   Follow-up in 6 months     Signed electronically by: Mack Guise, MD  [facility]  [facility] [address] [place], [place] [address] Phone: [phone] FAX: [phone]   CC: Dianna Rossetti, [address] [address] Phone: [phone]  Fax: [phone]    Return to Endocrinology clinic as below: Future Appointments  Date Time Provider [place]  11/25/2021  9:30 AM Joclynn Lumb, Melanie Crazier, MD [facility] None  12/01/2021  1:40 PM Collier Salina, MD [facility] None  12/07/2021  3:30 PM GI[facility]315 DG [facility] RM 3 GI-315DG [facility]. WE  12/07/2021  4:00 PM GI-315 MR 1 GI-315MRI [facility]. WE

## 2021-11-25 ENCOUNTER — Ambulatory Visit: Payer: Self-pay | Admitting: Internal Medicine
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
## 2021-12-01 ENCOUNTER — Ambulatory Visit: Payer: Medicaid Other | Attending: Internal Medicine | Admitting: Internal Medicine

## 2021-12-01 DIAGNOSIS — M349 Systemic sclerosis, unspecified: Secondary | ICD-10-CM

## 2021-12-01 DIAGNOSIS — L4050 Arthropathic psoriasis, unspecified: Secondary | ICD-10-CM

## 2021-12-01 DIAGNOSIS — Z79899 Other long term (current) drug therapy: Secondary | ICD-10-CM

## 2021-12-01 DIAGNOSIS — N1832 Chronic kidney disease, stage 3b: Secondary | ICD-10-CM

## 2021-12-01 DIAGNOSIS — L409 Psoriasis, unspecified: Secondary | ICD-10-CM

## 2021-12-07 ENCOUNTER — Other Ambulatory Visit: Payer: Medicaid Other

## 2021-12-07 ENCOUNTER — Inpatient Hospital Stay: Admission: RE | Admit: 2021-12-07 | Payer: Medicaid Other | Source: Ambulatory Visit

## 2021-12-08 ENCOUNTER — Other Ambulatory Visit (HOSPITAL_COMMUNITY): Payer: Self-pay

## 2021-12-08 ENCOUNTER — Other Ambulatory Visit: Payer: Self-pay | Admitting: Internal Medicine

## 2021-12-08 DIAGNOSIS — L4050 Arthropathic psoriasis, unspecified: Secondary | ICD-10-CM

## 2021-12-08 DIAGNOSIS — L409 Psoriasis, unspecified: Secondary | ICD-10-CM

## 2021-12-08 DIAGNOSIS — Z79899 Other long term (current) drug therapy: Secondary | ICD-10-CM

## 2021-12-08 NOTE — Telephone Encounter (Addendum)
Next Visit: Due around 12/01/2021. Message sent to the front to schedule.  Last Visit: 10/20/2021  Last Fill: 09/27/2021  UG:AYGEFUWTK arthritis   Current Dose per office note 10/20/2021: Humira 40 mg [place] q14days   Labs: 10/20/2021 Kidney function test is about the same as before not normal but no worse. Results all look fine for continuing the Humira.   TB Gold: 07/07/2021 Neg  Okay to refill Humira?

## 2021-12-09 ENCOUNTER — Other Ambulatory Visit (HOSPITAL_COMMUNITY): Payer: Self-pay

## 2021-12-09 MED ORDER — HUMIRA (2 PEN) 40 MG/0.4ML ~~LOC~~ AJKT
40.0000 mg | AUTO-INJECTOR | SUBCUTANEOUS | 0 refills | Status: DC
  Filled 2021-12-09: qty 2, 28d supply, fill #0
  Filled 2022-01-10: qty 2, 28d supply, fill #1
  Filled 2022-02-02: qty 2, 28d supply, fill #2

## 2021-12-14 ENCOUNTER — Other Ambulatory Visit (HOSPITAL_COMMUNITY): Payer: Self-pay

## 2021-12-21 IMAGING — CT CT NECK W/ CM
4 series · 15 of 33 positions shown, 18 images · IV contrast (omnipaque)
Comparison: None.

CLINICAL DATA: Neck mass; technologist note states pain and
swelling to back of neck

EXAM:
CT NECK WITH CONTRAST
TECHNIQUE: Multidetector CT imaging of the neck was performed using the
standard protocol following the bolus administration of intravenous
contrast.
CONTRAST:  100mL OMNIPAQUE IOHEXOL 300 MG/ML  SOLN

[Series 3: axial neck · axial · 0.56mm/px · z∈[+1290,+1466]mm · 5 of 134 slices shown, 7 images]
[im 23/134  soft-tissue]
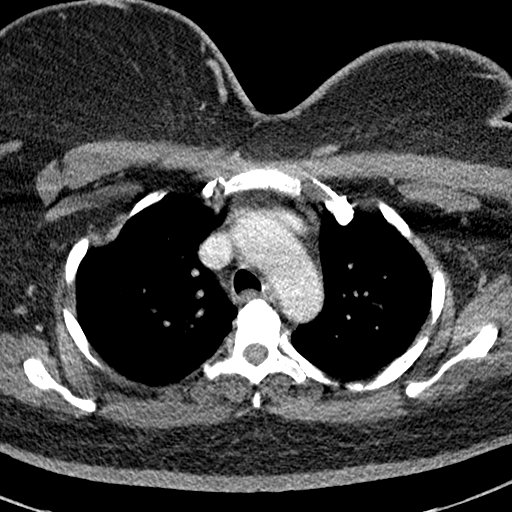
[im 23/134  bone]
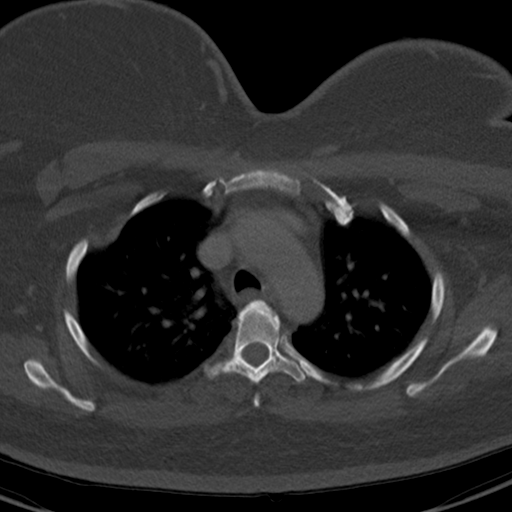
[im 45/134  bone]
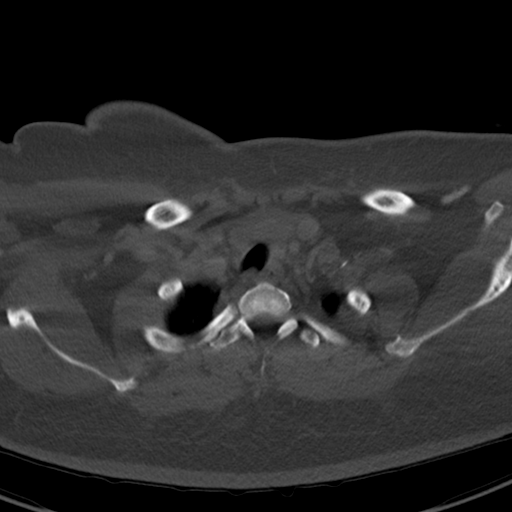
[im 67/134  bone]
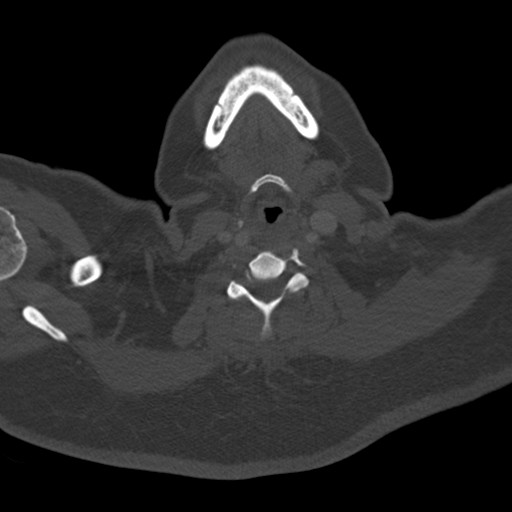
[im 89/134  bone]
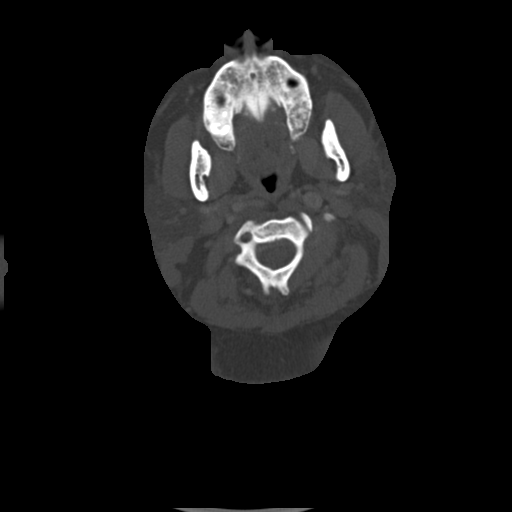
[im 111/134  soft-tissue]
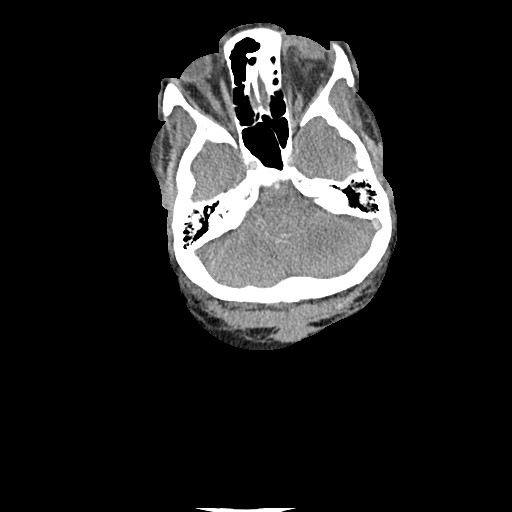
[im 111/134  bone]
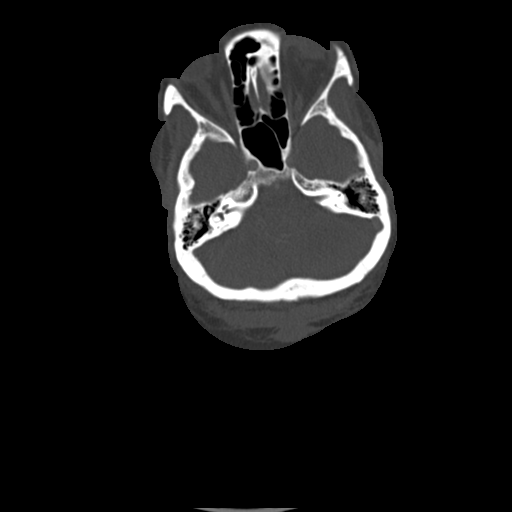

[Series 5: axial · axial · 0.40mm/px · z∈[+1295,+1339]mm · 2 of 130 slices shown]
[im 22/130  bone]
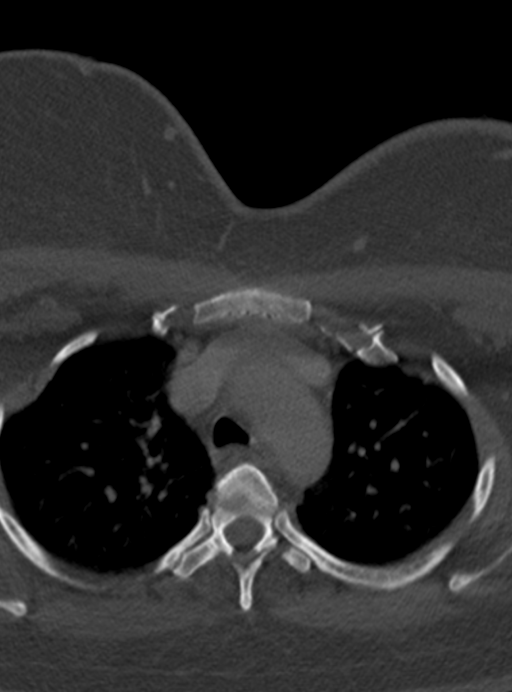
[im 44/130  bone]
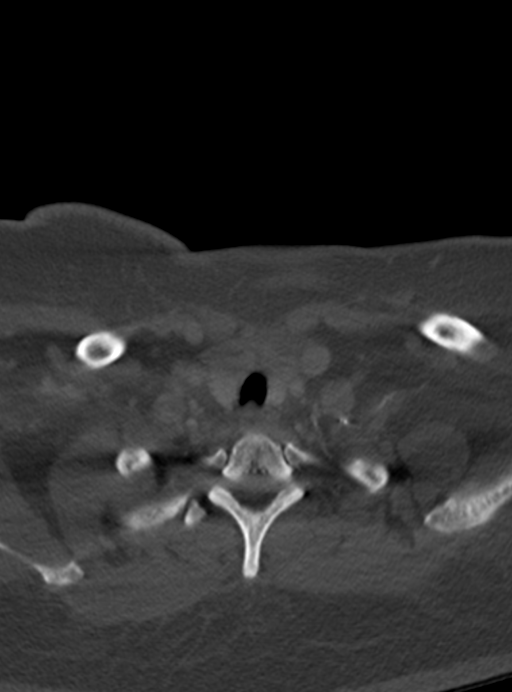

[Series 6: coronal · coronal · 0.44mm/px · 3 of 115 slices shown]
[im 23/115  bone]
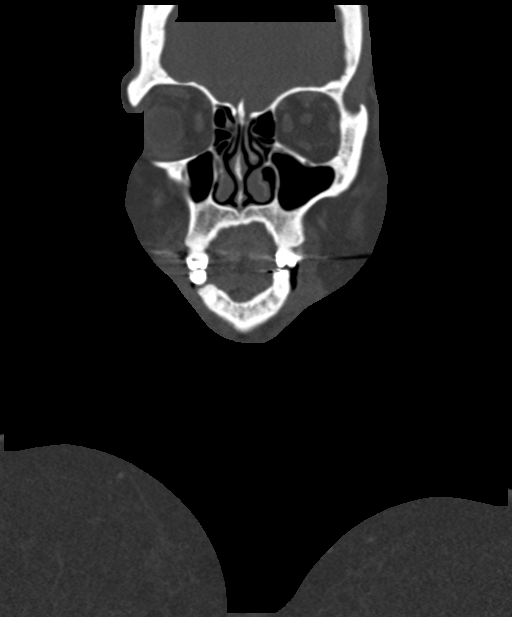
[im 46/115  bone]
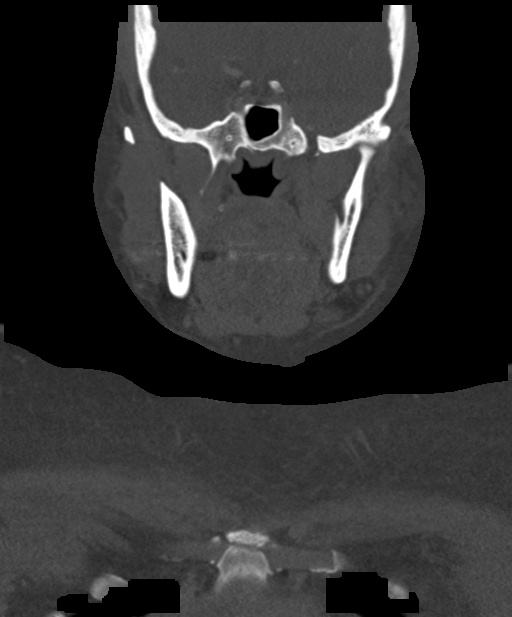
[im 69/115  bone]
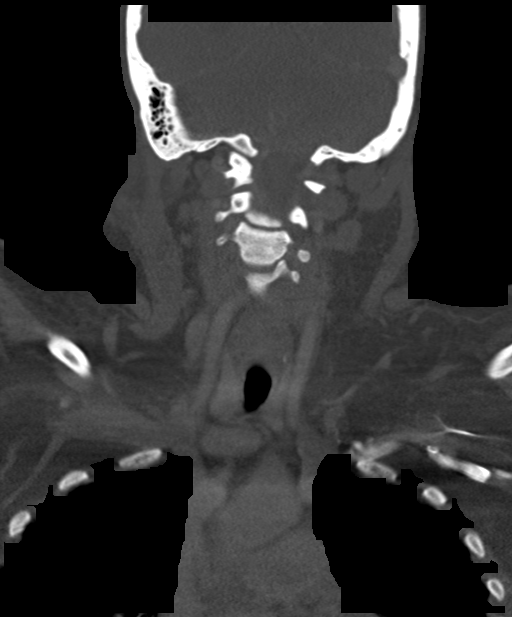

[Series 7: sagittal · sagittal · 0.53mm/px · 5 of 101 slices shown, 6 images]
[im 34/101  bone]
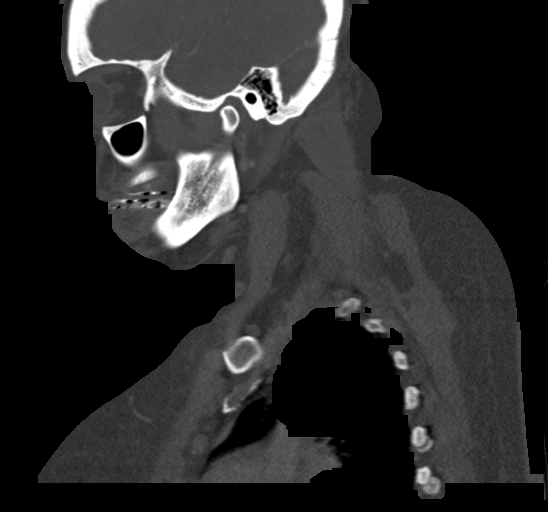
[im 42/101  bone]
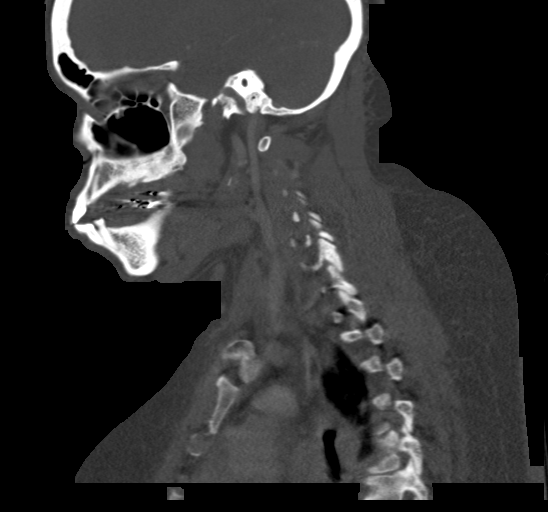
[im 51/101  soft-tissue]
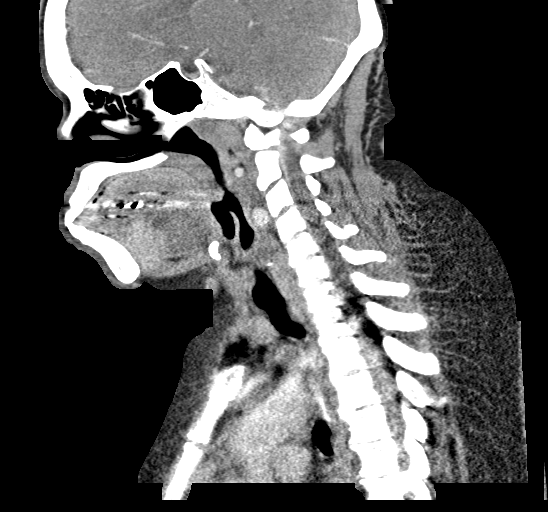
[im 51/101  bone]
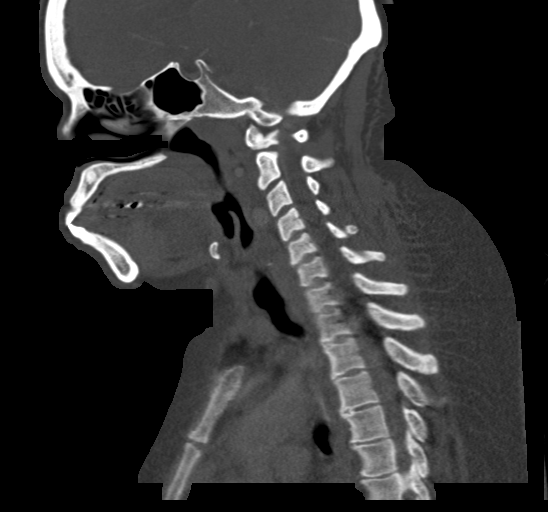
[im 59/101  bone]
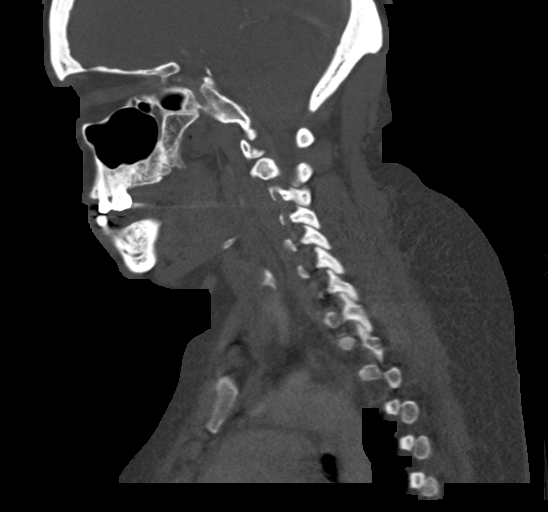
[im 67/101  bone]
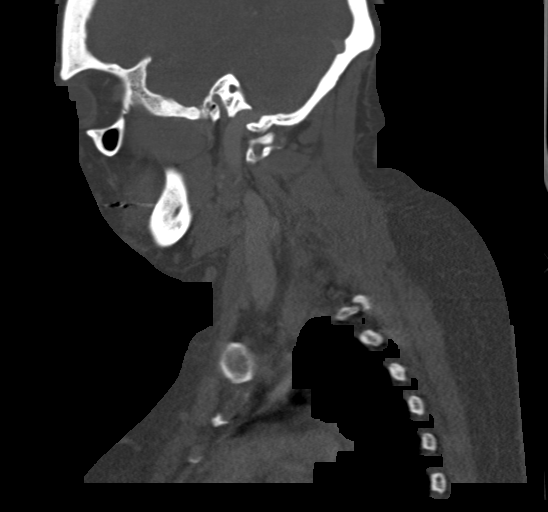

[15 of 33 positions shown; findings below may reference images not displayed]

FINDINGS: Pharynx and larynx: Unremarkable.  No mass or swelling.

Salivary glands: Unremarkable.

Thyroid: Normal.

Lymph nodes: No enlarged lymph nodes.

Vascular: Major neck vessels are patent.

Limited intracranial: No abnormal enhancement.

Visualized orbits: Unremarkable.

Mastoids and visualized paranasal sinuses: Aerated.

Skeleton: No significant abnormality

Upper chest: No apical lung mass.

Other: None.
IMPRESSION: No neck mass or adenopathy.

## 2021-12-27 ENCOUNTER — Inpatient Hospital Stay: Admission: RE | Admit: 2021-12-27 | Payer: Medicaid Other | Source: Ambulatory Visit

## 2021-12-28 ENCOUNTER — Other Ambulatory Visit: Payer: Self-pay | Admitting: Nurse Practitioner

## 2021-12-28 DIAGNOSIS — Z0000 Encounter for general adult medical examination without abnormal findings: Secondary | ICD-10-CM

## 2021-12-28 DIAGNOSIS — E2839 Other primary ovarian failure: Secondary | ICD-10-CM

## 2022-01-03 ENCOUNTER — Ambulatory Visit: Payer: Medicaid Other | Attending: Internal Medicine | Admitting: Internal Medicine

## 2022-01-03 ENCOUNTER — Encounter: Payer: Self-pay | Admitting: Internal Medicine

## 2022-01-03 VITALS — BP 94/59 | HR 66 | Resp 16 | Ht 62.0 in | Wt 212.0 lb

## 2022-01-03 DIAGNOSIS — L409 Psoriasis, unspecified: Secondary | ICD-10-CM

## 2022-01-03 DIAGNOSIS — M349 Systemic sclerosis, unspecified: Secondary | ICD-10-CM

## 2022-01-03 DIAGNOSIS — L4050 Arthropathic psoriasis, unspecified: Secondary | ICD-10-CM | POA: Diagnosis not present

## 2022-01-03 DIAGNOSIS — Z79899 Other long term (current) drug therapy: Secondary | ICD-10-CM

## 2022-01-03 DIAGNOSIS — N1832 Chronic kidney disease, stage 3b: Secondary | ICD-10-CM
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
## 2022-01-03 NOTE — Patient Instructions (Signed)
We are continuing Humira for your psoriasis and arthritis.  Your skin on the upper back has some thickening and hardness this is a problem called scleredema and related to long duration of diabetes. Keeping good control of your blood sugars is the most important treatment. If this causes worsening problems of itching or pain or rashes can consider seeing dermatology for additional treatment recommendations.

## 2022-01-04 LAB — COMPLETE METABOLIC PANEL WITH GFR
AG Ratio: 1.4 (calc) (ref 1.0–2.5)
ALT: 37 U/L — ABNORMAL HIGH (ref 6–29)
AST: 19 U/L (ref 10–35)
Albumin: 4.2 g/dL (ref 3.6–5.1)
Alkaline phosphatase (APISO): 71 U/L (ref 37–153)
BUN/Creatinine Ratio: 31 (calc) — ABNORMAL HIGH (ref 6–22)
BUN: 50 mg/dL — ABNORMAL HIGH (ref 7–25)
CO2: 31 mmol/L (ref 20–32)
Calcium: 9.7 mg/dL (ref 8.6–10.4)
Chloride: 107 mmol/L (ref 98–110)
Creat: 1.61 mg/dL — ABNORMAL HIGH (ref 0.50–1.03)
Globulin: 3 g/dL (calc) (ref 1.9–3.7)
Glucose, Bld: 93 mg/dL (ref 65–99)
Potassium: 4.7 mmol/L (ref 3.5–5.3)
Sodium: 145 mmol/L (ref 135–146)
Total Bilirubin: 0.4 mg/dL (ref 0.2–1.2)
Total Protein: 7.2 g/dL (ref 6.1–8.1)
eGFR: 38 mL/min/{1.73_m2} — ABNORMAL LOW (ref >=60)

## 2022-01-04 LAB — CBC WITH DIFFERENTIAL/PLATELET
Absolute Monocytes: 593 cells/uL (ref 200–950)
Basophils Absolute: 30 cells/uL (ref 0–200)
Basophils Relative: 0.4 %
Eosinophils Absolute: 150 cells/uL (ref 15–500)
Eosinophils Relative: 2 %
HCT: 40 % (ref 35.0–45.0)
Hemoglobin: 12.5 g/dL (ref 11.7–15.5)
Lymphs Abs: 3945 cells/uL — ABNORMAL HIGH (ref 850–3900)
MCH: 26.2 pg — ABNORMAL LOW (ref 27.0–33.0)
MCHC: 31.3 g/dL — ABNORMAL LOW (ref 32.0–36.0)
MCV: 83.7 fL (ref 80.0–100.0)
MPV: 10.9 fL (ref 7.5–12.5)
Monocytes Relative: 7.9 %
Neutro Abs: 2783 cells/uL (ref 1500–7800)
Neutrophils Relative %: 37.1 %
Platelets: 207 10*3/uL (ref 140–400)
RBC: 4.78 10*6/uL (ref 3.80–5.10)
RDW: 14.3 % (ref 11.0–15.0)
Total Lymphocyte: 52.6 %
WBC: 7.5 10*3/uL (ref 3.8–10.8)

## 2022-01-04 LAB — SEDIMENTATION RATE: Sed Rate: 31 mm/h — ABNORMAL HIGH (ref 0–30)

## 2022-01-10 ENCOUNTER — Other Ambulatory Visit (HOSPITAL_COMMUNITY): Payer: Self-pay

## 2022-01-11 ENCOUNTER — Other Ambulatory Visit (HOSPITAL_COMMUNITY): Payer: Self-pay

## 2022-01-20 ENCOUNTER — Other Ambulatory Visit (HOSPITAL_COMMUNITY): Payer: Self-pay

## 2022-01-26 ENCOUNTER — Other Ambulatory Visit (HOSPITAL_COMMUNITY): Payer: Self-pay

## 2022-02-01 ENCOUNTER — Other Ambulatory Visit (HOSPITAL_COMMUNITY): Payer: Self-pay

## 2022-02-02 ENCOUNTER — Other Ambulatory Visit (HOSPITAL_COMMUNITY): Payer: Self-pay

## 2022-02-11 ENCOUNTER — Other Ambulatory Visit: Payer: Self-pay

## 2022-02-22 ENCOUNTER — Other Ambulatory Visit (HOSPITAL_COMMUNITY): Payer: Self-pay

## 2022-02-24 ENCOUNTER — Other Ambulatory Visit: Payer: Medicaid Other

## 2022-03-02 ENCOUNTER — Other Ambulatory Visit: Payer: Self-pay | Admitting: Internal Medicine

## 2022-03-02 ENCOUNTER — Other Ambulatory Visit (HOSPITAL_COMMUNITY): Payer: Self-pay

## 2022-03-02 DIAGNOSIS — L409 Psoriasis, unspecified: Secondary | ICD-10-CM

## 2022-03-02 DIAGNOSIS — L4050 Arthropathic psoriasis, unspecified: Secondary | ICD-10-CM

## 2022-03-02 DIAGNOSIS — Z79899 Other long term (current) drug therapy: Secondary | ICD-10-CM

## 2022-03-03 ENCOUNTER — Telehealth: Payer: Self-pay | Admitting: Internal Medicine

## 2022-03-03 MED ORDER — HUMIRA (2 PEN) 40 MG/0.4ML ~~LOC~~ AJKT
40.0000 mg | AUTO-INJECTOR | SUBCUTANEOUS | 0 refills | Status: DC
  Filled 2022-03-03: qty 2, 28d supply, fill #0
  Filled 2022-04-18: qty 2, 28d supply, fill #1

## 2022-03-03 NOTE — Telephone Encounter (Signed)
-----  Message from [place] sent at 03/03/2022  8:21 AM EST ----- Patient is due for a follow up in 03/2022 per Dr. Benjamine Mola. Thanks!

## 2022-03-03 NOTE — Telephone Encounter (Signed)
-----  Message from [place] sent at 03/03/2022  8:21 AM EST ----- Patient is due for a follow up in 03/2022 per Dr. Benjamine Mola. Thanks!

## 2022-03-03 NOTE — Telephone Encounter (Signed)
Next Visit: due 03/2022, message sent to the front desk to schedule.   Last Visit: 01/03/2022  Last Fill: 12/09/2021  UI:[id number] arthritis   Current Dose per office note on 01/03/2022: Plan to continue Humira 40 mg subcu q. 14 days.   Labs: 01/03/2022 BUN 50, creat 1.61, GFR 38, BUN/creat ratio 31, ALT 37, MCH 26.2, MCHC 31.3, lymphs abs 3,945  TB Gold: 07/07/2021 negative    Okay to refill humira?

## 2022-03-03 NOTE — Telephone Encounter (Signed)
LMOM for patient to call and schedule follow-up appointment.   °

## 2022-03-04 ENCOUNTER — Other Ambulatory Visit: Payer: Self-pay

## 2022-03-04 ENCOUNTER — Other Ambulatory Visit (HOSPITAL_COMMUNITY): Payer: Self-pay

## 2022-03-07 ENCOUNTER — Other Ambulatory Visit (HOSPITAL_COMMUNITY): Payer: Self-pay

## 2022-03-08 ENCOUNTER — Other Ambulatory Visit: Payer: Self-pay

## 2022-03-13 IMAGING — CR DG SHOULDER 2+V*L*
3 series · 3 of 3 positions shown · non-contrast
Comparison: Radiographs 01/24/2020

CLINICAL DATA: Acute on chronic left shoulder pain. Spider bite in
[REDACTED], sling or

EXAM:
LEFT SHOULDER - 2+ VIEW

[shoulder grashey]
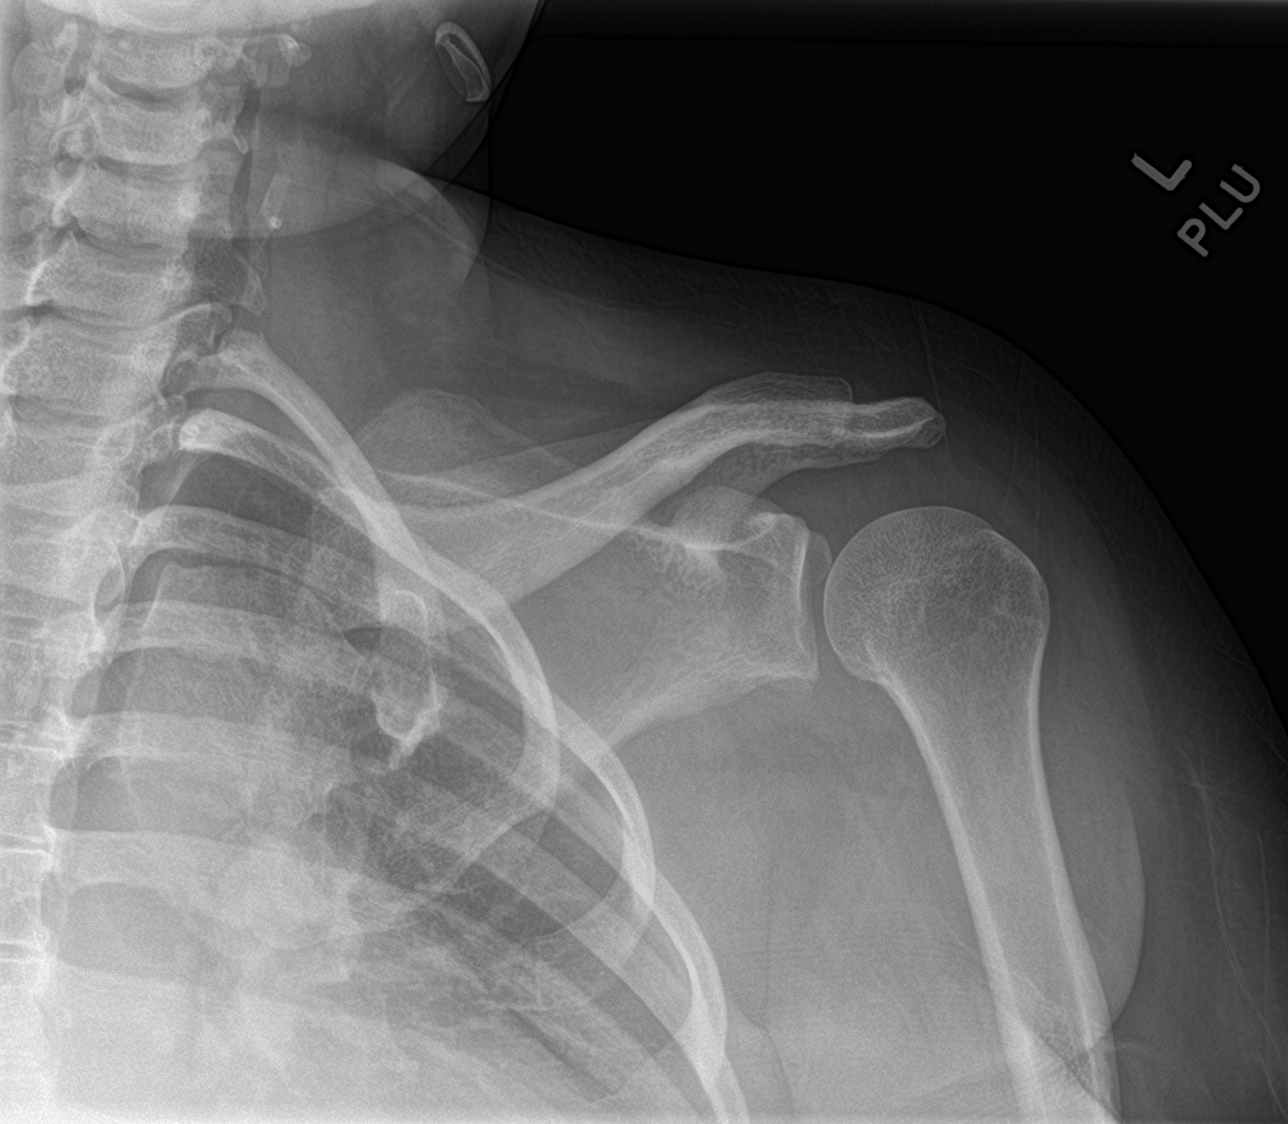

[shoulder y view]
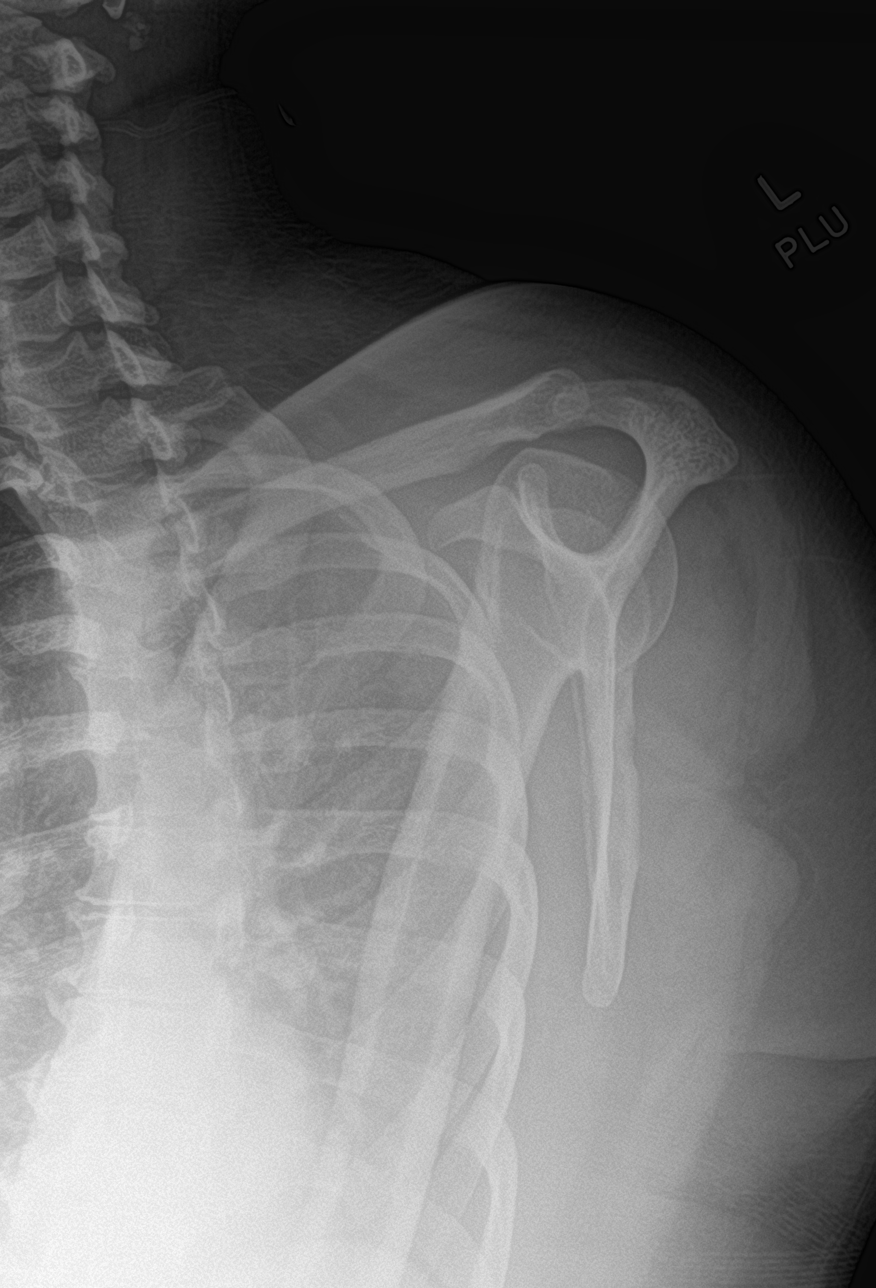

[shoulder ap neutral]
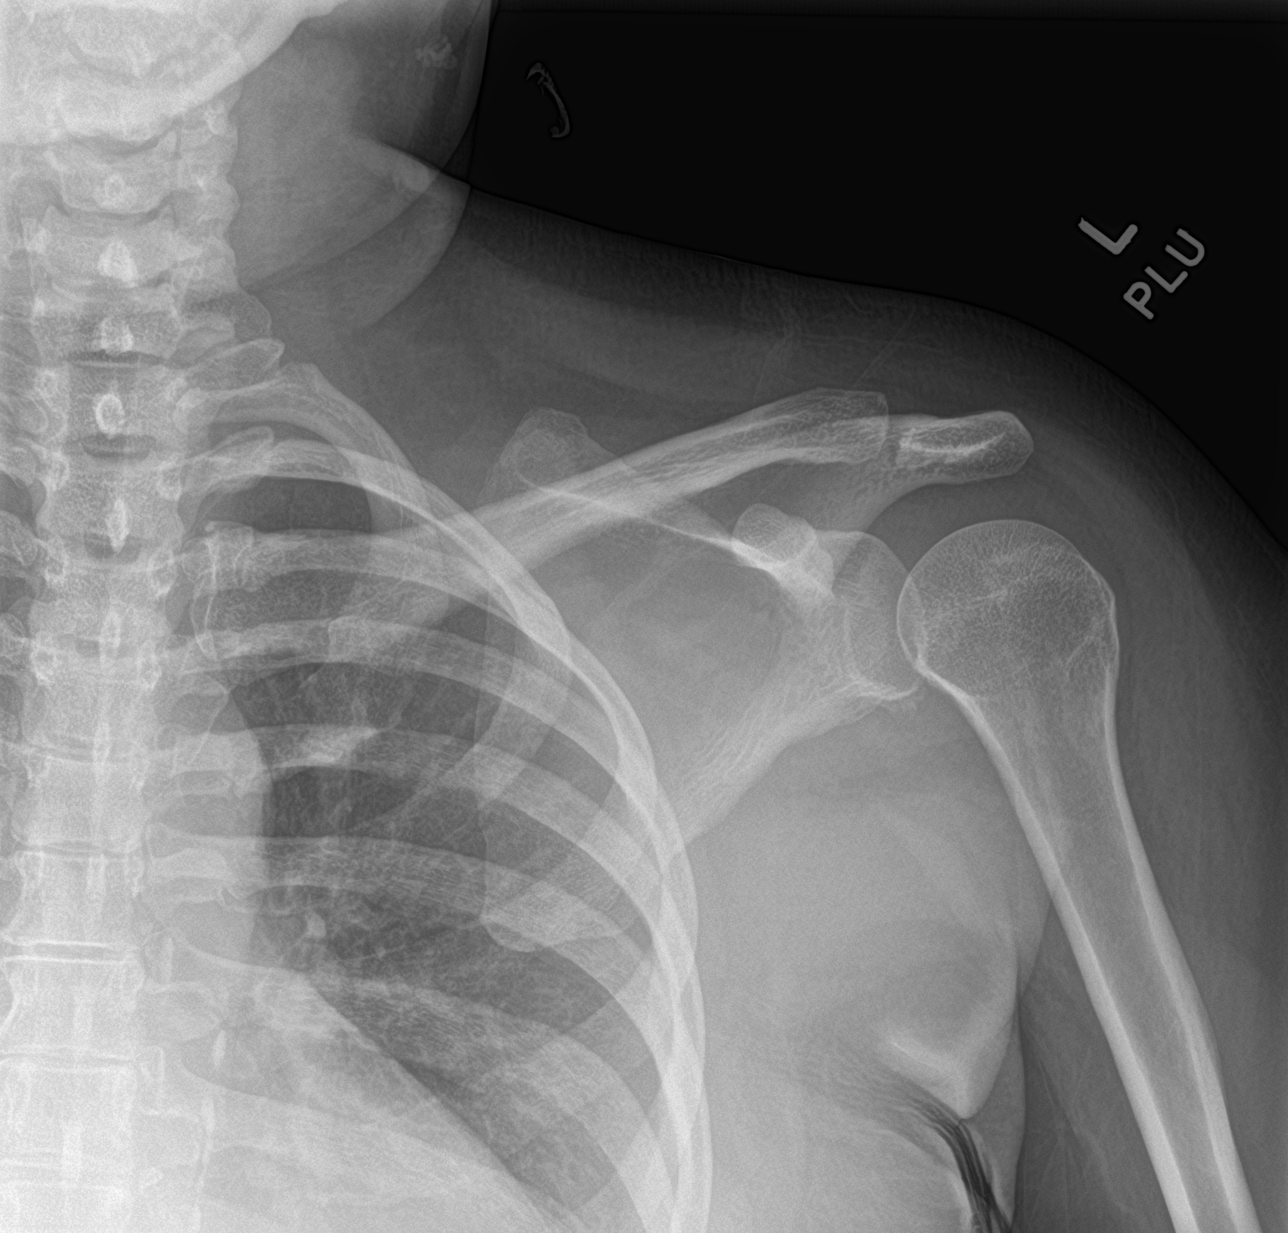

[3 of 3 positions shown; findings below may reference images not displayed]

FINDINGS: Some mild residual soft tissue swelling over the lateral soft
tissues of the upper arm, incompletely included within the level of
imaging. No acute bony abnormality. Specifically, no fracture,
subluxation, or dislocation. No suspicious lytic or blastic lesions.
No radiographic features of osteomyelitis.
IMPRESSION: No acute or worrisome osseous abnormalities.

Question some residual soft tissue swelling over the lateral aspect
of the upper arm, incompletely included within the level of imaging.
No visible soft tissue gas or foreign body.

## 2022-03-18 ENCOUNTER — Other Ambulatory Visit: Payer: Medicaid Other

## 2022-04-01 ENCOUNTER — Other Ambulatory Visit (HOSPITAL_COMMUNITY): Payer: Self-pay

## 2022-04-13 ENCOUNTER — Other Ambulatory Visit (HOSPITAL_COMMUNITY): Payer: Self-pay

## 2022-04-18 ENCOUNTER — Other Ambulatory Visit (HOSPITAL_COMMUNITY): Payer: Self-pay

## 2022-04-19 ENCOUNTER — Other Ambulatory Visit (HOSPITAL_COMMUNITY): Payer: Self-pay

## 2022-04-26 ENCOUNTER — Other Ambulatory Visit: Payer: Medicaid Other

## 2022-04-27 ENCOUNTER — Other Ambulatory Visit: Payer: Self-pay | Admitting: Nurse Practitioner

## 2022-04-27 DIAGNOSIS — Z1231 Encounter for screening mammogram for malignant neoplasm of breast: Secondary | ICD-10-CM

## 2022-05-11 ENCOUNTER — Other Ambulatory Visit (HOSPITAL_COMMUNITY): Payer: Self-pay

## 2022-05-16 ENCOUNTER — Other Ambulatory Visit (HOSPITAL_COMMUNITY): Payer: Self-pay

## 2022-06-10 ENCOUNTER — Telehealth: Payer: Self-pay | Admitting: Pharmacist

## 2022-06-10 NOTE — Telephone Encounter (Addendum)
Received notification from  [facility]  regarding a prior authorization for HUMIRA. Authorization has been APPROVED from 06/10/2022 to 06/10/2023.   Patient can fill through [facility]: [phone]   Authorization # [id number]  Chesley Mires, PharmD, MPH, BCPS, CPP Clinical Pharmacist (Rheumatology and Pulmonology)

## 2022-06-10 NOTE — Telephone Encounter (Signed)
Patient is overdue for f/u appoitment (due Feb 2024). Additionally no f/u appt scheduled. PA for Humira renewed in case she follows up  CMM Key: [id number]  Chesley Mires, PharmD, MPH, BCPS, CPP Clinical Pharmacist (Rheumatology and Pulmonology)

## 2022-06-23 ENCOUNTER — Ambulatory Visit: Payer: Medicaid Other

## 2022-07-08 ENCOUNTER — Ambulatory Visit
Admission: RE | Admit: 2022-07-08 | Discharge: 2022-07-08 | Disposition: A | Discharge status: Home / Self Care | Payer: Medicaid Other | Source: Ambulatory Visit | Attending: Nurse Practitioner | Admitting: Nurse Practitioner

## 2022-07-08 DIAGNOSIS — Z1231 Encounter for screening mammogram for malignant neoplasm of breast: Secondary | ICD-10-CM

## 2022-07-14 ENCOUNTER — Other Ambulatory Visit (HOSPITAL_COMMUNITY): Payer: Self-pay

## 2022-07-19 ENCOUNTER — Ambulatory Visit
Admission: EM | Admit: 2022-07-19 | Discharge: 2022-07-19 | Disposition: A | Discharge status: Home / Self Care | Payer: Medicaid Other | Attending: Internal Medicine | Admitting: Internal Medicine

## 2022-07-19 ENCOUNTER — Ambulatory Visit (INDEPENDENT_AMBULATORY_CARE_PROVIDER_SITE_OTHER): Payer: Medicaid Other

## 2022-07-19 ENCOUNTER — Other Ambulatory Visit: Payer: Self-pay

## 2022-07-19 ENCOUNTER — Encounter: Payer: Self-pay | Admitting: Emergency Medicine

## 2022-07-19 DIAGNOSIS — W19XXXA Unspecified fall, initial encounter: Secondary | ICD-10-CM | POA: Diagnosis not present

## 2022-07-19 DIAGNOSIS — M25512 Pain in left shoulder: Secondary | ICD-10-CM

## 2022-07-19 NOTE — Discharge Instructions (Addendum)
All x-rays are normal.  Suspect bruising and inflammation.  Recommend ice application and following up with orthopedist if symptoms persist or worsen.
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
## 2022-07-19 NOTE — ED Triage Notes (Signed)
Pt here for left arm and shoulder pain after trip and fall today
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
## 2022-08-17 ENCOUNTER — Ambulatory Visit: Payer: Medicaid Other | Attending: Nurse Practitioner

## 2022-08-17 ENCOUNTER — Other Ambulatory Visit: Payer: Self-pay

## 2022-08-17 DIAGNOSIS — R262 Difficulty in walking, not elsewhere classified: Secondary | ICD-10-CM | POA: Insufficient documentation

## 2022-08-17 DIAGNOSIS — M6281 Muscle weakness (generalized): Secondary | ICD-10-CM | POA: Insufficient documentation

## 2022-08-17 DIAGNOSIS — R296 Repeated falls: Secondary | ICD-10-CM | POA: Insufficient documentation

## 2022-08-17 DIAGNOSIS — R208 Other disturbances of skin sensation: Secondary | ICD-10-CM | POA: Insufficient documentation

## 2022-08-31 ENCOUNTER — Ambulatory Visit: Payer: Medicaid Other

## 2022-09-14 ENCOUNTER — Ambulatory Visit: Payer: Medicaid Other

## 2022-09-16 ENCOUNTER — Other Ambulatory Visit: Payer: Self-pay | Admitting: Nurse Practitioner

## 2022-09-16 DIAGNOSIS — E2839 Other primary ovarian failure: Secondary | ICD-10-CM
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
## 2022-09-21 ENCOUNTER — Other Ambulatory Visit: Payer: Medicaid Other

## 2022-09-23 ENCOUNTER — Ambulatory Visit: Payer: Medicaid Other | Attending: Internal Medicine | Admitting: Internal Medicine

## 2022-09-23 ENCOUNTER — Encounter: Payer: Self-pay | Admitting: Internal Medicine

## 2022-09-23 VITALS — BP 123/74 | HR 71 | Resp 14 | Ht 62.0 in | Wt 212.0 lb

## 2022-09-23 DIAGNOSIS — L4050 Arthropathic psoriasis, unspecified: Secondary | ICD-10-CM

## 2022-09-23 DIAGNOSIS — Z79899 Other long term (current) drug therapy: Secondary | ICD-10-CM | POA: Diagnosis not present

## 2022-09-23 DIAGNOSIS — N1832 Chronic kidney disease, stage 3b: Secondary | ICD-10-CM

## 2022-09-23 DIAGNOSIS — M349 Systemic sclerosis, unspecified: Secondary | ICD-10-CM | POA: Diagnosis not present

## 2022-09-23 NOTE — Patient Instructions (Signed)

## 2022-09-26 LAB — QUANTIFERON-TB GOLD PLUS
Mitogen-NIL: >10 [IU]/mL
NIL: 0.12 [IU]/mL
QuantiFERON-TB Gold Plus: NEGATIVE
TB1-NIL: <0 [IU]/mL
TB2-NIL: <0 [IU]/mL

## 2022-09-26 LAB — SEDIMENTATION RATE: Sed Rate: 17 mm/h (ref 0–30)

## 2022-09-26 LAB — COMPLETE METABOLIC PANEL WITH GFR
AG Ratio: 1.5 (calc) (ref 1.0–2.5)
ALT: 22 U/L (ref 6–29)
AST: 15 U/L (ref 10–35)
Albumin: 4.1 g/dL (ref 3.6–5.1)
Alkaline phosphatase (APISO): 79 U/L (ref 37–153)
BUN/Creatinine Ratio: 24 (calc) — ABNORMAL HIGH (ref 6–22)
BUN: 47 mg/dL — ABNORMAL HIGH (ref 7–25)
CO2: 30 mmol/L (ref 20–32)
Calcium: 9.7 mg/dL (ref 8.6–10.4)
Chloride: 102 mmol/L (ref 98–110)
Creat: 1.98 mg/dL — ABNORMAL HIGH (ref 0.50–1.03)
Globulin: 2.7 g/dL (ref 1.9–3.7)
Glucose, Bld: 297 mg/dL — ABNORMAL HIGH (ref 65–99)
Potassium: 4.3 mmol/L (ref 3.5–5.3)
Sodium: 140 mmol/L (ref 135–146)
Total Bilirubin: 0.5 mg/dL (ref 0.2–1.2)
Total Protein: 6.8 g/dL (ref 6.1–8.1)
eGFR: 30 mL/min/{1.73_m2} — ABNORMAL LOW (ref >=60)

## 2022-09-26 LAB — CBC WITH DIFFERENTIAL/PLATELET
Absolute Monocytes: 592 {cells}/uL (ref 200–950)
Basophils Absolute: 41 {cells}/uL (ref 0–200)
Basophils Relative: 0.6 %
Eosinophils Absolute: 190 {cells}/uL (ref 15–500)
Eosinophils Relative: 2.8 %
HCT: 38 % (ref 35.0–45.0)
Hemoglobin: 11.4 g/dL — ABNORMAL LOW (ref 11.7–15.5)
Lymphs Abs: 2496 {cells}/uL (ref 850–3900)
MCH: 25.7 pg — ABNORMAL LOW (ref 27.0–33.0)
MCHC: 30 g/dL — ABNORMAL LOW (ref 32.0–36.0)
MCV: 85.8 fL (ref 80.0–100.0)
MPV: 11.2 fL (ref 7.5–12.5)
Monocytes Relative: 8.7 %
Neutro Abs: 3482 {cells}/uL (ref 1500–7800)
Neutrophils Relative %: 51.2 %
Platelets: 201 10*3/uL (ref 140–400)
RBC: 4.43 10*6/uL (ref 3.80–5.10)
RDW: 13.6 % (ref 11.0–15.0)
Total Lymphocyte: 36.7 %
WBC: 6.8 10*3/uL (ref 3.8–10.8)

## 2022-09-28 ENCOUNTER — Ambulatory Visit: Payer: Medicaid Other | Attending: Nurse Practitioner

## 2022-10-11 ENCOUNTER — Telehealth: Payer: Self-pay | Admitting: Physical Therapy

## 2022-10-11 ENCOUNTER — Telehealth: Payer: Self-pay | Admitting: Internal Medicine

## 2022-10-11 NOTE — Telephone Encounter (Signed)
Per last office note on 09/23/2022, Previously had good response to Humira. Plan to resume Humira 40 mg subcu q. 14 days.   Patient did have labs drawn in the office on 09/23/2022. Patient does have an active prior authorization from 06/10/2022 to 06/10/2023 per pharmacy note on 06/10/2022. Patient will fill through [facility]. Please advise.

## 2022-10-11 NOTE — Telephone Encounter (Signed)
Patient called checking the status of her prescription for Humira.  Patient states at her last appointment Dr. Dimple Casey was going to have her restart the medication.

## 2022-10-11 NOTE — Telephone Encounter (Signed)
Contacted patient to remind her of this appointment due to previous No shows and cancellations. Left voicemail with appointment details. Asked that she call to cancel the appointment if she does not plan to attend.

## 2022-10-12 ENCOUNTER — Ambulatory Visit: Payer: Medicaid Other | Attending: Nurse Practitioner | Admitting: Physical Therapy

## 2022-10-17 ENCOUNTER — Other Ambulatory Visit: Payer: Self-pay | Admitting: *Deleted

## 2022-10-17 ENCOUNTER — Other Ambulatory Visit: Payer: Self-pay

## 2022-10-17 ENCOUNTER — Other Ambulatory Visit (HOSPITAL_COMMUNITY): Payer: Self-pay

## 2022-10-17 DIAGNOSIS — L4050 Arthropathic psoriasis, unspecified: Secondary | ICD-10-CM

## 2022-10-17 DIAGNOSIS — Z79899 Other long term (current) drug therapy: Secondary | ICD-10-CM

## 2022-10-17 DIAGNOSIS — L409 Psoriasis, unspecified: Secondary | ICD-10-CM

## 2022-10-17 MED ORDER — HUMIRA (2 PEN) 40 MG/0.4ML ~~LOC~~ AJKT
40.0000 mg | AUTO-INJECTOR | SUBCUTANEOUS | 0 refills | Status: DC
Start: 2022-10-17 — End: 2023-01-26
  Filled 2022-10-17 – 2022-11-16 (×2): qty 6, 84d supply, fill #0

## 2022-10-17 NOTE — Telephone Encounter (Signed)
Please call patient to schedule f/u appt, Return in about 3 months (around 12/24/2022) for PsA ADA restart f/u 3mos.

## 2022-10-17 NOTE — Telephone Encounter (Signed)
Last Fill: 03/03/2022  Labs: 09/23/2022 Glucose 297, BUN 47, Creat 1.98, eGFR 30, BUN/Creatinine Ratio 24, Hemoglobin 11.4, MCH 25.7, MCHC 30.0,   TB Gold: 09/23/2022  NEGATIVE   Next Visit: Message sent to front desk to schedule appt, Return in about 3 months (around 12/24/2022) for PsA ADA restart f/u 3mos.   Last Visit: 09/23/2022   [id number] arthritis   Current Dose per office note 09/23/2022: Humira 40 mg subcu q. 14 days.   Okay to refill Humira?

## 2022-10-27 ENCOUNTER — Other Ambulatory Visit: Payer: Self-pay

## 2022-10-27 ENCOUNTER — Other Ambulatory Visit (HOSPITAL_COMMUNITY): Payer: Self-pay

## 2022-11-04 ENCOUNTER — Ambulatory Visit: Payer: Medicaid Other | Admitting: Internal Medicine

## 2022-11-04 ENCOUNTER — Encounter: Payer: Self-pay | Admitting: Internal Medicine

## 2022-11-04 VITALS — Ht 62.0 in | Wt 210.0 lb

## 2022-11-04 DIAGNOSIS — E1165 Type 2 diabetes mellitus with hyperglycemia: Secondary | ICD-10-CM

## 2022-11-04 DIAGNOSIS — Z794 Long term (current) use of insulin: Secondary | ICD-10-CM | POA: Diagnosis not present

## 2022-11-04 LAB — POCT GLYCOSYLATED HEMOGLOBIN (HGB A1C): Hemoglobin A1C: 8.4 % — AB (ref 4.0–5.6)

## 2022-11-04 MED ORDER — OMNIPOD DASH PODS (GEN 4) MISC: 1.0000 | 2 refills | Status: DC

## 2022-11-04 NOTE — Progress Notes (Signed)
Name: Alexandria Meyer  MRN/ DOB: [id number], 1970/07/09   Age/ Sex: 52 y.o., female    PCP: Levonne Lapping, NP   Reason for Endocrinology Evaluation: Type 2 Diabetes Mellitus     Date of Initial Endocrinology Visit: 08/03/2020    PATIENT IDENTIFIER: Alexandria Meyer is a 52 y.o. female with a past medical history of T2DM and HN with hx of Pancreatitis ( while living in [place] ) . The patient presented for initial endocrinology clinic visit on 08/03/2020 for consultative assistance with her diabetes management.    HPI: Alexandria Meyer was   Diagnosed with DM at age 35 started with gestational diabetes  Prior Medications tried/Intolerance: was on Trulicity in 2009 , intolerant to metformin Hemoglobin A1c has ranged from 9.1% in 04/2020, peaking at 13.5% in 2018.  Cushingoid Features: She has been noted with buffalo hump and elevated urinary cortisol at 59.4 mcg/24 in 05/2020 Salivary cortisol came back abnormal at 0.21 mcg/dL at 11 pm ( 2-1.30) but the second sample was normal at 0.05 mcg/dL .  Repeat 24-hour urinary cortisol was normal at 9.6 mcg on 08/20/2020     SUBJECTIVE:   During the last visit (05/21/2021): A1c 7.7 % we adjusted basal/prandial regimen and continued [place]  Today (11/04/22): Alexandria Meyer is here for follow-up on diabetes management.  The patient has NOT been to our office in 17 months.  She checks her blood sugars multiple times daily through CGM.   She was  started on OmniPod Dash ~2 days ago She was trained by the OmniPod rep Randall An   Denies nausea Stable lower extremity edema No constipation or diarrhea  This patient with type 2 diabetes is treated with omnipod dash (insulin pump). During the visit the pump basal and bolus doses were reviewed including carb/insulin rations and supplemental doses. The clinical list was updated. The glucose meter download was reviewed in detail to determine if the current pump settings are providing the best  glycemic control without excessive hypoglycemia.  Pump and meter download:    Pump   OmniPod Dash Settings   Insulin type   NovoLog   Basal rate       0000 1.5              I:C ratio       0000 1:15                  Sensitivity       0000  50      Goal       0000  100            Type & Model of Pump: OmniPod Dash Insulin Type: Currently using NovoLog.  Body mass index is 38.41 kg/m.  PUMP STATISTICS: Limited data with only 2 days   HOME DIABETES REGIMEN: Farxiga 10 mg daily  Novolog     Statin: yes ACE-I/ARB: yes Prior Diabetic Education: no     CONTINUOUS GLUCOSE MONITORING RECORD INTERPRETATION    Dates of Recording: 9/14 - 11/04/2022  Sensor description: Freestyle libre  Results statistics:   CGM use % of time 91  Average and SD 158/35  Time in range      70%  % Time Above 180 23  % Time above 250 7  % Time Below target 0    Glycemic patterns summary: BGs have been optimal through the day and night  Hyperglycemic episodes  postprandial   Hypoglycemic episodes occurred where during the day  Overnight periods: Tight      DIABETIC COMPLICATIO/NS: Microvascular complications:  Neuropathy Denies: CKD, retinopathy  Last eye exam: Completed 2022  Macrovascular complications:   Denies: CAD, PVD, CVA   PAST HISTORY: Past Medical History:  Past Medical History:  Diagnosis Date   Diabetes mellitus    Hypertension    Neuropathy in diabetes (HCC)    Osteoarthritis    Psoriasis    Psoriatic arthritis (HCC)    Scoliosis    Past Surgical History:  Past Surgical History:  Procedure Laterality Date   BACK SURGERY  2014   CESAREAN SECTION     TUBAL LIGATION      Social History:  reports that she has never smoked. She has never been exposed to tobacco smoke. She has never used smokeless tobacco. She reports that she does not drink alcohol and does not use drugs. Family History:  Family History  Problem Relation Age of  Onset   Diabetes Mother    Diabetes Father      HOME MEDICATIONS: Allergies as of 11/04/2022       Reactions   Exenatide Nausea And Vomiting, Other (See Comments)   Pancreatitis history   Lisinopril Other (See Comments)   Cough, Pancreatitis   Penicillins Itching, Rash, Hives   Has patient had a PCN reaction causing immediate rash, facial/tongue/throat swelling, SOB or lightheadedness with hypotension: Yes Has patient had a PCN reaction causing severe rash involving mucus membranes or skin necrosis: Yes Has patient had a PCN reaction that required hospitalization: Yes Has patient had a PCN reaction occurring within the last 10 years: No If all of the above answers are "NO", then may proceed with Cephalosporin use. Has patient had a PCN reaction causing immediate rash, facial/tongue/throat swelling, SOB or lightheadedness with hypotension: Yes Has patient had a PCN reaction causing severe rash involving mucus membranes or skin necrosis: Yes Has patient had a PCN reaction that required hospitalization: Yes Has patient had a PCN reaction occurring within the last 10 years: No If all of the above answers are "NO", then may proceed with Cephalosporin use. itching   Canagliflozin Other (See Comments)   Yeast infections   Metformin Diarrhea, Nausea And Vomiting   Nausea and vomiting-caused  hospitalization        Medication List        Accurate as of November 04, 2022  3:45 PM. If you have any questions, ask your nurse or doctor.          STOP taking these medications    Tresiba FlexTouch 200 UNIT/ML FlexTouch Pen Generic drug: insulin degludec Stopped by: Johnney Ou Awais Cobarrubias       TAKE these medications    Accu-Chek Guide test strip Generic drug: glucose blood   Accu-Chek Softclix Lancets lancets SMARTSIG:2 Topical Twice Daily   acyclovir 800 MG tablet Commonly known as: ZOVIRAX Take 800 mg by mouth 5 (five) times daily. What changed: Another medication with  the same name was removed. Continue taking this medication, and follow the directions you see here. Changed by: Johnney Ou Salinda Snedeker   albuterol 108 (90 Base) MCG/ACT inhaler Commonly known as: VENTOLIN HFA Inhale 2 puffs into the lungs every 6 (six) hours as needed for wheezing.   Alcohol Prep Pads   ALLERGY MED PO Take 1 tablet by mouth daily as needed (allergies).   atorvastatin 40 MG tablet Commonly known as: LIPITOR   B-D UF III MINI PEN NEEDLES 31G X 5 MM Misc Generic drug: Insulin Pen  Needle 1 Device by Does not apply route in the morning, at noon, in the evening, and at bedtime.   betamethasone dipropionate 0.05 % cream Apply topically.   calcipotriene 0.005 % cream Commonly known as: DOVONOX Apply topically.   cetirizine 10 MG tablet Commonly known as: ZYRTEC   dapagliflozin propanediol 10 MG Tabs tablet Commonly known as: FARXIGA Take 1 tablet (10 mg total) by mouth daily.   Dexcom G6 Sensor Misc   Dexcom G6 Transmitter Misc   diclofenac Sodium 1 % Gel Commonly known as: VOLTAREN Apply 2-4 g topically 3 (three) times daily.   ezetimibe 10 MG tablet Commonly known as: ZETIA Take 10 mg by mouth daily.   fluticasone 50 MCG/ACT nasal spray Commonly known as: FLONASE Place into both nostrils.   furosemide 20 MG tablet Commonly known as: LASIX   gabapentin 300 MG capsule Commonly known as: NEURONTIN Take 600 mg by mouth 3 (three) times daily.   Humira (2 Pen) 40 MG/0.4ML pen Generic drug: adalimumab Inject 0.4 mLs (40 mg total) into the skin every 14 (fourteen) days. 1 kit - 2 pens   insulin aspart 100 unit/mL injection Commonly known as: novoLOG Inject into the skin.   Lantus SoloStar 100 UNIT/ML Solostar Pen Generic drug: insulin glargine Inject 66 Units into the skin in the morning and at bedtime.   losartan 100 MG tablet Commonly known as: COZAAR Take 1 tablet by mouth daily.   methocarbamol 750 MG tablet Commonly known as:  ROBAXIN Take 750 mg by mouth 3 (three) times daily as needed for muscle spasms.   metoprolol succinate 50 MG 24 hr tablet Commonly known as: TOPROL-XL Take 100 mg by mouth daily.   metoprolol succinate 100 MG 24 hr tablet Commonly known as: TOPROL-XL Take 100 mg by mouth daily.   metoprolol tartrate 50 MG tablet Commonly known as: LOPRESSOR   metoprolol tartrate 25 MG tablet Commonly known as: LOPRESSOR Take 1 tablet by mouth 2 (two) times daily.   metoprolol tartrate 100 MG tablet Commonly known as: LOPRESSOR Take by mouth.   naloxone 4 MG/0.1ML Liqd nasal spray kit Commonly known as: NARCAN Place 4 mg into the nose as needed.   NovoLOG 100 UNIT/ML injection Generic drug: insulin aspart Inject into the skin. What changed: Another medication with the same name was removed. Continue taking this medication, and follow the directions you see here. Changed by: Johnney Ou Tykiera Raven   omeprazole 40 MG capsule Commonly known as: PRILOSEC Take 40 mg by mouth daily.   Omnipod 5 G6 Pods (Gen 5) Misc 1 Device by Does not apply route every other day.   Omnipod 5 G6 Intro (Gen 5) Kit 1 Device by Does not apply route every other day.   polyethylene glycol powder 17 GM/SCOOP powder Commonly known as: GLYCOLAX/MIRALAX Take 17 g by mouth daily.   rosuvastatin 40 MG tablet Commonly known as: CRESTOR Take 40 mg by mouth daily.   spironolactone-hydrochlorothiazide 25-25 MG tablet Commonly known as: ALDACTAZIDE Take 1 tablet by mouth daily.   sucralfate 1 g tablet Commonly known as: CARAFATE Take 1 g by mouth 2 (two) times daily with a meal.   traMADol 50 MG tablet Commonly known as: ULTRAM Take 2 tablets 3 times a day by oral route for 30 days.   traMADol HCl 100 MG Tabs Take 2 tablets by mouth 2 (two) times daily.         ALLERGIES: Allergies  Allergen Reactions   Exenatide Nausea And Vomiting and  Other (See Comments)    Pancreatitis history    Lisinopril  Other (See Comments)    Cough, Pancreatitis    Penicillins Itching, Rash and Hives    Has patient had a PCN reaction causing immediate rash, facial/tongue/throat swelling, SOB or lightheadedness with hypotension: Yes Has patient had a PCN reaction causing severe rash involving mucus membranes or skin necrosis: Yes Has patient had a PCN reaction that required hospitalization: Yes Has patient had a PCN reaction occurring within the last 10 years: No If all of the above answers are "NO", then may proceed with Cephalosporin use.  Has patient had a PCN reaction causing immediate rash, facial/tongue/throat swelling, SOB or lightheadedness with hypotension: Yes Has patient had a PCN reaction causing severe rash involving mucus membranes or skin necrosis: Yes Has patient had a PCN reaction that required hospitalization: Yes Has patient had a PCN reaction occurring within the last 10 years: No If all of the above answers are "NO", then may proceed with Cephalosporin use. itching    Canagliflozin Other (See Comments)    Yeast infections   Metformin Diarrhea and Nausea And Vomiting    Nausea and vomiting-caused  hospitalization        OBJECTIVE:   VITAL SIGNS: Ht 5\' 2"  (1.575 m)   Wt 210 lb (95.3 kg)   LMP 06/28/2016   BMI 38.41 kg/m    PHYSICAL EXAM:  General: Pt appears well and is in NAD  Lungs: Clear with good BS bilat   Heart: RRR   Extremities:  Lower extremities - trace edema   Neuro: MS is good with appropriate affect, pt is alert and Ox3    Dm Foot Exam 05/21/2021 The skin of the feet is intact without sores or ulcerations. The pedal pulses are 2+ on right and 2+ on left. The sensation is intact to a screening 5.07, 10 gram monofilament bilaterally  DATA REVIEWED:  Lab Results  Component Value Date   HGBA1C 8.4 (A) 11/04/2022   HGBA1C 7.7 (A) 05/21/2021   HGBA1C 8.4 (A) 08/03/2020    Latest Reference Range & Units 09/23/22 11:13  Sodium 135 - 146 mmol/L 140   Potassium 3.5 - 5.3 mmol/L 4.3  Chloride 98 - 110 mmol/L 102  CO2 20 - 32 mmol/L 30  Glucose 65 - 99 mg/dL 829 (H)  BUN 7 - 25 mg/dL 47 (H)  Creatinine 5.62 - 1.03 mg/dL 1.30 (H)  Calcium 8.6 - 10.4 mg/dL 9.7  BUN/Creatinine Ratio 6 - 22 (calc) 24 (H)  eGFR > OR = 60 mL/min/1.71m2 30 (L)  AG Ratio 1.0 - 2.5 (calc) 1.5  AST 10 - 35 U/L 15  ALT 6 - 29 U/L 22  Total Protein 6.1 - 8.1 g/dL 6.8  Total Bilirubin 0.2 - 1.2 mg/dL 0.5  (H): Data is abnormally high (L): Data is abnormally low  ASSESSMENT / PLAN / RECOMMENDATIONS:   1) Type 2 Diabetes Mellitus, poorly controlled, With Neuropathic  complications - Most recent A1c of 8.4 %. Goal A1c < 7.0 %.    -She was recently started on OmniPod Dash -In reviewing her freestyle [employer], the patient has been noted with tight BG's overnight, will decrease basal insulin as below -She is not on Farxiga anymore -She does have preset boluses that she uses for the pump <149 = 10 150-199 = 12  etc She is intolerant to metformin Due to history of pancreatitis, GLP 1 agonist and DPP 4 inhibitors are CONTRAINDICATED  MEDICATIONS: Continue NovoLog  Pump   OmniPod Dash Settings   Insulin type   NovoLog   Basal rate       0000 1.45              I:C ratio       0000 1:15                  Sensitivity       0000  50      Goal       0000  100         EDUCATION / INSTRUCTIONS: BG monitoring instructions: Patient is instructed to check her blood sugars 3 times a day, before meals . Call [facility] if: BG persistently < 70  I reviewed the Rule of 15 for the treatment of hypoglycemia in detail with the patient. Literature supplied.   2) Diabetic complications:  Eye: Does not have known diabetic retinopathy.  Neuro/ Feet: Does  have known diabetic peripheral neuropathy. Renal: Patient does not have known baseline CKD. She is on an ARB at present.   3) Dyslipidemia:   -LDL at  goal  Medication Continue rosuvastatin 40 mg daily   Follow-up in 6 months     Signed electronically by: Lyndle Herrlich, MD  [facility]  [facility] [address] [place], [place] [address] Phone: [phone] FAX: [phone]   CC: Levonne Lapping, NP [address] [place] [address] Phone: [phone]  Fax: [phone]    Return to Endocrinology clinic as below: Future Appointments  Date Time Provider Department Center  12/19/2022 10:20 AM Dimple Casey, Jamesetta Orleans, MD [facility] None  03/08/2023 12:10 PM Arynn Armand, Konrad Dolores, MD [facility] None  03/20/2023 10:00 AM [facility] DX DEXA 1 [facility]

## 2022-11-07 ENCOUNTER — Encounter: Payer: Self-pay | Admitting: Internal Medicine

## 2022-11-09 ENCOUNTER — Other Ambulatory Visit: Payer: Self-pay

## 2022-11-09 ENCOUNTER — Telehealth: Payer: Self-pay | Admitting: *Deleted

## 2022-11-09 NOTE — Telephone Encounter (Signed)
Patient states she has contacted the pharmacy for a refill on her Humira. Patient states she is having trouble getting the prescription filled. Patient states she was advised that a patient advocate would need to call the pharmacy to release the prescription before she can have it filled.

## 2022-11-09 NOTE — Telephone Encounter (Signed)
Patient contacted the office and left message requesting a call back. Patient states she has not received her Humira in 2 months.   Reached out to the patient and advised patient her prescription has been sent to the pharmacy. Patient provided with pharmacy number to call the pharmacy to set up shipment.

## 2022-11-11 ENCOUNTER — Other Ambulatory Visit: Payer: Self-pay

## 2022-11-11 ENCOUNTER — Other Ambulatory Visit: Payer: Self-pay | Admitting: Pharmacist

## 2022-11-11 ENCOUNTER — Other Ambulatory Visit (HOSPITAL_COMMUNITY): Payer: Self-pay

## 2022-11-11 NOTE — Progress Notes (Signed)
Patient off of Humira for some time and now needing to be onboarded. Was doing well on Humira previosuly  Chesley Mires, PharmD, MPH, BCPS, CPP Clinical Pharmacist (Rheumatology and Pulmonology)

## 2022-11-16 ENCOUNTER — Other Ambulatory Visit: Payer: Self-pay

## 2022-11-16 ENCOUNTER — Other Ambulatory Visit (HOSPITAL_COMMUNITY): Payer: Self-pay

## 2022-11-16 NOTE — Progress Notes (Signed)
Specialty Pharmacy Initial Fill Coordination Note  Alexandria Meyer is a 52 y.o. female contacted today regarding refills of specialty medication(s) Adalimumab   Patient requested Delivery   Delivery date: 11/18/22   Verified address: [name] [place], [place] [address]   Medication will be filled on 11/17/2022.   Patient is aware of $4.00 copayment.
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
## 2022-12-19 ENCOUNTER — Encounter: Payer: Self-pay | Admitting: Internal Medicine

## 2022-12-19 ENCOUNTER — Ambulatory Visit: Payer: Medicaid Other | Attending: Internal Medicine | Admitting: Internal Medicine

## 2022-12-19 VITALS — BP 123/78 | HR 65 | Resp 14 | Ht 62.0 in | Wt 211.0 lb

## 2022-12-19 DIAGNOSIS — Z79899 Other long term (current) drug therapy: Secondary | ICD-10-CM

## 2022-12-19 DIAGNOSIS — L4050 Arthropathic psoriasis, unspecified: Secondary | ICD-10-CM | POA: Diagnosis not present

## 2022-12-19 DIAGNOSIS — N1832 Chronic kidney disease, stage 3b: Secondary | ICD-10-CM

## 2022-12-19 DIAGNOSIS — M349 Systemic sclerosis, unspecified: Secondary | ICD-10-CM

## 2022-12-20 LAB — COMPLETE METABOLIC PANEL WITH GFR
AG Ratio: 1.6 (calc) (ref 1.0–2.5)
ALT: 28 U/L (ref 6–29)
AST: 17 U/L (ref 10–35)
Albumin: 4.4 g/dL (ref 3.6–5.1)
Alkaline phosphatase (APISO): 67 U/L (ref 37–153)
BUN/Creatinine Ratio: 23 (calc) — ABNORMAL HIGH (ref 6–22)
BUN: 38 mg/dL — ABNORMAL HIGH (ref 7–25)
CO2: 35 mmol/L — ABNORMAL HIGH (ref 20–32)
Calcium: 9.8 mg/dL (ref 8.6–10.4)
Chloride: 101 mmol/L (ref 98–110)
Creat: 1.63 mg/dL — ABNORMAL HIGH (ref 0.50–1.03)
Globulin: 2.8 g/dL (ref 1.9–3.7)
Glucose, Bld: 104 mg/dL — ABNORMAL HIGH (ref 65–99)
Potassium: 5.3 mmol/L (ref 3.5–5.3)
Sodium: 141 mmol/L (ref 135–146)
Total Bilirubin: 0.5 mg/dL (ref 0.2–1.2)
Total Protein: 7.2 g/dL (ref 6.1–8.1)
eGFR: 37 mL/min/{1.73_m2} — ABNORMAL LOW (ref >=60)

## 2022-12-20 LAB — CBC WITH DIFFERENTIAL/PLATELET
Absolute Lymphocytes: 4131 {cells}/uL — ABNORMAL HIGH (ref 850–3900)
Absolute Monocytes: 810 {cells}/uL (ref 200–950)
Basophils Absolute: 28 {cells}/uL (ref 0–200)
Basophils Relative: 0.3 %
Eosinophils Absolute: 230 {cells}/uL (ref 15–500)
Eosinophils Relative: 2.5 %
HCT: 39.8 % (ref 35.0–45.0)
Hemoglobin: 11.8 g/dL (ref 11.7–15.5)
MCH: 25.6 pg — ABNORMAL LOW (ref 27.0–33.0)
MCHC: 29.6 g/dL — ABNORMAL LOW (ref 32.0–36.0)
MCV: 86.3 fL (ref 80.0–100.0)
MPV: 10.8 fL (ref 7.5–12.5)
Monocytes Relative: 8.8 %
Neutro Abs: 4002 {cells}/uL (ref 1500–7800)
Neutrophils Relative %: 43.5 %
Platelets: 200 10*3/uL (ref 140–400)
RBC: 4.61 10*6/uL (ref 3.80–5.10)
RDW: 13.6 % (ref 11.0–15.0)
Total Lymphocyte: 44.9 %
WBC: 9.2 10*3/uL (ref 3.8–10.8)

## 2022-12-20 LAB — SEDIMENTATION RATE: Sed Rate: 29 mm/h (ref 0–30)

## 2022-12-20 NOTE — Progress Notes (Signed)
Sedimentation rate of 29 is normal.  Blood count looks fine.  Kidney function is stable estimated GFR of 37.  Liver function is normal.  No problem for continuing the Humira.

## 2023-01-26 ENCOUNTER — Other Ambulatory Visit: Payer: Self-pay | Admitting: Internal Medicine

## 2023-01-26 ENCOUNTER — Other Ambulatory Visit: Payer: Self-pay

## 2023-01-26 DIAGNOSIS — L4050 Arthropathic psoriasis, unspecified: Secondary | ICD-10-CM

## 2023-01-26 DIAGNOSIS — L409 Psoriasis, unspecified: Secondary | ICD-10-CM

## 2023-01-26 DIAGNOSIS — Z79899 Other long term (current) drug therapy: Secondary | ICD-10-CM

## 2023-01-26 NOTE — Progress Notes (Signed)
Specialty Pharmacy Refill Coordination Note  Alexandria Meyer is a 52 y.o. female contacted today regarding refills of specialty medication(s) Adalimumab (Humira (2 Pen))   Patient requested Delivery   Delivery date: 01/31/23   Verified address: [name], [place] [address]   Medication will be filled on 01/30/23.

## 2023-01-26 NOTE — Telephone Encounter (Signed)
Last Fill: 10/17/2022  Labs: 12/19/2022 Sedimentation rate of 29 is normal.  Blood count looks fine.  Kidney function is stable estimated GFR of 37.  Liver function is normal.  No problem for continuing the Humira.   TB Gold: 09/23/2022 Negative   Next Visit: 03/20/2022  Last Visit: 12/19/2022  XB:[id number] arthritis   Current Dose per office note 12/19/2022: Humira 40 mg [place] q14days.   Okay to refill Humira?

## 2023-01-27 ENCOUNTER — Other Ambulatory Visit: Payer: Self-pay

## 2023-01-27 MED ORDER — HUMIRA (2 PEN) 40 MG/0.4ML ~~LOC~~ AJKT
40.0000 mg | AUTO-INJECTOR | SUBCUTANEOUS | 0 refills | Status: AC
Start: 2023-01-27 — End: ?
  Filled 2023-01-27: qty 2, 28d supply, fill #0
  Filled 2023-04-14: qty 2, 28d supply, fill #1
  Filled 2023-05-04: qty 2, 28d supply, fill #2

## 2023-01-30 ENCOUNTER — Other Ambulatory Visit: Payer: Self-pay
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
## 2023-03-08 ENCOUNTER — Ambulatory Visit: Payer: Medicaid Other | Admitting: Internal Medicine

## 2023-03-08 ENCOUNTER — Encounter: Payer: Self-pay | Admitting: Internal Medicine

## 2023-03-08 VITALS — BP 122/74 | HR 78 | Ht 62.0 in | Wt 210.0 lb

## 2023-03-08 DIAGNOSIS — Z794 Long term (current) use of insulin: Secondary | ICD-10-CM | POA: Diagnosis not present

## 2023-03-08 DIAGNOSIS — E1165 Type 2 diabetes mellitus with hyperglycemia: Secondary | ICD-10-CM | POA: Diagnosis not present

## 2023-03-08 DIAGNOSIS — E1122 Type 2 diabetes mellitus with diabetic chronic kidney disease: Secondary | ICD-10-CM

## 2023-03-08 DIAGNOSIS — E1142 Type 2 diabetes mellitus with diabetic polyneuropathy: Secondary | ICD-10-CM

## 2023-03-08 DIAGNOSIS — N1832 Chronic kidney disease, stage 3b: Secondary | ICD-10-CM

## 2023-03-08 LAB — POCT GLYCOSYLATED HEMOGLOBIN (HGB A1C): Hemoglobin A1C: 8.1 % — AB (ref 4.0–5.6)

## 2023-03-08 MED ORDER — OMNIPOD 5 LIBRE2 PLUS G6 PODS MISC: 1.0000 | 3 refills | Status: AC

## 2023-03-08 MED ORDER — FREESTYLE LIBRE 3 SENSOR MISC: 1.0000 | 3 refills | Status: DC

## 2023-03-08 MED ORDER — FREESTYLE LIBRE 2 PLUS SENSOR MISC: 1.0000 | 3 refills | Status: AC

## 2023-03-08 MED ORDER — OMNIPOD 5 LIBRE2 PLUS G6 KIT: 1.0000 | PACK | 0 refills | Status: DC

## 2023-03-08 NOTE — Patient Instructions (Signed)

## 2023-03-08 NOTE — Progress Notes (Signed)
Name: Alexandria Meyer  MRN/ DOB: [id number], Jul 30, 1970   Age/ Sex: 53 y.o., female    PCP: Levonne Lapping, NP   Reason for Endocrinology Evaluation: Type 2 Diabetes Mellitus     Date of Initial Endocrinology Visit: 08/03/2020    PATIENT IDENTIFIER: Alexandria Meyer is a 53 y.o. female with a past medical history of T2DM and HN with hx of Pancreatitis ( while living in [place] ) . The patient presented for initial endocrinology clinic visit on 08/03/2020 for consultative assistance with her diabetes management.    HPI: Alexandria Meyer was   Diagnosed with DM at age 40 started with gestational diabetes  Prior Medications tried/Intolerance: was on Trulicity in 2009 , intolerant to metformin Hemoglobin A1c has ranged from 9.1% in 04/2020, peaking at 13.5% in 2018.  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  Repeat 24-hour urinary cortisol was normal at 9.6 mcg on 08/20/2020     SUBJECTIVE:   During the last visit (11/04/2022): A1c 8.4%      Today (03/08/23): Alexandria Meyer is here for follow-up on diabetes management.   She checks her blood sugars multiple times daily through CGM.      Denies nausea or vomiting  No constipation or diarrhea  This patient with type 2 diabetes is treated with omnipod dash (insulin pump). During the visit the pump basal and bolus doses were reviewed including carb/insulin rations and supplemental doses. The clinical list was updated. The glucose meter download was reviewed in detail to determine if the current pump settings are providing the best glycemic control without excessive hypoglycemia.  Pump and meter download:    Pump   OmniPod Dash Settings   Insulin type   NovoLog   Basal rate       0000 1.45               I:C ratio       0000 1:15                  Sensitivity       0000  50      Goal       0000  100            Type & Model of Pump: OmniPod Dash Insulin Type: Currently using NovoLog.  Body mass index is 38.41 kg/m.  PUMP STATISTICS: Limited data with only 2 days   HOME DIABETES REGIMEN: Farxiga 10 mg daily  Novolog     Statin: yes ACE-I/ARB: yes Prior Diabetic Education: no     CONTINUOUS GLUCOSE MONITORING RECORD INTERPRETATION    Dates of Recording: 1/16-1/29/2025  Sensor description: Freestyle libre  Results statistics:   CGM use % of time 23  Average and SD 137/30.1  Time in range    78%  % Time Above 180 16  % Time above 250 0  % Time Below target 6    Glycemic patterns summary: Limited data with 23% use, BGs are mostly optimal  Hyperglycemic episodes  postprandial   Hypoglycemic episodes occurred rare, without a pattern  Overnight periods: Optimal      DIABETIC COMPLICATIO/NS: Microvascular complications:  Neuropathy Denies: CKD, retinopathy  Last eye exam: Completed 2022  Macrovascular complications:   Denies:  CAD, PVD, CVA   PAST HISTORY: Past Medical History:  Past Medical History:  Diagnosis Date   Diabetes mellitus    Hypertension    Neuropathy in diabetes (HCC)    Osteoarthritis    Psoriasis    Psoriatic arthritis (HCC)    Sciatica    Scoliosis    Past Surgical History:  Past Surgical History:  Procedure Laterality Date   BACK SURGERY  2014   CESAREAN SECTION     TUBAL LIGATION      Social History:  reports that she has never smoked. She has never been exposed to tobacco smoke. She has never used smokeless tobacco. She reports that she does not drink alcohol and does not use drugs. Family History:  Family History  Problem Relation Age of Onset   Diabetes Mother    Diabetes Father      HOME MEDICATIONS: Allergies as of 03/08/2023       Reactions   Exenatide Nausea And Vomiting, Other (See  Comments)   Pancreatitis history   Lisinopril Other (See Comments)   Cough, Pancreatitis   Penicillins Itching, Rash, Hives   Has patient had a PCN reaction causing immediate rash, facial/tongue/throat swelling, SOB or lightheadedness with hypotension: Yes Has patient had a PCN reaction causing severe rash involving mucus membranes or skin necrosis: Yes Has patient had a PCN reaction that required hospitalization: Yes Has patient had a PCN reaction occurring within the last 10 years: No If all of the above answers are "NO", then may proceed with Cephalosporin use. Has patient had a PCN reaction causing immediate rash, facial/tongue/throat swelling, SOB or lightheadedness with hypotension: Yes Has patient had a PCN reaction causing severe rash involving mucus membranes or skin necrosis: Yes Has patient had a PCN reaction that required hospitalization: Yes Has patient had a PCN reaction occurring within the last 10 years: No If all of the above answers are "NO", then may proceed with Cephalosporin use. itching   Canagliflozin Other (See Comments)   Yeast infections   Metformin Diarrhea, Nausea And Vomiting   Nausea and vomiting-caused  hospitalization        Medication List        Accurate as of March 08, 2023 11:59 PM. If you have any questions, ask your nurse or doctor.          STOP taking these medications    Dexcom G6 Transmitter Misc Stopped by: Johnney Ou Sansa Alkema   Omnipod DASH Pods (Gen 4) Misc Replaced by: Omnipod 5 Libre2 Plus G6 Kit Stopped by: Johnney Ou Tonae Livolsi       TAKE these medications    Accu-Chek Guide test strip Generic drug: glucose blood   Accu-Chek Softclix Lancets lancets SMARTSIG:2 Topical Twice Daily   acyclovir 800 MG tablet Commonly known as: ZOVIRAX Take 800 mg by mouth 5 (five) times daily.   albuterol 108 (90 Base) MCG/ACT inhaler Commonly known as: VENTOLIN HFA Inhale 2 puffs into the lungs every 6 (six) hours as needed  for wheezing.   Alcohol Prep Pads   ALLERGY MED PO Take 1 tablet by mouth daily as needed (allergies).   atorvastatin 40 MG tablet Commonly known as: LIPITOR   B-D UF III MINI PEN NEEDLES 31G X 5 MM Misc Generic drug: Insulin Pen Needle 1 Device by Does not apply route in the morning, at noon, in the evening, and at bedtime.   betamethasone dipropionate 0.05 % cream Apply topically.   calcipotriene 0.005 % cream Commonly known as:  DOVONOX Apply topically.   cetirizine 10 MG tablet Commonly known as: ZYRTEC   dapagliflozin propanediol 10 MG Tabs tablet Commonly known as: FARXIGA Take 1 tablet (10 mg total) by mouth daily.   diclofenac Sodium 1 % Gel Commonly known as: VOLTAREN Apply 2-4 g topically 3 (three) times daily.   ezetimibe 10 MG tablet Commonly known as: ZETIA Take 10 mg by mouth daily.   fluticasone 50 MCG/ACT nasal spray Commonly known as: FLONASE Place into both nostrils.   FreeStyle Libre 2 Plus Sensor Misc 1 Device by Does not apply route every 14 (fourteen) days. What changed:  See the new instructions. Another medication with the same name was removed. Continue taking this medication, and follow the directions you see here. Changed by: Johnney Ou Panagiotis Oelkers   furosemide 20 MG tablet Commonly known as: LASIX   gabapentin 300 MG capsule Commonly known as: NEURONTIN Take 600 mg by mouth 3 (three) times daily.   Humira (2 Pen) 40 MG/0.4ML pen Generic drug: adalimumab Inject 0.4 mLs (40 mg total) into the skin every 14 (fourteen) days. 1 kit - 2 pens   insulin aspart 100 unit/mL injection Commonly known as: novoLOG Inject into the skin.   Lantus SoloStar 100 UNIT/ML Solostar Pen Generic drug: insulin glargine Inject 66 Units into the skin in the morning and at bedtime.   losartan 100 MG tablet Commonly known as: COZAAR Take 1 tablet by mouth daily.   methocarbamol 750 MG tablet Commonly known as: ROBAXIN Take 750 mg by mouth 3 (three)  times daily as needed for muscle spasms.   methocarbamol 500 MG tablet Commonly known as: ROBAXIN Take by mouth.   metoprolol succinate 50 MG 24 hr tablet Commonly known as: TOPROL-XL Take 100 mg by mouth daily.   metoprolol succinate 100 MG 24 hr tablet Commonly known as: TOPROL-XL Take 100 mg by mouth daily.   metoprolol tartrate 50 MG tablet Commonly known as: LOPRESSOR   metoprolol tartrate 25 MG tablet Commonly known as: LOPRESSOR Take 1 tablet by mouth 2 (two) times daily.   metoprolol tartrate 100 MG tablet Commonly known as: LOPRESSOR Take by mouth.   naloxone 4 MG/0.1ML Liqd nasal spray kit Commonly known as: NARCAN Place 4 mg into the nose as needed.   NovoLOG 100 UNIT/ML injection Generic drug: insulin aspart Inject into the skin.   omeprazole 40 MG capsule Commonly known as: PRILOSEC Take 40 mg by mouth daily.   Omnipod 5 Libre2 Plus G6 Kit 1 Device by Does not apply route every other day. Replaces: Omnipod DASH Pods (Gen 4) Misc Started by: Johnney Ou Zari Cly   Omnipod 5 Libre2 Plus G6 Pods Misc 1 Device by Does not apply route every other day. Started by: Johnney Ou Amjad Fikes   polyethylene glycol powder 17 GM/SCOOP powder Commonly known as: GLYCOLAX/MIRALAX Take 17 g by mouth daily.   rosuvastatin 40 MG tablet Commonly known as: CRESTOR Take 40 mg by mouth daily.   spironolactone-hydrochlorothiazide 25-25 MG tablet Commonly known as: ALDACTAZIDE Take 1 tablet by mouth daily.   sucralfate 1 g tablet Commonly known as: CARAFATE Take 1 g by mouth 2 (two) times daily with a meal.   traMADol 50 MG tablet Commonly known as: ULTRAM Take 2 tablets 3 times a day by oral route for 30 days.   traMADol HCl 100 MG Tabs Take 2 tablets by mouth 2 (two) times daily.         ALLERGIES: Allergies  Allergen Reactions   Exenatide  Nausea And Vomiting and Other (See Comments)    Pancreatitis history    Lisinopril Other (See Comments)     Cough, Pancreatitis    Penicillins Itching, Rash and Hives    Has patient had a PCN reaction causing immediate rash, facial/tongue/throat swelling, SOB or lightheadedness with hypotension: Yes Has patient had a PCN reaction causing severe rash involving mucus membranes or skin necrosis: Yes Has patient had a PCN reaction that required hospitalization: Yes Has patient had a PCN reaction occurring within the last 10 years: No If all of the above answers are "NO", then may proceed with Cephalosporin use.  Has patient had a PCN reaction causing immediate rash, facial/tongue/throat swelling, SOB or lightheadedness with hypotension: Yes Has patient had a PCN reaction causing severe rash involving mucus membranes or skin necrosis: Yes Has patient had a PCN reaction that required hospitalization: Yes Has patient had a PCN reaction occurring within the last 10 years: No If all of the above answers are "NO", then may proceed with Cephalosporin use. itching    Canagliflozin Other (See Comments)    Yeast infections   Metformin Diarrhea and Nausea And Vomiting    Nausea and vomiting-caused  hospitalization        OBJECTIVE:   VITAL SIGNS: BP 122/74 (BP Location: Right Arm, Patient Position: Sitting, Cuff Size: Normal)   Pulse 78   Ht 5\' 2"  (1.575 m)   Wt 210 lb (95.3 kg)   LMP 06/28/2016   SpO2 97%   BMI 38.41 kg/m    PHYSICAL EXAM:  General: Alexandria Meyer appears well and is in NAD  Lungs: Clear with good BS bilat   Heart: RRR   Extremities:  Lower extremities - no edema   Neuro: MS is good with appropriate affect, Alexandria Meyer is alert and Ox3    Dm Foot Exam 03/08/2023 The skin of the feet is intact without sores or ulcerations. The pedal pulses are 2+ on right and 2+ on left. The sensation is intact to a screening 5.07, 10 gram monofilament bilaterally  DATA REVIEWED:  Lab Results  Component Value Date   HGBA1C 8.1 (A) 03/08/2023   HGBA1C 8.4 (A) 11/04/2022   HGBA1C 7.7 (A) 05/21/2021     Latest Reference Range & Units 03/08/23 12:29  Sodium 135 - 146 mmol/L 146  Potassium 3.5 - 5.3 mmol/L 4.3  Chloride 98 - 110 mmol/L 108  CO2 20 - 32 mmol/L 31  Glucose 65 - 99 mg/dL 161 (H)  BUN 7 - 25 mg/dL 33 (H)  Creatinine 0.96 - 1.03 mg/dL 0.45 (H)  Calcium 8.6 - 10.4 mg/dL 9.2  BUN/Creatinine Ratio 6 - 22 (calc) 24 (H)  Total CHOL/HDL Ratio <5.0 (calc) 2.6    Latest Reference Range & Units 03/08/23 12:29  Total CHOL/HDL Ratio <5.0 (calc) 2.6  Cholesterol <200 mg/dL 409  HDL Cholesterol > OR = 50 mg/dL 47 (L)  LDL Cholesterol (Calc) mg/dL (calc) 54  MICROALB/CREAT RATIO <30 mg/g creat 618 (H)  Non-HDL Cholesterol (Calc) <130 mg/dL (calc) 76  Triglycerides <150 mg/dL 811     Latest Reference Range & Units 03/08/23 12:29  TSH mIU/L 2.07    Latest Reference Range & Units 03/08/23 12:29  Microalb, Ur mg/dL 91.4  MICROALB/CREAT RATIO <30 mg/g creat 618 (H)  Creatinine, Urine 20 - 275 mg/dL 79  (H): Data is abnormally high   ASSESSMENT / PLAN / RECOMMENDATIONS:   1) Type 2 Diabetes Mellitus, poorly controlled, With CKD III , Neuropathic  complications -  Most recent A1c of 8.1 %. Goal A1c < 7.0 %.    -A1c is trending down, but continues to be above goal -She does have preset boluses that she uses for the pump <149 = 10 150-199 = 12 , it does not appear that she has been using on a regular basis, patient encouraged to use preset boluses each time she eats - She is intolerant to metformin -Due to history of pancreatitis, GLP 1 agonist and DPP 4 inhibitors are CONTRAINDICATED -I have also encouraged the patient to enter glucose data into the pump at least 3 times daily before each meal, the patient was under the impression that this is a step that she could skip, but I did explain to the patient that she is on the [employer] and she will have to do that each time that way she can get correction bolus if needed -A prescription for OmniPod 5, freestyle libre plus and a referral  to our CDE was placed today to upgrade her pump  MEDICATIONS: Farxiga 10 mg daily Continue NovoLog    Pump   OmniPod Dash Settings   Insulin type   NovoLog   Basal rate       0000 1.45              I:C ratio       0000 1:15                  Sensitivity       0000  50      Goal       0000  100         EDUCATION / INSTRUCTIONS: BG monitoring instructions: Patient is instructed to check her blood sugars 3 times a day, before meals . Call [facility] if: BG persistently < 70  I reviewed the Rule of 15 for the treatment of hypoglycemia in detail with the patient. Literature supplied.   2) Diabetic complications:  Eye: Does not have known diabetic retinopathy.  Neuro/ Feet: Does  have known diabetic peripheral neuropathy. Renal: Patient does not have known baseline CKD. She is on an ARB at present.   3) Dyslipidemia:   -LDL at goal  Medication Continue rosuvastatin 40 mg daily  4)CKD III:   - Will defer to [facility]  -Patient has a follow-up next month  Follow-up in 4 months   I spent 25 minutes preparing to see the patient by review of recent labs, imaging and procedures, obtaining and reviewing separately obtained history, communicating with the patient, ordering medications, tests or procedures, and documenting clinical information in the EHR including the differential Dx, treatment, and any further evaluation and other management   Addendum; discussed lab results with the patient on 03/10/2023 at 10:30 AM.  She does follow-up with nephrology we discussed CKD and elevated MA/CR ratio    Signed electronically by: Lyndle Herrlich, MD  [facility]  [facility] [address] [place] [place], [place] [address] Phone: [phone] FAX: [phone]   CC: Levonne Lapping, NP [address] [place] [address] Phone: [phone]  Fax: [phone]    Return to Endocrinology clinic as  below: Future Appointments  Date Time Provider Department Center  03/20/2023 10:00 AM [facility] DX DEXA 1 [facility]  03/21/2023 10:00 AM Rice, Jamesetta Orleans, MD [facility] None  07/06/2023 10:30 AM Anzel Kearse, Konrad Dolores, MD [facility] None

## 2023-03-09 ENCOUNTER — Encounter: Payer: Self-pay | Admitting: Internal Medicine

## 2023-03-09 LAB — BASIC METABOLIC PANEL
BUN/Creatinine Ratio: 24 (calc) — ABNORMAL HIGH (ref 6–22)
BUN: 33 mg/dL — ABNORMAL HIGH (ref 7–25)
CO2: 31 mmol/L (ref 20–32)
Calcium: 9.2 mg/dL (ref 8.6–10.4)
Chloride: 108 mmol/L (ref 98–110)
Creat: 1.35 mg/dL — ABNORMAL HIGH (ref 0.50–1.03)
Glucose, Bld: 107 mg/dL — ABNORMAL HIGH (ref 65–99)
Potassium: 4.3 mmol/L (ref 3.5–5.3)
Sodium: 146 mmol/L (ref 135–146)

## 2023-03-09 LAB — MICROALBUMIN / CREATININE URINE RATIO
Creatinine, Urine: 79 mg/dL (ref 20–275)
Microalb Creat Ratio: 618 mg/g{creat} — ABNORMAL HIGH (ref <30)
Microalb, Ur: 48.8 mg/dL

## 2023-03-09 LAB — LIPID PANEL
Cholesterol: 123 mg/dL (ref <200)
HDL: 47 mg/dL — ABNORMAL LOW (ref >=50)
LDL Cholesterol (Calc): 54 mg/dL
Non-HDL Cholesterol (Calc): 76 mg/dL (ref <130)
Total CHOL/HDL Ratio: 2.6 (calc) (ref <5.0)
Triglycerides: 139 mg/dL (ref <150)

## 2023-03-09 LAB — TSH: TSH: 2.07 m[IU]/L

## 2023-03-10 DIAGNOSIS — Z794 Long term (current) use of insulin: Secondary | ICD-10-CM | POA: Insufficient documentation

## 2023-03-16 ENCOUNTER — Other Ambulatory Visit: Payer: Self-pay | Admitting: Physician Assistant

## 2023-03-16 DIAGNOSIS — E2839 Other primary ovarian failure: Secondary | ICD-10-CM

## 2023-03-20 ENCOUNTER — Inpatient Hospital Stay: Admission: RE | Admit: 2023-03-20 | Payer: Medicaid Other | Source: Ambulatory Visit

## 2023-03-21 ENCOUNTER — Ambulatory Visit: Payer: Medicaid Other | Admitting: Internal Medicine

## 2023-03-21 DIAGNOSIS — Z79899 Other long term (current) drug therapy: Secondary | ICD-10-CM

## 2023-03-21 DIAGNOSIS — M349 Systemic sclerosis, unspecified: Secondary | ICD-10-CM

## 2023-03-21 DIAGNOSIS — L4050 Arthropathic psoriasis, unspecified: Secondary | ICD-10-CM

## 2023-03-21 DIAGNOSIS — N1832 Chronic kidney disease, stage 3b: Secondary | ICD-10-CM

## 2023-03-22 ENCOUNTER — Other Ambulatory Visit (HOSPITAL_COMMUNITY): Payer: Self-pay

## 2023-03-22 ENCOUNTER — Telehealth: Payer: Self-pay

## 2023-03-22 NOTE — Telephone Encounter (Signed)
Pharmacy Patient Advocate Encounter   Received notification from CoverMyMeds that prior authorization for FreeStyle Libre 2 Plus Sensor is required/requested.   Insurance verification completed.   The patient is insured through [employer] .   Per test claim: PA required; PA submitted to above mentioned insurance via CoverMyMeds Key/confirmation #/EOC [id number] Status is pending

## 2023-04-05 NOTE — Telephone Encounter (Signed)
 Pharmacy Patient Advocate Encounter  Received notification from [facility] that Prior Authorization for [facility] 2 plus has been DENIED.  Full denial letter will be uploaded to the media tab. See denial reason below.    PA #/Case ID/Reference #: [id number]

## 2023-04-07 NOTE — Telephone Encounter (Signed)
 Patient states that she was able to get the [place]

## 2023-04-13 ENCOUNTER — Other Ambulatory Visit (HOSPITAL_COMMUNITY): Payer: Self-pay

## 2023-04-14 ENCOUNTER — Other Ambulatory Visit (HOSPITAL_COMMUNITY): Payer: Self-pay

## 2023-04-14 ENCOUNTER — Other Ambulatory Visit: Payer: Self-pay

## 2023-04-14 ENCOUNTER — Other Ambulatory Visit: Payer: Self-pay | Admitting: Pharmacy Technician

## 2023-04-14 NOTE — Progress Notes (Signed)
 Specialty Pharmacy Refill Coordination Note  Alexandria Meyer is a 53 y.o. female contacted today regarding refills of specialty medication(s) Adalimumab (Humira (2 Pen))   Patient requested Delivery   Delivery date: 04/18/23   Verified address: [address] [place]   Medication will be filled on 04/17/23.

## 2023-05-04 ENCOUNTER — Other Ambulatory Visit: Payer: Self-pay

## 2023-05-04 ENCOUNTER — Other Ambulatory Visit (HOSPITAL_COMMUNITY): Payer: Self-pay

## 2023-05-04 NOTE — Progress Notes (Signed)
 Specialty Pharmacy Refill Coordination Note  Alexandria Meyer is a 53 y.o. female contacted today regarding refills of specialty medication(s) Adalimumab (Humira (2 Pen))   Patient requested Delivery   Delivery date: 05/10/23   Verified address: [address]   [place] [address]   Medication will be filled on 05/09/23.

## 2023-05-04 NOTE — Progress Notes (Signed)
 Specialty Pharmacy Ongoing Clinical Assessment Note  Alexandria Meyer is a 53 y.o. female who is being followed by the specialty pharmacy service for RxSp Psoriatic Arthritis   Patient's specialty medication(s) reviewed today: Adalimumab (Humira (2 Pen))   Missed doses in the last 4 weeks: 0   Patient/Caregiver did not have any additional questions or concerns.   Therapeutic benefit summary: Patient is achieving benefit   Adverse events/side effects summary: No adverse events/side effects   Patient's therapy is appropriate to: Continue    Goals Addressed             This Visit's Progress    Avoid flare triggers   On track    Patient is initiating therapy. Patient will maintain adherence         Follow up:  6 months  Servando Snare Specialty Pharmacist

## 2023-05-10 ENCOUNTER — Telehealth: Payer: Self-pay | Admitting: Nutrition

## 2023-05-10 ENCOUNTER — Telehealth: Payer: Self-pay

## 2023-05-10 NOTE — Telephone Encounter (Signed)
 Patient did not want to come in to transfer her pump settings-saying that she feels comfortable doing this  herself.  She is currently wearing a Dash system.  She was talked through making changes to her PDM and given the last pump settings from Dr. Harvel Ricks note on 03/08/23.   She however, does not count carbs and does not know what carbs are.  She has never put anything into the carb box when bolusing.  She simply leaves that box empty.  She is however putting a blood sugar reading into the BG box to calculate her "meal time insulin dose".   She was not trained by me, so I am not sure what you want her to be taking.  I have switched her to a I/C ratio of "1".  She reports that her blood sugars are 160-210 acL.  She is eating 2-3 servings of carbs for breakfast (2 pieces of toast and sometime orange juice), rarely eats lunch, but will sometimes eat piece of fruit or crackers.  Her acS readings are in the low to mid 200s, and eating between 30-60 carbs.   Please advise.

## 2023-05-10 NOTE — Telephone Encounter (Signed)
 Patient called, I tried to get her to come in, but she would not come.  I talked her through the correct way to give a bolus-putting 4 to the carb box in the bolus menu, and she reported being able to do this, as I talked her through the steps..  Also stressed need to continue to put in blood sugar reading int the bg box, so that pump can calculate a more accurate insulin dose.  She reported that she did not understand why she needed to do this, and I again explained that this was to keep her blood sugar from going high after eating.  Stressed need to come in to see me to get all questions and concerns answered, but she would not do this.   Also discussed need to give 2 units before eating  snacks.  She reported good understanding of this and had no questions.

## 2023-05-10 NOTE — Telephone Encounter (Signed)
 Can you contact patient to help set up her Omnipod setting. She got a new pump

## 2023-05-15 ENCOUNTER — Telehealth: Payer: Self-pay | Admitting: Pharmacist

## 2023-05-15 NOTE — Telephone Encounter (Signed)
 Submitted a Prior Authorization RENEWAL request to [facility] for HUMIRA via CoverMyMeds. Will update once we receive a response.  Key: [id number]

## 2023-05-16 NOTE — Telephone Encounter (Signed)
 Received notification from [facility] regarding a prior authorization for HUMIRA. Authorization has been APPROVED from 05/15/2023 to 05/14/2024. Approval letter sent to scan center.  Authorization # [id number]  Chesley Mires, PharmD, MPH, BCPS, CPP Clinical Pharmacist (Rheumatology and Pulmonology)

## 2023-06-14 ENCOUNTER — Other Ambulatory Visit: Payer: Self-pay

## 2023-06-15 ENCOUNTER — Other Ambulatory Visit: Payer: Self-pay

## 2023-06-16 ENCOUNTER — Other Ambulatory Visit (HOSPITAL_COMMUNITY): Payer: Self-pay

## 2023-06-16 ENCOUNTER — Other Ambulatory Visit: Payer: Self-pay

## 2023-06-16 ENCOUNTER — Other Ambulatory Visit: Payer: Self-pay | Admitting: Internal Medicine

## 2023-06-16 DIAGNOSIS — Z79899 Other long term (current) drug therapy: Secondary | ICD-10-CM

## 2023-06-16 DIAGNOSIS — L409 Psoriasis, unspecified: Secondary | ICD-10-CM

## 2023-06-16 DIAGNOSIS — L4050 Arthropathic psoriasis, unspecified: Secondary | ICD-10-CM

## 2023-06-16 NOTE — Progress Notes (Signed)
 Specialty Pharmacy Refill Coordination Note  Alexandria Meyer is a 53 y.o. female contacted today regarding refills of specialty medication(s) Adalimumab  (Humira  (2 Pen))   Patient requested Delivery   Delivery date: 06/22/23   Verified address: [address]   [place] [address]   Medication will be filled on 05.14.25.     This fill date is pending response to refill request from provider. Patient is aware and if they have not received fill by intended date they must follow up with pharmacy.

## 2023-06-20 ENCOUNTER — Other Ambulatory Visit (HOSPITAL_COMMUNITY): Payer: Self-pay

## 2023-06-20 NOTE — Progress Notes (Signed)
 Spoke with patient. RR denied. Will need an appointment and updated labs.

## 2023-07-06 ENCOUNTER — Ambulatory Visit: Payer: Medicaid Other | Admitting: Internal Medicine

## 2023-07-06 NOTE — Progress Notes (Deleted)
 Name: Alexandria Meyer  MRN/ DOB: [id number], 01/27/1971   Age/ Sex: 53 y.o., female    PCP: Tann, Samandra, NP   Reason for Endocrinology Evaluation: Type 2 Diabetes Mellitus     Date of Initial Endocrinology Visit: 08/03/2020    PATIENT IDENTIFIER: Alexandria Meyer is a 53 y.o. female with a past medical history of T2DM and HN with hx of Pancreatitis ( while living in [place] ) . The patient presented for initial endocrinology clinic visit on 08/03/2020 for consultative assistance with her diabetes management.    HPI: Alexandria Meyer was   Diagnosed with DM at age 29 started with gestational diabetes  Prior Medications tried/Intolerance: was on Trulicity in 2009 , intolerant to metformin Hemoglobin A1c has ranged from 9.1% in 04/2020, peaking at 13.5% in 2018.  She was started on OmniPod 10/2022  I have attempted to refer her to our CDE for proper pump training, as she was using the OmniPod Dash and was not entering any glucose data, she declined to see our CDE, we will try to work her through the steps over the phone  Cushingoid Features: She has been noted with buffalo hump and elevated urinary cortisol at 59.4 mcg/24 in 05/2020 Salivary cortisol came back abnormal at 0.21 mcg/dL at 11 pm ( 3-8.75) but the second sample was normal at 0.05 mcg/dL .  Repeat 24-hour urinary cortisol was normal at 9.6 mcg on 08/20/2020     SUBJECTIVE:   During the last visit (03/08/2023): A1c 8.1%      Today (03/08/23): [name] is here for follow-up on diabetes management.   She checks her blood sugars multiple times daily through CGM.   She continues to follow-up with rheumatology for psoriatic arthritis and scleroderma.  Scleroderma was attributed to uncontrolled DM   Denies nausea or vomiting  No constipation or diarrhea  This patient with type 2 diabetes is treated with omnipod dash (insulin  pump). During the visit the pump basal and bolus doses were reviewed including carb/insulin  rations  and supplemental doses. The clinical list was updated. The glucose meter download was reviewed in detail to determine if the current pump settings are providing the best glycemic control without excessive hypoglycemia.  Pump and meter download:    Pump   OmniPod Dash Settings   Insulin  type   NovoLog    Basal rate       0000 1.45              I:C ratio       0000 1:15                  Sensitivity       0000  50      Goal       0000  100            Type & Model of Pump: OmniPod Dash Insulin  Type: Currently using NovoLog .  There is no height or weight on file to calculate BMI.  PUMP STATISTICS: Limited data with only 2 days   HOME DIABETES REGIMEN: Farxiga  10 mg daily  Novolog      Statin: yes ACE-I/ARB: yes Prior Diabetic Education: no     CONTINUOUS GLUCOSE MONITORING RECORD INTERPRETATION    Dates of Recording: 1/16-1/29/2025  Sensor description: Freestyle libre  Results statistics:   CGM use % of time 23  Average and SD 137/30.1  Time in range    78%  % Time Above 180 16  % Time above  250 0  % Time Below target 6    Glycemic patterns summary: Limited data with 23% use, BGs are mostly optimal  Hyperglycemic episodes  postprandial   Hypoglycemic episodes occurred rare, without a pattern  Overnight periods: Optimal      DIABETIC COMPLICATIO/NS: Microvascular complications:  Neuropathy Denies: CKD, retinopathy  Last eye exam: Completed 2022  Macrovascular complications:   Denies: CAD, PVD, CVA   PAST HISTORY: Past Medical History:  Past Medical History:  Diagnosis Date   Diabetes mellitus    Hypertension    Neuropathy in diabetes (HCC)    Osteoarthritis    Psoriasis    Psoriatic arthritis (HCC)    Sciatica    Scoliosis    Past Surgical History:  Past Surgical History:  Procedure Laterality Date   BACK SURGERY  2014   CESAREAN SECTION     TUBAL LIGATION      Social History:  reports that she has never smoked.  She has never been exposed to tobacco smoke. She has never used smokeless tobacco. She reports that she does not drink alcohol and does not use drugs. Family History:  Family History  Problem Relation Age of Onset   Diabetes Mother    Diabetes Father      HOME MEDICATIONS: Allergies as of 07/06/2023       Reactions   Exenatide Nausea And Vomiting, Other (See Comments)   Pancreatitis history   Lisinopril Other (See Comments)   Cough, Pancreatitis   Penicillins Itching, Rash, Hives   Has patient had a PCN reaction causing immediate rash, facial/tongue/throat swelling, SOB or lightheadedness with hypotension: Yes Has patient had a PCN reaction causing severe rash involving mucus membranes or skin necrosis: Yes Has patient had a PCN reaction that required hospitalization: Yes Has patient had a PCN reaction occurring within the last 10 years: No If all of the above answers are "NO", then may proceed with Cephalosporin use. Has patient had a PCN reaction causing immediate rash, facial/tongue/throat swelling, SOB or lightheadedness with hypotension: Yes Has patient had a PCN reaction causing severe rash involving mucus membranes or skin necrosis: Yes Has patient had a PCN reaction that required hospitalization: Yes Has patient had a PCN reaction occurring within the last 10 years: No If all of the above answers are "NO", then may proceed with Cephalosporin use. itching   Canagliflozin Other (See Comments)   Yeast infections   Metformin Diarrhea, Nausea And Vomiting   Nausea and vomiting-caused  hospitalization        Medication List        Accurate as of Jul 06, 2023  7:26 AM. If you have any questions, ask your nurse or doctor.          Accu-Chek Guide test strip Generic drug: glucose blood   Accu-Chek Softclix Lancets lancets SMARTSIG:2 Topical Twice Daily   acyclovir 800 MG tablet Commonly known as: ZOVIRAX Take 800 mg by mouth 5 (five) times daily.   acyclovir 400  MG tablet Commonly known as: ZOVIRAX Take 400 mg by mouth 3 (three) times daily.   albuterol 108 (90 Base) MCG/ACT inhaler Commonly known as: VENTOLIN HFA Inhale 2 puffs into the lungs every 6 (six) hours as needed for wheezing.   Alcohol Prep Pads   ALLERGY MED PO Take 1 tablet by mouth daily as needed (allergies).   atorvastatin 40 MG tablet Commonly known as: LIPITOR   B-D UF III MINI PEN NEEDLES 31G X 5 MM Misc Generic drug: Insulin   Pen Needle 1 Device by Does not apply route in the morning, at noon, in the evening, and at bedtime.   betamethasone dipropionate 0.05 % cream Apply topically.   calcipotriene 0.005 % cream Commonly known as: DOVONOX Apply topically.   cetirizine 10 MG tablet Commonly known as: ZYRTEC   dapagliflozin  propanediol 10 MG Tabs tablet Commonly known as: FARXIGA  Take 1 tablet (10 mg total) by mouth daily.   diclofenac Sodium 1 % Gel Commonly known as: VOLTAREN Apply 2-4 g topically 3 (three) times daily.   ezetimibe 10 MG tablet Commonly known as: ZETIA Take 10 mg by mouth daily.   famotidine 20 MG tablet Commonly known as: PEPCID Take 20 mg by mouth 2 (two) times daily.   fluticasone 50 MCG/ACT nasal spray Commonly known as: FLONASE Place into both nostrils.   FreeStyle Libre 2 Plus Sensor Misc 1 Device by Does not apply route every 14 (fourteen) days.   furosemide 20 MG tablet Commonly known as: LASIX   gabapentin 300 MG capsule Commonly known as: NEURONTIN Take 600 mg by mouth 3 (three) times daily.   gabapentin 800 MG tablet Commonly known as: NEURONTIN Take by mouth.   Humira  (2 Pen) 40 MG/0.4ML pen Generic drug: adalimumab  Inject 0.4 mLs (40 mg total) into the skin every 14 (fourteen) days. 1 kit - 2 pens   insulin  aspart 100 unit/mL injection Commonly known as: novoLOG  Inject into the skin.   Lantus  SoloStar 100 UNIT/ML Solostar Pen Generic drug: insulin  glargine Inject 66 Units into the skin in the morning  and at bedtime.   losartan 100 MG tablet Commonly known as: COZAAR Take 1 tablet by mouth daily.   methocarbamol  750 MG tablet Commonly known as: ROBAXIN  Take 750 mg by mouth 3 (three) times daily as needed for muscle spasms.   methocarbamol  500 MG tablet Commonly known as: ROBAXIN  Take by mouth.   metoprolol succinate 50 MG 24 hr tablet Commonly known as: TOPROL-XL Take 100 mg by mouth daily.   metoprolol succinate 100 MG 24 hr tablet Commonly known as: TOPROL-XL Take 100 mg by mouth daily.   metoprolol tartrate 50 MG tablet Commonly known as: LOPRESSOR   metoprolol tartrate 25 MG tablet Commonly known as: LOPRESSOR Take 1 tablet by mouth 2 (two) times daily.   metoprolol tartrate 100 MG tablet Commonly known as: LOPRESSOR Take by mouth.   naloxone 4 MG/0.1ML Liqd nasal spray kit Commonly known as: NARCAN Place 4 mg into the nose as needed.   NovoLOG  100 UNIT/ML injection Generic drug: insulin  aspart Inject into the skin.   omeprazole 40 MG capsule Commonly known as: PRILOSEC Take 40 mg by mouth daily.   Omnipod 5 Libre2 Plus G6 Kit 1 Device by Does not apply route every other day.   Omnipod 5 Libre2 Plus G6 Pods Misc 1 Device by Does not apply route every other day.   ondansetron  8 MG disintegrating tablet Commonly known as: ZOFRAN -ODT Take by mouth.   polyethylene glycol powder 17 GM/SCOOP powder Commonly known as: GLYCOLAX/MIRALAX Take 17 g by mouth daily.   rosuvastatin 40 MG tablet Commonly known as: CRESTOR Take 40 mg by mouth daily.   spironolactone-hydrochlorothiazide 25-25 MG tablet Commonly known as: ALDACTAZIDE Take 1 tablet by mouth daily.   sucralfate 1 g tablet Commonly known as: CARAFATE Take 1 g by mouth 2 (two) times daily with a meal.   traMADol 50 MG tablet Commonly known as: ULTRAM Take 2 tablets 3 times a day by oral  route for 30 days.   traMADol HCl 100 MG Tabs Take 2 tablets by mouth 2 (two) times daily.          ALLERGIES: Allergies  Allergen Reactions   Exenatide Nausea And Vomiting and Other (See Comments)    Pancreatitis history    Lisinopril Other (See Comments)    Cough, Pancreatitis    Penicillins Itching, Rash and Hives    Has patient had a PCN reaction causing immediate rash, facial/tongue/throat swelling, SOB or lightheadedness with hypotension: Yes Has patient had a PCN reaction causing severe rash involving mucus membranes or skin necrosis: Yes Has patient had a PCN reaction that required hospitalization: Yes Has patient had a PCN reaction occurring within the last 10 years: No If all of the above answers are "NO", then may proceed with Cephalosporin use.  Has patient had a PCN reaction causing immediate rash, facial/tongue/throat swelling, SOB or lightheadedness with hypotension: Yes Has patient had a PCN reaction causing severe rash involving mucus membranes or skin necrosis: Yes Has patient had a PCN reaction that required hospitalization: Yes Has patient had a PCN reaction occurring within the last 10 years: No If all of the above answers are "NO", then may proceed with Cephalosporin use. itching    Canagliflozin Other (See Comments)    Yeast infections   Metformin Diarrhea and Nausea And Vomiting    Nausea and vomiting-caused  hospitalization        OBJECTIVE:   VITAL SIGNS: LMP 06/28/2016    PHYSICAL EXAM:  General: Pt appears well and is in NAD  Lungs: Clear with good BS bilat   Heart: RRR   Extremities:  Lower extremities - no edema   Neuro: MS is good with appropriate affect, pt is alert and Ox3    Dm Foot Exam 03/08/2023 The skin of the feet is intact without sores or ulcerations. The pedal pulses are 2+ on right and 2+ on left. The sensation is intact to a screening 5.07, 10 gram monofilament bilaterally  DATA REVIEWED:  Lab Results  Component Value Date   HGBA1C 8.1 (A) 03/08/2023   HGBA1C 8.4 (A) 11/04/2022   HGBA1C 7.7 (A) 05/21/2021     Latest Reference Range & Units 03/08/23 12:29  Sodium 135 - 146 mmol/L 146  Potassium 3.5 - 5.3 mmol/L 4.3  Chloride 98 - 110 mmol/L 108  CO2 20 - 32 mmol/L 31  Glucose 65 - 99 mg/dL 161 (H)  BUN 7 - 25 mg/dL 33 (H)  Creatinine 0.96 - 1.03 mg/dL 0.45 (H)  Calcium 8.6 - 10.4 mg/dL 9.2  BUN/Creatinine Ratio 6 - 22 (calc) 24 (H)  Total CHOL/HDL Ratio <5.0 (calc) 2.6    Latest Reference Range & Units 03/08/23 12:29  Total CHOL/HDL Ratio <5.0 (calc) 2.6  Cholesterol <200 mg/dL 409  HDL Cholesterol > OR = 50 mg/dL 47 (L)  LDL Cholesterol (Calc) mg/dL (calc) 54  MICROALB/CREAT RATIO <30 mg/g creat 618 (H)  Non-HDL Cholesterol (Calc) <130 mg/dL (calc) 76  Triglycerides <150 mg/dL 811     Latest Reference Range & Units 03/08/23 12:29  TSH mIU/L 2.07    Latest Reference Range & Units 03/08/23 12:29  Microalb, Ur mg/dL 91.4  MICROALB/CREAT RATIO <30 mg/g creat 618 (H)  Creatinine, Urine 20 - 275 mg/dL 79  (H): Data is abnormally high   ASSESSMENT / PLAN / RECOMMENDATIONS:   1) Type 2 Diabetes Mellitus, poorly controlled, With CKD III , Neuropathic  complications - Most recent A1c of 8.1 %.  Goal A1c < 7.0 %.    -A1c is trending down, but continues to be above goal -She does have preset boluses that she uses for the pump <149 = 10 150-199 = 12 , it does not appear that she has been using on a regular basis, patient encouraged to use preset boluses each time she eats - She is intolerant to metformin -Due to history of pancreatitis, GLP 1 agonist and DPP 4 inhibitors are CONTRAINDICATED -I have also encouraged the patient to enter glucose data into the pump at least 3 times daily before each meal, the patient was under the impression that this is a step that she could skip, but I did explain to the patient that she is on the [employer] and she will have to do that each time that way she can get correction bolus if needed -A prescription for OmniPod 5, freestyle libre plus and a  referral to our CDE was placed today to upgrade her pump  MEDICATIONS: Farxiga  10 mg daily Continue NovoLog     Pump   OmniPod Dash Settings   Insulin  type   NovoLog    Basal rate       0000 1.45              I:C ratio       0000 1:15                  Sensitivity       0000  50      Goal       0000  100         EDUCATION / INSTRUCTIONS: BG monitoring instructions: Patient is instructed to check her blood sugars 3 times a day, before meals . Call [facility] if: BG persistently < 70  I reviewed the Rule of 15 for the treatment of hypoglycemia in detail with the patient. Literature supplied.   2) Diabetic complications:  Eye: Does not have known diabetic retinopathy.  Neuro/ Feet: Does  have known diabetic peripheral neuropathy. Renal: Patient does not have known baseline CKD. She is on an ARB at present.   3) Dyslipidemia:   -LDL at goal  Medication Continue rosuvastatin 40 mg daily  4)CKD III:   - Will defer to [facility]  -Patient has a follow-up next month  Follow-up in 4 months   I spent 25 minutes preparing to see the patient by review of recent labs, imaging and procedures, obtaining and reviewing separately obtained history, communicating with the patient, ordering medications, tests or procedures, and documenting clinical information in the EHR including the differential Dx, treatment, and any further evaluation and other management     Signed electronically by: Natale Bail, MD  [facility]  [facility] [address] [place], [place] [address] Phone: [phone] FAX: [phone]   CC: Forestine Igo, NP [address] [place] [address] Phone: [phone]  Fax: [phone]    Return to Endocrinology clinic as below: Future Appointments  Date Time Provider Department Center  07/06/2023 10:30 AM Syeda Prickett, Julian Obey, MD [id number] None

## 2023-07-09 ENCOUNTER — Encounter (HOSPITAL_COMMUNITY): Payer: Self-pay

## 2023-07-09 ENCOUNTER — Emergency Department (HOSPITAL_COMMUNITY)

## 2023-07-09 ENCOUNTER — Other Ambulatory Visit: Payer: Self-pay

## 2023-07-09 ENCOUNTER — Observation Stay (HOSPITAL_COMMUNITY)
Admission: EM | Admit: 2023-07-09 | Discharge: 2023-07-11 | Disposition: A | Discharge status: Home / Self Care | Attending: Internal Medicine | Admitting: Internal Medicine

## 2023-07-09 DIAGNOSIS — Z79899 Other long term (current) drug therapy: Secondary | ICD-10-CM | POA: Insufficient documentation

## 2023-07-09 DIAGNOSIS — E1165 Type 2 diabetes mellitus with hyperglycemia: Secondary | ICD-10-CM | POA: Diagnosis not present

## 2023-07-09 DIAGNOSIS — R0602 Shortness of breath: Secondary | ICD-10-CM | POA: Diagnosis present

## 2023-07-09 DIAGNOSIS — Z1152 Encounter for screening for COVID-19: Secondary | ICD-10-CM | POA: Diagnosis not present

## 2023-07-09 DIAGNOSIS — J189 Pneumonia, unspecified organism: Secondary | ICD-10-CM | POA: Diagnosis not present

## 2023-07-09 DIAGNOSIS — E785 Hyperlipidemia, unspecified: Secondary | ICD-10-CM | POA: Diagnosis not present

## 2023-07-09 DIAGNOSIS — Z7984 Long term (current) use of oral hypoglycemic drugs: Secondary | ICD-10-CM | POA: Insufficient documentation

## 2023-07-09 DIAGNOSIS — N1832 Chronic kidney disease, stage 3b: Secondary | ICD-10-CM | POA: Insufficient documentation

## 2023-07-09 DIAGNOSIS — E1122 Type 2 diabetes mellitus with diabetic chronic kidney disease: Secondary | ICD-10-CM | POA: Diagnosis not present

## 2023-07-09 DIAGNOSIS — J45901 Unspecified asthma with (acute) exacerbation: Secondary | ICD-10-CM | POA: Insufficient documentation

## 2023-07-09 DIAGNOSIS — B009 Herpesviral infection, unspecified: Secondary | ICD-10-CM | POA: Diagnosis not present

## 2023-07-09 DIAGNOSIS — Z794 Long term (current) use of insulin: Secondary | ICD-10-CM | POA: Diagnosis not present

## 2023-07-09 DIAGNOSIS — M549 Dorsalgia, unspecified: Secondary | ICD-10-CM | POA: Diagnosis not present

## 2023-07-09 DIAGNOSIS — I129 Hypertensive chronic kidney disease with stage 1 through stage 4 chronic kidney disease, or unspecified chronic kidney disease: Secondary | ICD-10-CM | POA: Diagnosis not present

## 2023-07-09 DIAGNOSIS — K219 Gastro-esophageal reflux disease without esophagitis: Secondary | ICD-10-CM | POA: Diagnosis not present

## 2023-07-09 DIAGNOSIS — G8929 Other chronic pain: Secondary | ICD-10-CM | POA: Insufficient documentation

## 2023-07-09 DIAGNOSIS — J9601 Acute respiratory failure with hypoxia: Secondary | ICD-10-CM | POA: Diagnosis not present

## 2023-07-09 LAB — GLUCOSE, CAPILLARY
Glucose-Capillary: 312 mg/dL — ABNORMAL HIGH (ref 70–99)
Glucose-Capillary: 501 mg/dL (ref 70–99)
Glucose-Capillary: 462 mg/dL — ABNORMAL HIGH (ref 70–99)
Glucose-Capillary: 406 mg/dL — ABNORMAL HIGH (ref 70–99)
Glucose-Capillary: 376 mg/dL — ABNORMAL HIGH (ref 70–99)

## 2023-07-09 LAB — CBC WITH DIFFERENTIAL/PLATELET
Abs Immature Granulocytes: 0.03 10*3/uL (ref 0.00–0.07)
Basophils Absolute: 0 10*3/uL (ref 0.0–0.1)
Basophils Relative: 0 %
Eosinophils Absolute: 0 10*3/uL (ref 0.0–0.5)
Eosinophils Relative: 0 %
HCT: 38.3 % (ref 36.0–46.0)
Hemoglobin: 11.5 g/dL — ABNORMAL LOW (ref 12.0–15.0)
Immature Granulocytes: 0 %
Lymphocytes Relative: 21 %
Lymphs Abs: 1.6 10*3/uL (ref 0.7–4.0)
MCH: 25.6 pg — ABNORMAL LOW (ref 26.0–34.0)
MCHC: 30 g/dL (ref 30.0–36.0)
MCV: 85.1 fL (ref 80.0–100.0)
Monocytes Absolute: 1 10*3/uL (ref 0.1–1.0)
Monocytes Relative: 13 %
Neutro Abs: 5 10*3/uL (ref 1.7–7.7)
Neutrophils Relative %: 66 %
Platelets: 159 10*3/uL (ref 150–400)
RBC: 4.5 MIL/uL (ref 3.87–5.11)
RDW: 15.5 % (ref 11.5–15.5)
WBC: 7.6 10*3/uL (ref 4.0–10.5)
nRBC: 0 % (ref 0.0–0.2)

## 2023-07-09 LAB — RESPIRATORY PANEL BY PCR

## 2023-07-09 LAB — TROPONIN I (HIGH SENSITIVITY)
Troponin I (High Sensitivity): 13 ng/L (ref <18)
Troponin I (High Sensitivity): 13 ng/L (ref <18)

## 2023-07-09 LAB — RESP PANEL BY RT-PCR (RSV, FLU A&B, COVID)  RVPGX2
Influenza A by PCR: NEGATIVE
Influenza B by PCR: NEGATIVE
Resp Syncytial Virus by PCR: NEGATIVE
SARS Coronavirus 2 by RT PCR: NEGATIVE

## 2023-07-09 LAB — COMPREHENSIVE METABOLIC PANEL WITH GFR
ALT: 63 U/L — ABNORMAL HIGH (ref 0–44)
AST: 44 U/L — ABNORMAL HIGH (ref 15–41)
Albumin: 3 g/dL — ABNORMAL LOW (ref 3.5–5.0)
Alkaline Phosphatase: 62 U/L (ref 38–126)
Anion gap: 12 (ref 5–15)
BUN: 28 mg/dL — ABNORMAL HIGH (ref 6–20)
CO2: 30 mmol/L (ref 22–32)
Calcium: 9 mg/dL (ref 8.9–10.3)
Chloride: 98 mmol/L (ref 98–111)
Creatinine, Ser: 1.47 mg/dL — ABNORMAL HIGH (ref 0.44–1.00)
GFR, Estimated: 42 mL/min — ABNORMAL LOW (ref >60)
Glucose, Bld: 139 mg/dL — ABNORMAL HIGH (ref 70–99)
Potassium: 3.8 mmol/L (ref 3.5–5.1)
Sodium: 140 mmol/L (ref 135–145)
Total Bilirubin: 1 mg/dL (ref 0.0–1.2)
Total Protein: 6.5 g/dL (ref 6.5–8.1)

## 2023-07-09 LAB — I-STAT CG4 LACTIC ACID, ED: Lactic Acid, Venous: 0.9 mmol/L (ref 0.5–1.9)

## 2023-07-09 LAB — HIV ANTIBODY (ROUTINE TESTING W REFLEX): HIV Screen 4th Generation wRfx: NONREACTIVE

## 2023-07-09 MED ORDER — INSULIN ASPART 100 UNIT/ML IJ SOLN: 0.0000 [IU] | Freq: Three times a day (TID) | INTRAMUSCULAR | Status: DC

## 2023-07-09 MED ORDER — INSULIN ASPART 100 UNIT/ML IJ SOLN: 5.0000 [IU] | Freq: Once | INTRAMUSCULAR | Status: DC

## 2023-07-09 MED ORDER — PREDNISONE 20 MG PO TABS
40.0000 mg | ORAL_TABLET | Freq: Every day | ORAL | Status: DC
  Administered 2023-07-10 – 2023-07-11 (×2): 40 mg via ORAL
  Filled 2023-07-09 (×2): qty 2

## 2023-07-09 MED ORDER — TRAMADOL HCL 50 MG PO TABS
50.0000 mg | ORAL_TABLET | Freq: Once | ORAL | Status: AC
  Administered 2023-07-09: 50 mg via ORAL
  Filled 2023-07-09: qty 1

## 2023-07-09 MED ORDER — INSULIN ASPART 100 UNIT/ML IJ SOLN
4.0000 [IU] | Freq: Three times a day (TID) | INTRAMUSCULAR | Status: DC
  Administered 2023-07-09 – 2023-07-11 (×7): 4 [IU] via SUBCUTANEOUS

## 2023-07-09 MED ORDER — ACETAMINOPHEN 325 MG PO TABS: 650.0000 mg | ORAL_TABLET | Freq: Once | ORAL | Status: DC

## 2023-07-09 MED ORDER — METOPROLOL SUCCINATE ER 50 MG PO TB24
100.0000 mg | ORAL_TABLET | Freq: Every day | ORAL | Status: DC
  Filled 2023-07-09: qty 2

## 2023-07-09 MED ORDER — MAGNESIUM SULFATE 2 GM/50ML IV SOLN
2.0000 g | Freq: Once | INTRAVENOUS | Status: AC
  Administered 2023-07-09: 2 g via INTRAVENOUS
  Filled 2023-07-09: qty 50

## 2023-07-09 MED ORDER — ENOXAPARIN SODIUM 40 MG/0.4ML IJ SOSY
40.0000 mg | PREFILLED_SYRINGE | INTRAMUSCULAR | Status: DC
  Administered 2023-07-09 – 2023-07-11 (×3): 40 mg via SUBCUTANEOUS
  Filled 2023-07-09 (×3): qty 0.4

## 2023-07-09 MED ORDER — EZETIMIBE 10 MG PO TABS
10.0000 mg | ORAL_TABLET | Freq: Every day | ORAL | Status: DC
  Administered 2023-07-09 – 2023-07-11 (×3): 10 mg via ORAL
  Filled 2023-07-09 (×3): qty 1

## 2023-07-09 MED ORDER — ACETAMINOPHEN 650 MG RE SUPP: 650.0000 mg | Freq: Four times a day (QID) | RECTAL | Status: DC | PRN

## 2023-07-09 MED ORDER — IPRATROPIUM-ALBUTEROL 0.5-2.5 (3) MG/3ML IN SOLN
3.0000 mL | RESPIRATORY_TRACT | Status: AC
  Administered 2023-07-09 (×3): 3 mL via RESPIRATORY_TRACT
  Filled 2023-07-09: qty 3
  Filled 2023-07-09: qty 6

## 2023-07-09 MED ORDER — GUAIFENESIN ER 600 MG PO TB12
600.0000 mg | ORAL_TABLET | Freq: Two times a day (BID) | ORAL | Status: DC
  Administered 2023-07-09 – 2023-07-11 (×5): 600 mg via ORAL
  Filled 2023-07-09 (×5): qty 1

## 2023-07-09 MED ORDER — ROSUVASTATIN CALCIUM 20 MG PO TABS
40.0000 mg | ORAL_TABLET | Freq: Every day | ORAL | Status: DC
  Administered 2023-07-09 – 2023-07-11 (×3): 40 mg via ORAL
  Filled 2023-07-09 (×3): qty 2

## 2023-07-09 MED ORDER — LACTATED RINGERS IV BOLUS
500.0000 mL | Freq: Once | INTRAVENOUS | Status: AC
  Administered 2023-07-09: 500 mL via INTRAVENOUS

## 2023-07-09 MED ORDER — INSULIN ASPART 100 UNIT/ML IJ SOLN
0.0000 [IU] | INTRAMUSCULAR | Status: DC
  Administered 2023-07-09: 5 [IU] via SUBCUTANEOUS
  Administered 2023-07-09: 6 [IU] via SUBCUTANEOUS
  Administered 2023-07-10 (×2): 4 [IU] via SUBCUTANEOUS
  Administered 2023-07-10: 5 [IU] via SUBCUTANEOUS

## 2023-07-09 MED ORDER — INSULIN ASPART 100 UNIT/ML IJ SOLN
5.0000 [IU] | Freq: Once | INTRAMUSCULAR | Status: AC
  Administered 2023-07-09: 5 [IU] via SUBCUTANEOUS

## 2023-07-09 MED ORDER — SENNOSIDES-DOCUSATE SODIUM 8.6-50 MG PO TABS
1.0000 | ORAL_TABLET | Freq: Every evening | ORAL | Status: DC | PRN
  Administered 2023-07-09: 1 via ORAL
  Filled 2023-07-09: qty 1

## 2023-07-09 MED ORDER — IPRATROPIUM-ALBUTEROL 0.5-2.5 (3) MG/3ML IN SOLN: 3.0000 mL | RESPIRATORY_TRACT | Status: DC | PRN

## 2023-07-09 MED ORDER — ACETAMINOPHEN 325 MG PO TABS: 650.0000 mg | ORAL_TABLET | Freq: Four times a day (QID) | ORAL | Status: DC | PRN

## 2023-07-09 MED ORDER — INSULIN PUMP
Freq: Three times a day (TID) | SUBCUTANEOUS | Status: DC
  Filled 2023-07-09: qty 1

## 2023-07-09 MED ORDER — METHYLPREDNISOLONE SODIUM SUCC 125 MG IJ SOLR
125.0000 mg | Freq: Once | INTRAMUSCULAR | Status: AC
  Administered 2023-07-09: 125 mg via INTRAVENOUS
  Filled 2023-07-09: qty 2

## 2023-07-09 MED ORDER — PANTOPRAZOLE SODIUM 40 MG PO TBEC
80.0000 mg | DELAYED_RELEASE_TABLET | Freq: Every day | ORAL | Status: DC
  Administered 2023-07-09 – 2023-07-11 (×3): 80 mg via ORAL
  Filled 2023-07-09 (×3): qty 2

## 2023-07-09 MED ORDER — GABAPENTIN 400 MG PO CAPS
400.0000 mg | ORAL_CAPSULE | Freq: Two times a day (BID) | ORAL | Status: DC
  Administered 2023-07-09 – 2023-07-11 (×5): 400 mg via ORAL
  Filled 2023-07-09 (×5): qty 1

## 2023-07-09 MED ORDER — LOSARTAN POTASSIUM 50 MG PO TABS
100.0000 mg | ORAL_TABLET | Freq: Every day | ORAL | Status: DC
  Filled 2023-07-09: qty 2

## 2023-07-09 MED ORDER — FAMOTIDINE 20 MG PO TABS: 20.0000 mg | ORAL_TABLET | Freq: Two times a day (BID) | ORAL | Status: DC

## 2023-07-09 MED ORDER — TRAMADOL HCL 50 MG PO TABS
50.0000 mg | ORAL_TABLET | Freq: Four times a day (QID) | ORAL | Status: DC | PRN
  Administered 2023-07-09 – 2023-07-10 (×2): 50 mg via ORAL
  Filled 2023-07-09 (×2): qty 1

## 2023-07-09 MED ORDER — ACETAMINOPHEN 325 MG PO TABS
650.0000 mg | ORAL_TABLET | Freq: Four times a day (QID) | ORAL | Status: DC
  Administered 2023-07-09 – 2023-07-11 (×8): 650 mg via ORAL
  Filled 2023-07-09 (×8): qty 2

## 2023-07-09 MED ORDER — INSULIN ASPART 100 UNIT/ML IJ SOLN: 4.0000 [IU] | Freq: Three times a day (TID) | INTRAMUSCULAR | Status: DC

## 2023-07-09 MED ORDER — ACETAMINOPHEN 650 MG RE SUPP
650.0000 mg | Freq: Four times a day (QID) | RECTAL | Status: DC
  Filled 2023-07-09: qty 1

## 2023-07-09 MED ORDER — ACYCLOVIR 400 MG PO TABS
400.0000 mg | ORAL_TABLET | Freq: Three times a day (TID) | ORAL | Status: DC
  Administered 2023-07-09 – 2023-07-11 (×6): 400 mg via ORAL
  Filled 2023-07-09 (×9): qty 1

## 2023-07-09 NOTE — Progress Notes (Signed)
 Peak flow perfomed x3. Best effort 225 LPM .

## 2023-07-09 NOTE — ED Notes (Signed)
 Admitting team at bedside.

## 2023-07-09 NOTE — ED Triage Notes (Signed)
 Pt to ED by EMS from home with c/o flu-like symptoms for the past week. Pt endorses fever, bodyaches and chills, has not taken a flu or covid test. VSS, NADN.

## 2023-07-09 NOTE — ED Provider Notes (Signed)
 [place] [facility]MENT AT [facility] Provider Note   CSN: [id number] Arrival date & time: 07/09/23  4098     History  No chief complaint on file.   Alexandria Meyer is a 53 y.o. female.  Patient with history of asthma, diabetes, hypertension presents today with complaints of shortness of breath. She states she has felt like she had a cold for 1 week now. She was watching her grandchild who was sick before this started. She has been taking tylenol  for her fevers and cough syrup that she got over the counter with minimal improvement. She endorses some tightness in her chest, no significant pain. No leg pain or leg swelling, recent travel, or recent surgeries. She is not on oxygen at baseline, she does not smoke. Does have asthma hx and has an inhaler, but sounds like she does not require it very often as she states "I completely forgot I had it so I didn't use it."  The history is provided by the patient. No language interpreter was used.       Home Medications Prior to Admission medications   Medication Sig Start Date End Date Taking? Authorizing Provider  ACCU-CHEK GUIDE test strip  12/13/21   [provider]  Accu-Chek Softclix Lancets lancets SMARTSIG:2 Topical Twice Daily 10/27/22   [provider]  acyclovir (ZOVIRAX) 400 MG tablet Take 400 mg by mouth 3 (three) times daily. 06/21/23   [provider]  acyclovir (ZOVIRAX) 800 MG tablet Take 800 mg by mouth 5 (five) times daily. 06/23/20   [provider]  adalimumab  (HUMIRA , 2 PEN,) 40 MG/0.4ML pen Inject 0.4 mLs (40 mg total) into the skin every 14 (fourteen) days. 1 kit - 2 pens 01/27/23   Rice, Haig Levan, MD  albuterol (VENTOLIN HFA) 108 (90 Base) MCG/ACT inhaler Inhale 2 puffs into the lungs every 6 (six) hours as needed for wheezing.    [provider]  Alcohol Swabs (ALCOHOL PREP) PADS     [provider]  atorvastatin (LIPITOR) 40 MG tablet      [provider]  betamethasone dipropionate 0.05 % cream Apply topically. 06/04/21   [provider]  calcipotriene (DOVONOX) 0.005 % cream Apply topically. 06/04/21   [provider]  cetirizine (ZYRTEC) 10 MG tablet     [provider]  Continuous Glucose Sensor (FREESTYLE LIBRE 2 PLUS SENSOR) MISC 1 Device by Does not apply route every 14 (fourteen) days. 03/08/23   Shamleffer, Ibtehal Jaralla, MD  dapagliflozin  propanediol (FARXIGA ) 10 MG TABS tablet Take 1 tablet (10 mg total) by mouth daily. 05/21/21   Shamleffer, Ibtehal Jaralla, MD  diclofenac Sodium (VOLTAREN) 1 % GEL Apply 2-4 g topically 3 (three) times daily. 07/07/20   [provider]  diphenhydrAMINE  HCl (ALLERGY MED PO) Take 1 tablet by mouth daily as needed (allergies).    [provider]  ezetimibe (ZETIA) 10 MG tablet Take 10 mg by mouth daily. 10/27/22   [provider]  famotidine (PEPCID) 20 MG tablet Take 20 mg by mouth 2 (two) times daily. 06/28/23   [provider]  fluticasone (FLONASE) 50 MCG/ACT nasal spray Place into both nostrils. 11/01/22   [provider]  furosemide (LASIX) 20 MG tablet     [provider]  gabapentin (NEURONTIN) 300 MG capsule Take 600 mg by mouth 3 (three) times daily.     [provider]  gabapentin (NEURONTIN) 800 MG tablet Take by mouth. 06/20/23   [provider]  insulin  aspart (NOVOLOG ) 100 unit/mL injection Inject into the skin. 02/13/13   [provider]  Insulin  Disposable Pump (OMNIPOD 5 LIBRE2 PLUS G6 PODS) MISC 1 Device by Does not apply route every other day. 03/08/23   Shamleffer, Ibtehal Jaralla, MD  Insulin  Disposable Pump (OMNIPOD 5 LIBRE2 PLUS G6) KIT 1 Device by Does not apply route every other day. 03/08/23   Shamleffer, Ibtehal Jaralla, MD  Insulin  Pen Needle (B-D UF III MINI PEN NEEDLES) 31G X 5 MM MISC 1 Device by Does not apply route in the morning, at noon, in the evening,  and at bedtime. 08/03/20   Shamleffer, Julian Obey, MD  LANTUS  SOLOSTAR 100 UNIT/ML Solostar Pen Inject 66 Units into the skin in the morning and at bedtime. 08/03/20   Shamleffer, Ibtehal Jaralla, MD  losartan (COZAAR) 100 MG tablet Take 1 tablet by mouth daily. 06/17/20   [provider]  methocarbamol  (ROBAXIN ) 500 MG tablet Take by mouth. 12/06/22   [provider]  methocarbamol  (ROBAXIN ) 750 MG tablet Take 750 mg by mouth 3 (three) times daily as needed for muscle spasms. 05/28/20   [provider]  metoprolol succinate (TOPROL-XL) 100 MG 24 hr tablet Take 100 mg by mouth daily. 12/13/21   [provider]  metoprolol succinate (TOPROL-XL) 50 MG 24 hr tablet Take 100 mg by mouth daily. 07/12/20   [provider]  metoprolol tartrate (LOPRESSOR) 100 MG tablet Take by mouth. 08/04/16   [provider]  metoprolol tartrate (LOPRESSOR) 25 MG tablet Take 1 tablet by mouth 2 (two) times daily. 12/09/13   [provider]  metoprolol tartrate (LOPRESSOR) 50 MG tablet     [provider]  naloxone (NARCAN) nasal spray 4 mg/0.1 mL Place 4 mg into the nose as needed. 01/15/20   [provider]  NOVOLOG  100 UNIT/ML injection Inject into the skin. 10/27/22   [provider]  omeprazole (PRILOSEC) 40 MG capsule Take 40 mg by mouth daily. 06/16/20   [provider]  ondansetron  (ZOFRAN -ODT) 8 MG disintegrating tablet Take by mouth. 06/28/23   [provider]  polyethylene glycol powder (GLYCOLAX/MIRALAX) 17 GM/SCOOP powder Take 17 g by mouth daily. 08/30/22   [provider]  rosuvastatin (CRESTOR) 40 MG tablet Take 40 mg by mouth daily. 06/16/20   [provider]  spironolactone-hydrochlorothiazide (ALDACTAZIDE) 25-25 MG tablet Take 1 tablet by mouth daily. 06/23/20   [provider]  sucralfate (CARAFATE) 1 g tablet Take 1 g by mouth 2 (two) times daily with a meal. 03/17/20   [provider]  traMADol (ULTRAM) 50 MG tablet Take 2 tablets 3 times a day by oral route for 30 days.    [provider]  traMADol HCl 100 MG TABS Take 2 tablets by mouth 2 (two) times daily. 08/03/21   [provider]      Allergies    Exenatide, Lisinopril, Penicillins, Canagliflozin, and Metformin    Review of Systems   Review of Systems  Respiratory:  Positive for cough and shortness of breath.   All other systems reviewed and are negative.   Physical Exam Updated Vital Signs BP 111/68   Pulse 87   Temp (!) 100.5 F (38.1 C) (Oral)   Resp 20   Wt 95.3 kg   LMP 06/28/2016   SpO2 99%   BMI 38.43 kg/m  Physical Exam Vitals and nursing note reviewed.  Constitutional:      General: She is not  in acute distress.    Appearance: Normal appearance. She is normal weight. She is not ill-appearing, toxic-appearing or diaphoretic.  HENT:     Head: Normocephalic and atraumatic.  Cardiovascular:     Rate and Rhythm: Normal rate and regular rhythm.     Heart sounds: Normal heart sounds.  Pulmonary:     Effort: Pulmonary effort is normal. No respiratory distress.     Breath sounds: Wheezing present.     Comments: Expiratory wheezing present at bilateral lung bases Abdominal:     General: Abdomen is flat.     Palpations: Abdomen is soft.     Tenderness: There is no abdominal tenderness.  Musculoskeletal:        General: No tenderness. Normal range of motion.     Cervical back: Normal range of motion.     Right lower leg: No edema.     Left lower leg: No edema.  Skin:    General: Skin is warm and dry.  Neurological:     General: No focal deficit present.     Mental Status: She is alert.  Psychiatric:        Mood and Affect: Mood normal.        Behavior: Behavior normal.     ED Results / Procedures / Treatments   Labs (all labs ordered are listed, but only abnormal results are displayed) Labs Reviewed  RESP PANEL BY RT-PCR (RSV, FLU A&B, COVID)   RVPGX2  CULTURE, BLOOD (ROUTINE X 2)  CULTURE, BLOOD (ROUTINE X 2)  COMPREHENSIVE METABOLIC PANEL WITH GFR  CBC WITH DIFFERENTIAL/PLATELET  I-STAT CG4 LACTIC ACID, ED    EKG None  Radiology No results found.  Procedures .Critical Care  Performed by: Sherra Dk, PA-C Authorized by: Rei Contee A, PA-C   Critical care provider statement:    Critical care time (minutes):  35   Critical care was necessary to treat or prevent imminent or life-threatening deterioration of the following conditions:  Respiratory failure   Critical care was time spent personally by me on the following activities:  Development of treatment plan with patient or surrogate, discussions with primary provider, evaluation of patient's response to treatment, examination of patient, obtaining history from patient or surrogate, ordering and review of laboratory studies, ordering and review of radiographic studies, pulse oximetry, re-evaluation of patient's condition and review of old charts   Care discussed with: admitting provider       Medications Ordered in ED Medications  ipratropium-albuterol (DUONEB) 0.5-2.5 (3) MG/3ML nebulizer solution 3 mL (has no administration in time range)  methylPREDNISolone  sodium succinate (SOLU-MEDROL ) 125 mg/2 mL injection 125 mg (125 mg Intravenous Given 07/09/23 0833)  traMADol (ULTRAM) tablet 50 mg (50 mg Oral Given 07/09/23 7829)    ED Course/ Medical Decision Making/ A&P                                 Medical Decision Making Amount and/or Complexity of Data Reviewed Labs: ordered. Radiology: ordered.  Risk Prescription drug management.   This patient is a 53 y.o. female who presents to the ED for concern of shortness of breath, this involves an extensive number of treatment options, and is a complaint that carries with it a high risk of complications and morbidity. The emergent differential diagnosis prior to evaluation includes, but is not limited to,  CHF,  pericardial effusion/tamponade, arrhythmias, ACS, COPD, asthma, bronchitis, pneumonia, pneumothorax, PE, anemia  This is not an exhaustive differential.   Past Medical History / Co-morbidities / Social History:  has a past medical history of Diabetes mellitus, Hypertension, Neuropathy in diabetes (HCC), Osteoarthritis, Psoriasis, Psoriatic arthritis (HCC), Sciatica, and Scoliosis.   Additional history: Chart reviewed.  Physical Exam: Physical exam performed. The pertinent findings include: expiratory wheezing present in bilateral lung bases, on 2L via [place], drops to low 80s on RA  Lab Tests: I ordered, and personally interpreted labs.  The pertinent results include:  RVP negative, no leukocytosis. Kidney function at baseline. Troponin WNL, lactic WNL   Imaging Studies: I ordered imaging studies including CXR. I independently visualized and interpreted imaging which showed   Low lung volumes. Bilateral perihilar interstitial opacities, which may reflect bronchovascular crowding or findings from underlying viral pneumonia.  I agree with the radiologist interpretation.   Cardiac Monitoring:  The patient was maintained on a cardiac monitor.  My attending physician Dr. Gordon Latus viewed and interpreted the cardiac monitored which showed an underlying rhythm of: sinus rhythm, no STEMI. I agree with this interpretation.   Medications: I ordered medication including duoneb x 3, solu-medrol   for wheezing, shortness of breath. Reevaluation of the patient after these medicines showed that the patient improved. I have reviewed the patients home medicines and have made adjustments as needed.  Disposition: After consideration of the diagnostic results and the patients response to treatment, I feel that patient will require admission for hypoxic respiratory failure, requiring 2L O2 via nasal cannula. No previous hx of O2 requirement. Was wheezing on exam which improved some with above interventions.  However, upon reassessment, patient continues to desat to the low 80s at rest. Will require admission. Likely asthma exacerbated by viral URI. No leukocytosis, will defer antibiotics at this time. Patient is understanding and in agreement with plan.  Discussed patient with hospitalist who accepts patient for admission.  I discussed this case with my attending physician Dr. Gordon Latus who cosigned this note including patient's presenting symptoms, physical exam, and planned diagnostics and interventions. Attending physician stated agreement with plan or made changes to plan which were implemented.    Final Clinical Impression(s) / ED Diagnoses Final diagnoses:  Acute respiratory failure with hypoxia [facility])    Rx / DC Orders ED Discharge Orders     None         Fredna Jasper 07/09/23 1610    Arvilla Birmingham, MD 07/09/23 1020

## 2023-07-09 NOTE — Hospital Course (Signed)
 Alexandria Meyer is a 53 y.o. woman with a history of asthma, HTN, DM who presented tom [facility] ED on 6/1 with shortness of breath and admitted for acute hypoxic respiratory failure due to asthma exacerbation. She was discharged to home in stable condition on 07/11/23.   Acute hypoxic respiratory failure Asthma exacerbation In the setting of metapneumovirus pneumonia Hypoxia on admission requiring 4 L quickly weaned to room air after her first night.  Afebrile, no suspicion of a bacterial infection.  Treated with steroids and DuoNebs as needed.  On room air and ambulating without desaturating during her first full day in the hospital.  Severe fatigue and a nasty cough kept her 1 more night for observation.  Continued improvement by 6/3, the morning of her discharge.  Stopped steroids, no longer needed.  Given some other asthma exacerbations, although no other hospitalizations, will provide her with an ICS/LABA inhaler Symbicort to start as maintenance therapy.  Hyperglycemia Type 2 diabetes A1c 8.1% in early 2025.  Was supposed to be on an insulin  pump with it was not working.  Hyperglycemia without ketoacidosis developed in setting of steroid treatment.  Given lack of effective pump, patient to be discharged with instructions for insulin .  Discharged regimen of 34 units of Lantus  daily with 6 units of aspart with meals.  Recommended quick follow-up with her primary care provider and attempt to move up her appointment with her endocrinologist.   HTN Takes losartan 100 mg and metoprolol succinate 100 mg at home. Did not need them in hospital. Held at discharge pending PCP fu.  Chronic conditions: GERD: omeprazole 40 mg daily, pepcid 20 mg BID Allergies: On flonase, zyrtec at home HLD: On zetia 10 mg, rosuvastatin 40 mg daily CKD3b: Cr 1.47, at baseline.  HSV: stop acyclovir 400 mg TID, pt no longer needs Chronic back pain: PDMP reviewed. Continued home tramadol.

## 2023-07-09 NOTE — Inpatient Diabetes Management (Signed)
 Inpatient Diabetes Program Recommendations  AACE/ADA: New Consensus Statement on Inpatient Glycemic Control (2015)  Target Ranges:  Prepandial:   less than 140 mg/dL      Peak postprandial:   less than 180 mg/dL (1-2 hours)      Critically ill patients:  140 - 180 mg/dL   Lab Results  Component Value Date   GLUCAP 312 (H) 07/09/2023   HGBA1C 8.1 (A) 03/08/2023    Review of Glycemic Control  Diabetes history: DM2 Outpatient Diabetes medications:  Farxiga  10 mg daily Omnipod  Pump   OmniPod Dash Settings   Insulin  type   NovoLog     Basal rate          0000 1.45                       I:C ratio          0000 1:15                              Sensitivity          0000  50         Goal          0000  100        Current orders for Inpatient glycemic control: Omnipod insulin  pump  Inpatient Diabetes Recommendations:   Spoke with patient via phone (on speaker with RN Sharlot Deal.-DM coordinator on weekend system wide call). Patient sees Dr. Rosalea Collin for endocrinology and last office visit notes was on 03-08-23. Janyce [facility], spoke to patient regarding insulin  pump training and review on 4-2.-25. Patient is currently wearing her insulin  pump but states she does not know how to input and count carbs nor enter her CBGs. Patient's CGM Libre 3 ran out and did not have another one to replace it with. Will provide patient with new Libre 3+ sensor (RN Chelsea to get from office for placement). Patient wants to keep insulin  pump on, but will depend on review in am. MD to order Novolog  4 units tid meal coverage and Novolog  0-6 units q 4 hrs. And leave insulin  pump on basal insulin  only for now. DM coordinator will plan to see pt. In am.   Thank you, Redford Behrle E. Lynzee Lindquist, RN, MSN, CDCES  Diabetes Coordinator Inpatient Glycemic Control Team Team Pager [phone] (8am-5pm) 07/09/2023 2:14 PM

## 2023-07-09 NOTE — H&P (Cosign Needed Addendum)
 Date: 07/09/2023               Patient Name:  Alexandria Meyer MRN: [id number]  DOB: Feb 11, 1970 Age / Sex: 53 y.o., female   PCP: Forestine Igo, NP         Medical Service: Internal Medicine Teaching Service         Attending Physician: Dr. Driscilla George, MD      First Contact: Dorthy Gavia, MD    Second Contact: Jearldine Mina, DO         After Hours (After 5p/  First Contact Pager: [phone]  weekends / holidays): Second Contact Pager: [phone]   SUBJECTIVE   Chief Complaint: shortness of breath  History of Present Illness: Alexandria Meyer is a 53 year old female with a PMH of asthma, diabetes, GERD, and HTN who presented to the emergency department with shortness of breath.  She says that she has not felt great for about a week, beginning around 5/25.  She reports subjective fever and chills, so she had been taking Tylenol  and cough syrup without much improvement.  She also began to feel short of breath.  She has a productive cough with greenish sputum. Had chest pain with coughing and tender to touch or moving. No radiation of chest pain. Denies nausea, vomiting, diarrhea.  She was exposed to a grandchild who apparently had a cold recently. Symptoms not improving so went to ED.   She does have a history of asthma but has been using her albuterol inhaler more frequently, and sounds like her asthma has been relatively well-controlled in the past.  Denies hospitalization for asthma.  Asthma triggers are cold and cats. Has a kitten at home and takes Benadryl  at times.  Denies tobacco use. Last asthma exacerbation was few months ago. She is not on any oxygen at home but is now on 2 L nasal cannula in the emergency department. Denies any previous hospitalizations for asthma exacerbations.   ED Course: Fever 100.5 Hypoxic 82%, on 4L [place] --> 2L  Normotensive, HR normal on arrival Chest x-ray with low lung volumes, bilateral perihilar interstitial opacities, possible viral  pneumonia Lactic 0.9 Blood cultures pending Troponin 13 Flu/covid/rsv negative  Meds: (did not take meds today) -acyclovir 400 TID  -Humira   -cetirizine 10 daily  -Farxiga  10 daily  -gabapentin 800 BID -losartan 100 daily  -metop 100 daily  -omeprazole 40 daily  -rosuvastatin 40 daily  -zetia 10 daily  -tramadol 50 mg QID PRN for back pain   Past Medical History: Past Medical History:  Diagnosis Date   Diabetes mellitus    Hypertension    Neuropathy in diabetes (HCC)    Osteoarthritis    Psoriasis    Psoriatic arthritis (HCC)    Sciatica    Scoliosis    Past Surgical History: Past Surgical History:  Procedure Laterality Date   BACK SURGERY  2014   CESAREAN SECTION     TUBAL LIGATION     Social:  Lives With: son  Support: family  Level of Function: independent with ADLs   PCP: Forestine Igo, NP Substances: -Tobacco: denies -Alcohol: denies -Recreational Drug: denies   Family History:  Family History  Problem Relation Age of Onset   Diabetes Mother    Diabetes Father    Allergies: Allergies as of 07/09/2023 - Review Complete 07/09/2023  Allergen Reaction Noted   Exenatide Nausea And Vomiting and Other (See Comments) 08/04/2016   Lisinopril Other (See Comments) 10/18/2017   Penicillins Itching, Rash,  and Hives 01/08/2011   Canagliflozin Other (See Comments) 08/22/2019   Metformin Diarrhea and Nausea And Vomiting 08/04/2016   Review of Systems: A complete ROS was negative except as per HPI.   OBJECTIVE:   Physical Exam: Blood pressure (!) 153/74, pulse 65, temperature 98.9 F (37.2 C), temperature source Oral, resp. rate 17, weight 95.3 kg, last menstrual period 06/28/2016, SpO2 99%.  Constitutional: Ill-appearing middle-aged female, laying in bed, looks like she feels poorly, no acute distress HENT: normocephalic atraumatic, mucous membranes moist Eyes: conjunctiva non-erythematous Cardiovascular: regular rate and rhythm, no murmurs appreciated,  euvolemic on exam Pulmonary/Chest: Lungs mostly clear to auscultation on exam, very mild wheezes in the lung bases, satting well on 2 L nasal cannula, intermittent dry cough Abdominal: soft, non-distended MSK: chest pain, worse with palpation and coughing, localized in midsternal region Neurological: alert & oriented x 3, no acute focal neurological deficits appreciated Skin: warm and dry, no rashes appreciated Psych: Normal mood and affect  Labs: CBC    Component Value Date/Time   WBC 7.6 07/09/2023 0805   RBC 4.50 07/09/2023 0805   HGB 11.5 (L) 07/09/2023 0805   HCT 38.3 07/09/2023 0805   PLT 159 07/09/2023 0805   MCV 85.1 07/09/2023 0805   MCH 25.6 (L) 07/09/2023 0805   MCHC 30.0 07/09/2023 0805   RDW 15.5 07/09/2023 0805   LYMPHSABS 1.6 07/09/2023 0805   MONOABS 1.0 07/09/2023 0805   EOSABS 0.0 07/09/2023 0805   BASOSABS 0.0 07/09/2023 0805    CMP     Component Value Date/Time   NA 140 07/09/2023 0805   K 3.8 07/09/2023 0805   CL 98 07/09/2023 0805   CO2 30 07/09/2023 0805   GLUCOSE 139 (H) 07/09/2023 0805   BUN 28 (H) 07/09/2023 0805   CREATININE 1.47 (H) 07/09/2023 0805   CREATININE 1.35 (H) 03/08/2023 1229   CALCIUM 9.0 07/09/2023 0805   PROT 6.5 07/09/2023 0805   ALBUMIN 3.0 (L) 07/09/2023 0805   AST 44 (H) 07/09/2023 0805   ALT 63 (H) 07/09/2023 0805   ALKPHOS 62 07/09/2023 0805   BILITOT 1.0 07/09/2023 0805   GFRNONAA 42 (L) 07/09/2023 0805   GFRAA >60 06/30/2016 0844   Imaging: DG Chest Port 1 View CLINICAL DATA:  Questionable sepsis - evaluate for abnormality  EXAM: PORTABLE CHEST - 1 VIEW  COMPARISON:  None available.  FINDINGS: Low lung volumes with elevation of the right hemidiaphragm. Bilateral perihilar interstitial opacities. No focal airspace consolidation, pleural effusion, or pneumothorax. Apparent nodular opacity in the right lung base, favored to represent artifact from overlapping bony and vascular structures. No cardiomegaly. No  acute fracture or destructive lesion. Multilevel thoracic osteophytosis.  IMPRESSION: Low lung volumes. Bilateral perihilar interstitial opacities, which may reflect bronchovascular crowding or findings from underlying viral pneumonia.  Electronically Signed   By: Rance Burrows M.D.   On: 07/09/2023 08:19  EKG: personally reviewed my interpretation is sinus rhythm, PVCs, no acute ischemia  ASSESSMENT & PLAN:  Assessment & Plan by Problem: Principal Problem:   Acute hypoxic respiratory failure (HCC)  Harlie NAYLAH CORK is a 53 y.o. person living with a history of asthma, HTN, DM who presented with shortness of breath and admitted for acute hypoxic respiratory failure on hospital day 0  Acute hypoxic respiratory failure History of asthma, suspect asthma exacerbation In the setting of likely viral pneumonia She has a history of mild asthma, uses albuterol as needed at home, has had to use it more frequently recently. She  was also febrile on admission with some chest x-ray interstitial markings concerning for possible viral pneumonia.  No leukocytosis, no focal consolidations on imaging, low suspicion for bacterial process at this time. She is doing better after solumedrol, breathing treatments. Suspect viral infection triggered asthma exacerbation, so we will treat accordingly and monitor for improvement. - Will get a full respiratory viral panel - Continue DuoNebs q4h prn - S/p solumedrol; will add prednisone 40 mg daily starting tomorrow - IV mag 2g - checking PEFR - on 2L [place]; wean O2 as tolerated, goal >92% on room air - ambulation with pulse ox - Tylenol  scheduled for fever, muscle aches - Mucinex prn  Chronic conditions: HTN: Hypertensive on presentation.  Takes losartan 100 mg and metoprolol succinate 100  mg at home. Did not take them this morning. BP intially hypertensive, now mildly hypotensive. Will hold these for now.  Type 2 diabetes: A1c 8.1% four months ago. Holding  Farxiga  for now. Uses an omnipod at home; consulted diabetes coordinator for assistance. Started novolog  4 units TID and SSI per diabetes coordinator recs. Will check CBG while in the hospital and adjust as needed. Patient reportedly is not correcting for mealtimes. Renally dosed gabapentin for neuropathy.  GERD: omeprazole 40 mg daily, pepcid 20 mg BID Allergies: On flonase, zyrtec at home HLD: On zetia 10 mg, rosuvastatin 40 mg daily CKD3b: Cr 1.47, at baseline.  HSV: continue acyclovir 400 mg TID Chronic back pain: PDMP reviewed. Continued home tramadol.   Diet: CM VTE: Enoxaparin IVF: None Code: Full Surrogate Decision Maker: daughter   Prior to Admission Living Arrangement: Home, living with son Anticipated Discharge Location: Home Barriers to Discharge: medical workup and return to baseline respiratory status  Dispo: Admit patient to Observation with expected length of stay less than 2 midnights.  Signed: Dorthy Gavia, MD Internal Medicine Resident, PGY-1 [facility]   07/09/2023, 10:47 AM

## 2023-07-09 NOTE — ED Notes (Signed)
 Pt with an RA sat of 82%, placed on 4lnc and is now 100%

## 2023-07-09 NOTE — Plan of Care (Signed)
   Problem: Education: Goal: Knowledge of General Education information will improve Description Including pain rating scale, medication(s)/side effects and non-pharmacologic comfort measures Outcome: Progressing

## 2023-07-10 ENCOUNTER — Other Ambulatory Visit (HOSPITAL_COMMUNITY): Payer: Self-pay

## 2023-07-10 DIAGNOSIS — J9601 Acute respiratory failure with hypoxia: Secondary | ICD-10-CM | POA: Diagnosis not present

## 2023-07-10 DIAGNOSIS — J4541 Moderate persistent asthma with (acute) exacerbation: Secondary | ICD-10-CM | POA: Diagnosis not present

## 2023-07-10 DIAGNOSIS — Z794 Long term (current) use of insulin: Secondary | ICD-10-CM | POA: Diagnosis not present

## 2023-07-10 DIAGNOSIS — E1165 Type 2 diabetes mellitus with hyperglycemia: Secondary | ICD-10-CM

## 2023-07-10 LAB — CBC
HCT: 37.9 % (ref 36.0–46.0)
Hemoglobin: 11.7 g/dL — ABNORMAL LOW (ref 12.0–15.0)
MCH: 25.8 pg — ABNORMAL LOW (ref 26.0–34.0)
MCHC: 30.9 g/dL (ref 30.0–36.0)
MCV: 83.5 fL (ref 80.0–100.0)
Platelets: 183 10*3/uL (ref 150–400)
RBC: 4.54 MIL/uL (ref 3.87–5.11)
RDW: 15 % (ref 11.5–15.5)
WBC: 7.9 10*3/uL (ref 4.0–10.5)
nRBC: 0 % (ref 0.0–0.2)

## 2023-07-10 LAB — GLUCOSE, CAPILLARY
Glucose-Capillary: 286 mg/dL — ABNORMAL HIGH (ref 70–99)
Glucose-Capillary: 308 mg/dL — ABNORMAL HIGH (ref 70–99)
Glucose-Capillary: 312 mg/dL — ABNORMAL HIGH (ref 70–99)
Glucose-Capillary: 343 mg/dL — ABNORMAL HIGH (ref 70–99)
Glucose-Capillary: 371 mg/dL — ABNORMAL HIGH (ref 70–99)
Glucose-Capillary: 398 mg/dL — ABNORMAL HIGH (ref 70–99)

## 2023-07-10 LAB — BASIC METABOLIC PANEL WITH GFR
Anion gap: 13 (ref 5–15)
BUN: 32 mg/dL — ABNORMAL HIGH (ref 6–20)
CO2: 27 mmol/L (ref 22–32)
Calcium: 8.9 mg/dL (ref 8.9–10.3)
Chloride: 98 mmol/L (ref 98–111)
Creatinine, Ser: 1.42 mg/dL — ABNORMAL HIGH (ref 0.44–1.00)
GFR, Estimated: 44 mL/min — ABNORMAL LOW (ref >60)
Glucose, Bld: 327 mg/dL — ABNORMAL HIGH (ref 70–99)
Potassium: 4.1 mmol/L (ref 3.5–5.1)
Sodium: 138 mmol/L (ref 135–145)

## 2023-07-10 MED ORDER — INSULIN GLARGINE-YFGN 100 UNIT/ML ~~LOC~~ SOPN: 34.0000 [IU] | PEN_INJECTOR | SUBCUTANEOUS | Status: DC

## 2023-07-10 MED ORDER — INSULIN GLARGINE-YFGN 100 UNIT/ML ~~LOC~~ SOLN
34.0000 [IU] | Freq: Every day | SUBCUTANEOUS | Status: DC
  Administered 2023-07-10: 34 [IU] via SUBCUTANEOUS
  Filled 2023-07-10 (×2): qty 0.34

## 2023-07-10 MED ORDER — INSULIN ASPART 100 UNIT/ML IJ SOLN
0.0000 [IU] | INTRAMUSCULAR | Status: DC
  Administered 2023-07-10: 8 [IU] via SUBCUTANEOUS
  Administered 2023-07-10: 15 [IU] via SUBCUTANEOUS
  Administered 2023-07-10: 11 [IU] via SUBCUTANEOUS
  Administered 2023-07-10: 8 [IU] via SUBCUTANEOUS
  Administered 2023-07-11: 11 [IU] via SUBCUTANEOUS
  Administered 2023-07-11: 5 [IU] via SUBCUTANEOUS
  Administered 2023-07-11 (×2): 3 [IU] via SUBCUTANEOUS

## 2023-07-10 NOTE — Progress Notes (Signed)
 HD#0 SUBJECTIVE:  Patient Summary: Alexandria Meyer is a 53 y.o. with a pertinent PMH of mild asthma, diabetes, GERD, and HTN , who presented with dyspnea and admitted for AHRF due to asthma exacerbation.  Overnight Events and Interim History: States her breathing, dyspnea is much better but she feels very tired and is coughing much. Eating breakfast.    OBJECTIVE:  Vital Signs: Vitals:   07/10/23 0059 07/10/23 0424 07/10/23 0750 07/10/23 1255  BP: 131/81 (!) 169/74 126/66   Pulse: 91 80 81   Resp: 18 17 18    Temp: 98.1 F (36.7 C) 98.5 F (36.9 C) 98.2 F (36.8 C)   TempSrc: Oral Oral Oral   SpO2: 99% 99% 95% 90%  Weight:      Height:       Supplemental O2: Room Air SpO2: 90 % O2 Flow Rate (L/min): 0 L/min  Filed Weights   07/09/23 0635  Weight: 95.3 kg     Intake/Output Summary (Last 24 hours) at 07/10/2023 1432 Last data filed at 07/10/2023 0447 Gross per 24 hour  Intake 450 ml  Output --  Net 450 ml   Net IO Since Admission: 450 mL [07/10/23 1432]  Physical Exam: Physical Exam Constitutional:      General: She is not in acute distress.    Appearance: She is not ill-appearing.     Comments: Appears very tired.  Cardiovascular:     Rate and Rhythm: Normal rate.  Pulmonary:     Effort: Pulmonary effort is normal. No respiratory distress.     Breath sounds: Normal breath sounds. No wheezing.     Comments: Reffered congestion from upper airways. Frequent coughing limits air flow. Not on oxygen. Abdominal:     General: Abdomen is flat.  Musculoskeletal:     Right lower leg: No edema.     Left lower leg: No edema.  Skin:    General: Skin is warm and dry.  Neurological:     Mental Status: She is alert.    Patient Lines/Drains/Airways Status     Active Line/Drains/Airways     Name Placement date Placement time Site Days   Peripheral IV 07/09/23 20 G 1.88" Left;Anterior Forearm 07/09/23  1450  Forearm  1   Wound Thigh Right abscess to right thigh  marked this am with skin marker by Milford Allegra, PA - area remains the same - no increase in size --  --  Thigh  --            ASSESSMENT/PLAN:  Assessment: Principal Problem:   Acute hypoxic respiratory failure (HCC)  Alexandria Meyer is a 53 y.o. woman with a history of asthma, HTN, DM who presented with shortness of breath and admitted for acute hypoxic respiratory failure.   Acute hypoxic respiratory failure (resolved) 2/2 Asthma exacerbation In the setting of viral pneumonia She is no longer hypoxic, having weaned fully this am. She ambulated today with saturations no lower than 90%. She has an appetite. However she continues to feel malaise and has a significant non-productive cough exacerbated with speech. Given this she is hesitant to go home, understandable. Will continue current treatment, observe one more night, and be hopeful for further recovery and discharge tomorrow. - Confirmed metapneumovirus + - Peak flow 225 - Continue DuoNebs q4h prn - Cont prednisone 40 mg daily starting today, day 1/4 - off O2 - Tylenol  scheduled for fever, muscle aches - Mucinex prn - Would benefit from ICS/LABA. Anticipate Symbicort at discharge  Hyperglycemia Type 2 diabetes A1c 8.1% four months ago. Uses an omnipod at home but not currently active, not sure how to work it. Will look into this but avoid its use for now, instead giving insulin . Steroid treatment is worsening hyperglycemia. Renally dosed gabapentin for neuropathy.  - DM coordinators following, thank you - 34u semglee daily - 4u aspart TID with meals and SSI moderate - OP follow up with Dr. Rosalea Collin  Chronic conditions: HTN: Hypertensive on presentation.  Takes losartan 100 mg and metoprolol succinate 100  mg at home. Held at admission given hypotension. Will continue to hold.  GERD: omeprazole 40 mg daily, pepcid 20 mg BID Allergies: On flonase, zyrtec at home HLD: On zetia 10 mg, rosuvastatin 40 mg daily CKD3b: Cr  1.47, at baseline.  HSV: continue acyclovir 400 mg TID Chronic back pain: PDMP reviewed. Continued home tramadol.   Best Practice: Diet: Renal diet IVF: None VTE: enoxaparin (LOVENOX) injection 40 mg Start: 07/09/23 1100 Code: Full AB: None Therapy Recs: None DISPO: Anticipated discharge tomorrow to Home pending recovery.  Signature: Carleen Chary, D.O.  Internal Medicine Resident, PGY-1 [facility]  Pager: # [phone]. 2:32 PM, 07/10/2023

## 2023-07-10 NOTE — Inpatient Diabetes Management (Signed)
 Inpatient Diabetes Program Recommendations  AACE/ADA: New Consensus Statement on Inpatient Glycemic Control (2015)  Target Ranges:  Prepandial:   less than 140 mg/dL      Peak postprandial:   less than 180 mg/dL (1-2 hours)      Critically ill patients:  140 - 180 mg/dL   Lab Results  Component Value Date   GLUCAP 286 (H) 07/10/2023   HGBA1C 8.1 (A) 03/08/2023    Diabetes history: DM2 Outpatient Diabetes medications:  Farxiga  10 mg daily Omnipod  Pump   OmniPod Dash Settings   Insulin  type   NovoLog     Basal rate          0000 1.45                       I:C ratio          0000 1:15                              Sensitivity          0000  50         Goal          0000  100        Current orders for Inpatient glycemic control: Omnipod insulin  pump, Novolog  4 units tid meal coverage, Novolog  0-15 units q 4 hrs. Correction Prednisone 40 mg daily  Spoke with patient @ bedside. Sent [place] message to [facility] regarding patient needs for additional education regarding insulin  pump management. Until patient is educated further on insulin  pump management, please consider: -D/C Insulin  pump -Semglee 34 units daily -Novolog  6 units tid meal coverage if eats 50% -Novolog  0-15 units q 4 hrs. Correction while on steroids  Will need followup with Dr. Rosalea Collin.  Thank you, Deonne Rooks E. Jilliann Subramanian, RN, MSN, CDCES  Diabetes Coordinator Inpatient Glycemic Control Team Team Pager [phone] (8am-5pm) 07/10/2023 1:25 PM '

## 2023-07-10 NOTE — Plan of Care (Signed)

## 2023-07-11 ENCOUNTER — Other Ambulatory Visit (HOSPITAL_COMMUNITY): Payer: Self-pay

## 2023-07-11 DIAGNOSIS — E1165 Type 2 diabetes mellitus with hyperglycemia: Secondary | ICD-10-CM | POA: Diagnosis not present

## 2023-07-11 DIAGNOSIS — J4541 Moderate persistent asthma with (acute) exacerbation: Secondary | ICD-10-CM | POA: Diagnosis not present

## 2023-07-11 DIAGNOSIS — J9601 Acute respiratory failure with hypoxia: Secondary | ICD-10-CM | POA: Diagnosis not present

## 2023-07-11 DIAGNOSIS — Z794 Long term (current) use of insulin: Secondary | ICD-10-CM | POA: Diagnosis not present

## 2023-07-11 LAB — BASIC METABOLIC PANEL WITH GFR
Anion gap: 7 (ref 5–15)
BUN: 46 mg/dL — ABNORMAL HIGH (ref 6–20)
CO2: 30 mmol/L (ref 22–32)
Calcium: 9.1 mg/dL (ref 8.9–10.3)
Chloride: 104 mmol/L (ref 98–111)
Creatinine, Ser: 1.56 mg/dL — ABNORMAL HIGH (ref 0.44–1.00)
GFR, Estimated: 39 mL/min — ABNORMAL LOW (ref >60)
Glucose, Bld: 210 mg/dL — ABNORMAL HIGH (ref 70–99)
Potassium: 3.9 mmol/L (ref 3.5–5.1)
Sodium: 141 mmol/L (ref 135–145)

## 2023-07-11 LAB — GLUCOSE, CAPILLARY
Glucose-Capillary: 190 mg/dL — ABNORMAL HIGH (ref 70–99)
Glucose-Capillary: 195 mg/dL — ABNORMAL HIGH (ref 70–99)
Glucose-Capillary: 203 mg/dL — ABNORMAL HIGH (ref 70–99)
Glucose-Capillary: 331 mg/dL — ABNORMAL HIGH (ref 70–99)

## 2023-07-11 MED ORDER — NOVOLOG FLEXPEN 100 UNIT/ML ~~LOC~~ SOPN
6.0000 [IU] | PEN_INJECTOR | Freq: Three times a day (TID) | SUBCUTANEOUS | 11 refills | Status: AC
  Filled 2023-07-11: qty 6, 33d supply, fill #0

## 2023-07-11 MED ORDER — LANTUS SOLOSTAR 100 UNIT/ML ~~LOC~~ SOPN
40.0000 [IU] | PEN_INJECTOR | Freq: Every day | SUBCUTANEOUS | 11 refills | Status: AC
Start: 2023-07-11 — End: ?
  Filled 2023-07-11: qty 12, 30d supply, fill #0

## 2023-07-11 MED ORDER — SENNOSIDES-DOCUSATE SODIUM 8.6-50 MG PO TABS
1.0000 | ORAL_TABLET | Freq: Every evening | ORAL | 0 refills | Status: AC | PRN
  Filled 2023-07-11: qty 30, 30d supply, fill #0

## 2023-07-11 MED ORDER — GABAPENTIN 400 MG PO CAPS
400.0000 mg | ORAL_CAPSULE | Freq: Two times a day (BID) | ORAL | 2 refills | Status: AC
  Filled 2023-07-11: qty 60, 30d supply, fill #0

## 2023-07-11 MED ORDER — BUDESONIDE-FORMOTEROL FUMARATE 80-4.5 MCG/ACT IN AERO
2.0000 | INHALATION_SPRAY | Freq: Two times a day (BID) | RESPIRATORY_TRACT | 12 refills | Status: AC
  Filled 2023-07-11: qty 10.2, 60d supply, fill #0

## 2023-07-11 NOTE — Discharge Summary (Signed)
 Name: Alexandria Meyer MRN: [id number] DOB: 07-09-1970 53 y.o. PCP: Tann, Samandra, NP  Date of Admission: 07/09/2023  6:31 AM Date of Discharge:  07/11/23 Attending Physician: Dr. Jarvis Mesa  DISCHARGE DIAGNOSIS:  Primary Problem:  Acute hypoxic respiratory failure (HCC)  Due to Asthma exacerbation  Hospital Problems: Principal Problem:   Acute hypoxic respiratory failure (HCC)  DISCHARGE MEDICATIONS:   Allergies as of 07/11/2023       Reactions   Exenatide Nausea And Vomiting, Other (See Comments)   Pancreatitis history   Lisinopril Other (See Comments)   Cough, Pancreatitis   Penicillins Hives, Itching, Rash   Canagliflozin Other (See Comments)   Yeast infections   Metformin Diarrhea, Nausea And Vomiting   Caused hospitalization        Medication List     PAUSE taking these medications    losartan 100 MG tablet Wait to take this until your doctor or other care provider tells you to start again. Commonly known as: COZAAR Take 1 tablet by mouth daily.   metoprolol succinate 100 MG 24 hr tablet Wait to take this until your doctor or other care provider tells you to start again. Commonly known as: TOPROL-XL Take 100 mg by mouth daily.   Omnipod 5 Libre2 Plus G6 Pods Misc Wait to take this until your doctor or other care provider tells you to start again. 1 Device by Does not apply route every other day.       STOP taking these medications    acyclovir 400 MG tablet Commonly known as: ZOVIRAX   gabapentin 800 MG tablet Commonly known as: NEURONTIN Replaced by: gabapentin 400 MG capsule   insulin  aspart 100 unit/mL injection Commonly known as: novoLOG    NovoLOG  100 UNIT/ML injection Generic drug: insulin  aspart Replaced by: NovoLOG  FlexPen 100 UNIT/ML FlexPen       TAKE these medications    albuterol 108 (90 Base) MCG/ACT inhaler Commonly known as: VENTOLIN HFA Inhale 2 puffs into the lungs every 6 (six) hours as needed for wheezing.   B-D UF  III MINI PEN NEEDLES 31G X 5 MM Misc Generic drug: Insulin  Pen Needle 1 Device by Does not apply route in the morning, at noon, in the evening, and at bedtime.   budesonide-formoterol 80-4.5 MCG/ACT inhaler Commonly known as: Symbicort Inhale 2 puffs into the lungs in the morning and at bedtime.   cetirizine 10 MG tablet Commonly known as: ZYRTEC Take 10 mg by mouth daily.   CORICIDIN HBP COLD/FLU PO Take 1 Dose by mouth as needed.   dapagliflozin  propanediol 10 MG Tabs tablet Commonly known as: FARXIGA  Take 1 tablet (10 mg total) by mouth daily.   ezetimibe 10 MG tablet Commonly known as: ZETIA Take 10 mg by mouth daily.   famotidine 20 MG tablet Commonly known as: PEPCID Take 20 mg by mouth 2 (two) times daily.   fluticasone 50 MCG/ACT nasal spray Commonly known as: FLONASE Place into both nostrils.   FreeStyle Libre 2 Plus Sensor Misc 1 Device by Does not apply route every 14 (fourteen) days.   gabapentin 400 MG capsule Commonly known as: NEURONTIN Take 1 capsule (400 mg total) by mouth 2 (two) times daily. Replaces: gabapentin 800 MG tablet   Humira  (2 Pen) 40 MG/0.4ML pen Generic drug: adalimumab  Inject 0.4 mLs (40 mg total) into the skin every 14 (fourteen) days. 1 kit - 2 pens   Lantus  SoloStar 100 UNIT/ML Solostar Pen Generic drug: insulin  glargine Inject 40 Units into the  skin daily. What changed:  how much to take when to take this   naloxone 4 MG/0.1ML Liqd nasal spray kit Commonly known as: NARCAN Place 4 mg into the nose as needed.   NovoLOG  FlexPen 100 UNIT/ML FlexPen Generic drug: insulin  aspart Inject 6 Units into the skin 3 (three) times daily with meals. Replaces: NovoLOG  100 UNIT/ML injection   omeprazole 40 MG capsule Commonly known as: PRILOSEC Take 40 mg by mouth daily.   ondansetron  8 MG disintegrating tablet Commonly known as: ZOFRAN -ODT Take by mouth.   rosuvastatin 40 MG tablet Commonly known as: CRESTOR Take 40 mg by  mouth daily.   senna-docusate 8.6-50 MG tablet Commonly known as: Senokot-S Take 1 tablet by mouth at bedtime as needed for mild constipation.   traMADol 50 MG tablet Commonly known as: ULTRAM Take 50 mg by mouth every 6 (six) hours as needed for moderate pain (pain score 4-6) (back pain).        DISPOSITION AND FOLLOW-UP:  [name] was discharged from [facility] in stable condition. At the hospital follow up visit please address:  Follow-up Recommendations: Labs: CBC, BMP Medications: Please review: Symbicort: new asthma maintenance HTN medicines: losartan 100 and metoprolol 100 held at discharge as not needed in hospital Insulin  Lantus  40u daily and aspart 6u with meals pending return to insulin  pump Stopped acyclovir: still prescribed but appears was intended for a short course already completed  Follow-up Appointments: Pt has called PCP for visit and I have asked her to call her endocrinologist.  HOSPITAL COURSE:  Patient Summary: Alexandria Meyer is a 53 y.o. woman with a history of asthma, HTN, DM who presented tom [facility] ED on 6/1 with shortness of breath and admitted for acute hypoxic respiratory failure due to asthma exacerbation. She was discharged to home in stable condition on 07/11/23.   Acute hypoxic respiratory failure Asthma exacerbation In the setting of metapneumovirus pneumonia Hypoxia on admission requiring 4 L quickly weaned to room air after her first night.  Afebrile, no suspicion of a bacterial infection.  Treated with steroids and DuoNebs as needed.  On room air and ambulating without desaturating during her first full day in the hospital.  Severe fatigue and a nasty cough kept her 1 more night for observation.  Continued improvement by 6/3, the morning of her discharge.  Stopped steroids, no longer needed.  Given some other asthma exacerbations, although no other hospitalizations, will provide her with an ICS/LABA inhaler Symbicort  to start as maintenance therapy.  Hyperglycemia Type 2 diabetes A1c 8.1% in early 2025.  Was supposed to be on an insulin  pump with it was not working.  Hyperglycemia without ketoacidosis developed in setting of steroid treatment.  Given lack of effective pump, patient to be discharged with instructions for insulin .  Discharged regimen of 34 units of Lantus  daily with 6 units of aspart with meals.  Recommended quick follow-up with her primary care provider and attempt to move up her appointment with her endocrinologist.   HTN Takes losartan 100 mg and metoprolol succinate 100 mg at home. Did not need them in hospital. Held at discharge pending PCP fu.  Chronic conditions: GERD: omeprazole 40 mg daily, pepcid 20 mg BID Allergies: On flonase, zyrtec at home HLD: On zetia 10 mg, rosuvastatin 40 mg daily CKD3b: Cr 1.47, at baseline.  HSV: stop acyclovir 400 mg TID, pt no longer needs Chronic back pain: PDMP reviewed. Continued home tramadol.   DISCHARGE INSTRUCTIONS:  Discharge Instructions     Call MD for:  difficulty breathing, headache or visual disturbances   Complete by: As directed    Call MD for:  persistant dizziness or light-headedness   Complete by: As directed    Diet - low sodium heart healthy   Complete by: As directed    Discharge instructions   Complete by: As directed    Medicine changes: - Start Symbicort inhaler 2 puffs twice daily. Do this daily. Continue to use albuterol as a rescue inhaler. - Take insulin : 40 units of long-acting Lantus  daily and 6 units of short-acting Novolog  with meals. Continue to check your sugars regularly. If sugar is below 70, consume juice or soda to increase. Please call endocrinologist and request an earlier appointment. - Reduce gabapentin to 400mg  twice daily as needed for pain in light of kidney disease. - Take senna-docusate if you take tramadol pain medicine to prevent constipation. - Pause losartan and metoprolol, these are blood  pressure medicines that you did not need in hospital.  Please set up an after-hospital visit with your primary doctor. Please request an earlier appointment with your endocrinologist to work out your insulin  pump.   Increase activity slowly   Complete by: As directed        SUBJECTIVE:  Feels further improved this am. Still no SOB. Ambulating ok. Cough remains, worsens with speech, not productive. No fevers/chills. Agreeable to a new inhaler and understands insulin  regimen.   Discharge Vitals:   BP (!) 144/74 (BP Location: Right Arm)   Pulse 92   Temp 98.4 F (36.9 C)   Resp 17   Ht 5\' 2"  (1.575 m)   Wt 95.3 kg   LMP 06/28/2016   SpO2 93%   BMI 38.43 kg/m   OBJECTIVE:  Physical Exam Constitutional:      General: She is not in acute distress.    Appearance: She is obese. She is not ill-appearing.     Comments: Restful.  Cardiovascular:     Rate and Rhythm: Normal rate and regular rhythm.  Pulmonary:     Effort: Pulmonary effort is normal.     Breath sounds: No stridor. No wheezing, rhonchi or rales.  Abdominal:     General: Abdomen is flat.  Musculoskeletal:     Right lower leg: No edema.     Left lower leg: No edema.  Skin:    General: Skin is warm and dry.  Neurological:     Mental Status: She is alert.  Psychiatric:        Mood and Affect: Mood normal.        Behavior: Behavior normal.     Pertinent Labs, Studies, and Procedures:     Latest Ref Rng & Units 07/10/2023    5:43 AM 07/09/2023    8:05 AM 12/19/2022   11:01 AM  CBC  WBC 4.0 - 10.5 K/uL 7.9  7.6  9.2   Hemoglobin 12.0 - 15.0 g/dL 19.1  47.8  29.5   Hematocrit 36.0 - 46.0 % 37.9  38.3  39.8   Platelets 150 - 400 K/uL 183  159  200        Latest Ref Rng & Units 07/11/2023    5:23 AM 07/10/2023    5:43 AM 07/09/2023    8:05 AM  CMP  Glucose 70 - 99 mg/dL 621  308  657   BUN 6 - 20 mg/dL 46  32  28   Creatinine 0.44 - 1.00 mg/dL 8.46  1.42  1.47   Sodium 135 - 145 mmol/L 141  138  140    Potassium 3.5 - 5.1 mmol/L 3.9  4.1  3.8   Chloride 98 - 111 mmol/L 104  98  98   CO2 22 - 32 mmol/L 30  27  30    Calcium 8.9 - 10.3 mg/dL 9.1  8.9  9.0   Total Protein 6.5 - 8.1 g/dL   6.5   Total Bilirubin 0.0 - 1.2 mg/dL   1.0   Alkaline Phos 38 - 126 U/L   62   AST 15 - 41 U/L   44   ALT 0 - 44 U/L   63    DG Chest Port 1 View Result Date: 07/09/2023 CLINICAL DATA:  Questionable sepsis - evaluate for abnormality EXAM: PORTABLE CHEST - 1 VIEW COMPARISON:  None available. FINDINGS: Low lung volumes with elevation of the right hemidiaphragm. Bilateral perihilar interstitial opacities. No focal airspace consolidation, pleural effusion, or pneumothorax. Apparent nodular opacity in the right lung base, favored to represent artifact from overlapping bony and vascular structures. No cardiomegaly. No acute fracture or destructive lesion. Multilevel thoracic osteophytosis. IMPRESSION: Low lung volumes. Bilateral perihilar interstitial opacities, which may reflect bronchovascular crowding or findings from underlying viral pneumonia. Electronically Signed   By: Rance Burrows M.D.   On: 07/09/2023 08:19    Signed: Carleen Chary, DO Internal Medicine Resident, PGY-1 [facility]  1:31 PM, 07/11/2023

## 2023-07-11 NOTE — Inpatient Diabetes Management (Signed)
 Inpatient Diabetes Program Recommendations  AACE/ADA: New Consensus Statement on Inpatient Glycemic Control (2015)  Target Ranges:  Prepandial:   less than 140 mg/dL      Peak postprandial:   less than 180 mg/dL (1-2 hours)      Critically ill patients:  140 - 180 mg/dL   Lab Results  Component Value Date   GLUCAP 203 (H) 07/11/2023   HGBA1C 8.1 (A) 03/08/2023    Review of Glycemic Control  Latest Reference Range & Units 07/10/23 15:55 07/10/23 21:26 07/11/23 00:20 07/11/23 05:22 07/11/23 08:11  Glucose-Capillary 70 - 99 mg/dL 147 (H) 829 (H) 562 (H) 190 (H) 203 (H)   Diabetes history: DM 2 Outpatient Diabetes medications:  Omnipod insulin  pump- Current orders for Inpatient glycemic control:  Semglee 34 units daily Novolog  0-15 units q 4 hours Novolog  4 units tid with meals  Inpatient Diabetes Program Recommendations:    Consider increasing Novolog  to 8 units tid with meals (hold if patient eats less than 50% or NPO).   Thanks,  Josefa Ni, RN, BC-ADM Inpatient Diabetes Coordinator Pager [phone]  (8a-5p)

## 2023-07-11 NOTE — Plan of Care (Signed)

## 2023-07-14 ENCOUNTER — Other Ambulatory Visit: Payer: Self-pay

## 2023-07-14 LAB — CULTURE, BLOOD (ROUTINE X 2)
Culture: NO GROWTH
Culture: NO GROWTH
Special Requests: ADEQUATE

## 2023-08-01 ENCOUNTER — Other Ambulatory Visit: Payer: Self-pay

## 2023-08-01 MED ORDER — DEXCOM G7 SENSOR MISC
1.0000 | 0 refills | Status: DC
Start: 2023-08-01 — End: 2023-12-04

## 2023-08-01 NOTE — Telephone Encounter (Signed)
 Patient requested to change to the Dexcom G7 because insurance will not cover the [place] anymore. Prescription has been sent to pharmacy for the sensors.

## 2023-08-10 ENCOUNTER — Other Ambulatory Visit: Payer: Self-pay

## 2023-08-10 ENCOUNTER — Telehealth: Payer: Self-pay | Admitting: Pharmacy Technician

## 2023-08-10 ENCOUNTER — Other Ambulatory Visit (HOSPITAL_COMMUNITY): Payer: Self-pay

## 2023-08-10 MED ORDER — OMNIPOD 5 DEXG7G6 PODS GEN 5 MISC: 3 refills | Status: DC

## 2023-08-10 NOTE — Telephone Encounter (Signed)
 Pharmacy Patient Advocate Encounter  Received notification from [facility] that Prior Authorization for Dexcom G7 Sensor has been DENIED.  Full denial letter will be uploaded to the media tab. See denial reason below.    PA #/Case ID/Reference #: [id number]

## 2023-08-10 NOTE — Telephone Encounter (Signed)
 Pharmacy Patient Advocate Encounter   Received notification from CoverMyMeds that prior authorization for Dexcom G7 Sensor is required/requested.   Insurance verification completed.   The patient is insured through [facility] .   Per test claim: PA required; PA submitted to above mentioned insurance via CoverMyMeds Key/confirmation #/EOC [facility] Status is pending

## 2023-08-14 ENCOUNTER — Telehealth: Payer: Self-pay | Admitting: Pharmacy Technician

## 2023-08-14 ENCOUNTER — Other Ambulatory Visit (HOSPITAL_COMMUNITY): Payer: Self-pay

## 2023-08-14 NOTE — Telephone Encounter (Signed)
 Pharmacy Patient Advocate Encounter   Received notification from Pt Calls Messages that prior authorization for Dexcom G7 Sensor is required/requested.   Insurance verification completed.   The patient is insured through [facility] .   Per test claim: PA required; PA submitted to above mentioned insurance via CoverMyMeds Key/confirmation #/EOC [id number] Status is pending   **Second attempt**

## 2023-08-14 NOTE — Telephone Encounter (Signed)
 Pharmacy Patient Advocate Encounter  Received notification from [facility] that Prior Authorization for Dexcom G7 Sensor has been APPROVED from 08/14/23 to 02/10/24. Ran test claim, Copay is $0.00. This test claim was processed through [facility]- copay amounts may vary at other pharmacies due to pharmacy/plan contracts, or as the patient moves through the different stages of their insurance plan.   PA #/Case ID/Reference #: [id number]

## 2023-08-14 NOTE — Telephone Encounter (Signed)
 Yes I can! I had used the most recent chart notes when I submitted it the first time. Please provide the report that you would like for me to use so that I can try again.

## 2023-08-15 NOTE — Telephone Encounter (Signed)
 Patient notified

## 2023-08-16 ENCOUNTER — Other Ambulatory Visit: Payer: Self-pay

## 2023-08-16 ENCOUNTER — Telehealth: Payer: Self-pay | Admitting: Internal Medicine

## 2023-08-16 NOTE — Telephone Encounter (Signed)
 MEDICATION: Omnipod 5 Pump to replace the Omnipod Dash  PHARMACY:  Ptient states that Omnipod rep has to be called so they can say where prescription is sent in to.  HAS THE PATIENT CONTACTED THEIR PHARMACY?  Yes  IS THIS A 90 DAY SUPPLY : This would be a new prescription  IS PATIENT OUT OF MEDICATION:   IF NOT; HOW MUCH IS LEFT:   LAST APPOINTMENT DATE: @1 /29/2025  NEXT APPOINTMENT DATE:@7 /29/2025  DO WE HAVE YOUR PERMISSION TO LEAVE A DETAILED MESSAGE?:Yes OTHER COMMENTS:   Patient wants to speak with someone to make sure the prescription is sent in to the correct place.   **Let patient know to contact pharmacy at the end of the day to make sure medication is ready. **  ** Please notify patient to allow 48-72 hours to process**  **Encourage patient to contact the pharmacy for refills or they can request refills through [facility]**

## 2023-08-17 MED ORDER — OMNIPOD 5 DEXG7G6 INTRO GEN 5 KIT: PACK | 0 refills | Status: DC

## 2023-08-17 NOTE — Telephone Encounter (Signed)
 Prescription has been sent.

## 2023-08-18 ENCOUNTER — Other Ambulatory Visit: Payer: Self-pay

## 2023-08-18 MED ORDER — OMNIPOD 5 DEXG7G6 INTRO GEN 5 KIT: PACK | 0 refills | Status: AC

## 2023-08-21 ENCOUNTER — Other Ambulatory Visit (HOSPITAL_COMMUNITY): Payer: Self-pay

## 2023-08-21 ENCOUNTER — Telehealth: Payer: Self-pay

## 2023-08-21 NOTE — Telephone Encounter (Signed)
 Omnipod Kit needs PA

## 2023-08-21 NOTE — Telephone Encounter (Signed)
 Pharmacy Patient Advocate Encounter   Received notification from Pt Calls Messages that prior authorization for Omnipod Kit is required/requested.   Insurance verification completed.   The patient is insured through [facility] .   Per test claim: The current 365 day co-pay is, $0.  No PA needed at this time. This test claim was processed through [facility]- copay amounts may vary at other pharmacies due to pharmacy/plan contracts, or as the patient moves through the different stages of their insurance plan.     Only 1 kit is covered per year. Script needs to be written as a year supply

## 2023-08-23 ENCOUNTER — Telehealth: Payer: Self-pay

## 2023-08-23 NOTE — Telephone Encounter (Signed)
 Patient has pump supplies and needs to be scheduled.

## 2023-09-04 ENCOUNTER — Encounter: Attending: Internal Medicine | Admitting: Nutrition

## 2023-09-04 DIAGNOSIS — E1165 Type 2 diabetes mellitus with hyperglycemia: Secondary | ICD-10-CM | POA: Diagnosis present

## 2023-09-04 DIAGNOSIS — E1122 Type 2 diabetes mellitus with diabetic chronic kidney disease: Secondary | ICD-10-CM | POA: Diagnosis present

## 2023-09-04 DIAGNOSIS — Z794 Long term (current) use of insulin: Secondary | ICD-10-CM | POA: Insufficient documentation

## 2023-09-04 DIAGNOSIS — N1832 Chronic kidney disease, stage 3b: Secondary | ICD-10-CM | POA: Diagnosis present

## 2023-09-05 ENCOUNTER — Ambulatory Visit: Admitting: Internal Medicine

## 2023-09-05 ENCOUNTER — Telehealth: Payer: Self-pay | Admitting: Nutrition

## 2023-09-05 NOTE — Patient Instructions (Signed)
 Bolus for all meals and snacks Put blood sugar readings into all bolus calculations Do a correction dose for all blood sugars over 250' Read over starter kit booklet. Change pod every 3 days Call OmniPOd help line if questions about the pump Call Dexcom help line if questions/problems about the sensors.

## 2023-09-05 NOTE — Telephone Encounter (Signed)
 Patient reports that she had no difficulty giving bolus for supper last night but this morning she accidentally pulled out her dexcom.  She put a new one on, but did not link this to her PDM.  She was talked through doing this, and it was explained that she must do this to keep her pump in the automated mode, so that it will give her more insulin  when she is high, or stop the flow of insulin  when she is dropping low. She reported that her FBS was 85 this AM, but that this is rare, usually happening only once a month.   She had no questions for me at this time.

## 2023-09-05 NOTE — Progress Notes (Signed)
 Patient is here with her son to learn how to use the OmniPod 5 insulin  pump.  She did not bring her dash system, saying she ran out of pods earlier this morning, and did not want to put a new one on and loose all of that insulin .   Patient was trained on the use of the OmniPod 5 insulin  pump.  We discussed how this pump will deliver insulin , and the need to give a bolus for all meals and all snacks.  She reported good understanding of this.  Settings were put into the PDM per Dr. Kris last office note on orders:  Basal rate: 1.45u/hr., max basal rate: 2.90u/hr I/C: 1 (patient does not count carbs and is putting in 10u for all meals, not bolusing for snacks.).  She was told to continue taking what she has been taking, but to make sure she takes 1-2u for snacks.  She agreed to do this.)  ISF: 50, and target: 110 with correction over 110.   She filled a pod with Novolog  insulin  and attached the pod to her left abdomen.  We discussed proper placement of pods with reference to the position of her sensor.  The dexcom sensor was linked to the app, and to [facility],, and her app was linked to [employer].   She was shown how to bolus, and how to do correction doses, and re demonstrated these correctlyX2.  Her blood sugar was 103 at 11AM, saying she had just eaten 1 hour ago.  We discussed all topics on the training checklist and stressed the need to bolus for all meals and snacks, and to make sure that she puts the blood sugar readings into each bolus calculation.  She reported good understanding of this and had no final questions.  Her PDM was linked to [employer]: UN: [email or web address]  PW: [employer]

## 2023-09-15 ENCOUNTER — Other Ambulatory Visit: Payer: Self-pay

## 2023-09-15 MED ORDER — OMNIPOD 5 DEXG7G6 PODS GEN 5 MISC
3 refills | Status: AC
Start: 2023-09-15 — End: ?

## 2023-09-15 NOTE — Telephone Encounter (Signed)
 Omnipod have been sent to [facility] per patient request through VM.

## 2023-10-05 NOTE — Progress Notes (Unsigned)
 Name: Alexandria Meyer  MRN/ DOB: [id number], April 20, 1970   Age/ Sex: 53 y.o., female    PCP: Tann, Samandra, NP   Reason for Endocrinology Evaluation: Type 2 Diabetes Mellitus     Date of Initial Endocrinology Visit: 08/03/2020    PATIENT IDENTIFIER: Alexandria Meyer is a 53 y.o. female with a past medical history of T2DM and HN with hx of Pancreatitis ( while living in [place] ) . The patient presented for initial endocrinology clinic visit on 08/03/2020 for consultative assistance with her diabetes management.    HPI: [name] was   Diagnosed with DM at age 37 started with gestational diabetes  Prior Medications tried/Intolerance: was on Trulicity in 2009 , intolerant to metformin Hemoglobin A1c has ranged from 9.1% in 04/2020, peaking at 13.5% in 2018.  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  Repeat 24-hour urinary cortisol was normal at 9.6 mcg on 08/20/2020     SUBJECTIVE:   During the last visit (03/08/2023): A1c 8.1%      Today (10/06/23): Alexandria Meyer is here for follow-up on diabetes management.   She checks her blood sugars multiple times daily through CGM.    She was treated for acute respiratory illness in June, 2025 Denies  nausea or vomiting  No constipation or diarrhea   This patient with type 2 diabetes is treated with omnipod 5 (insulin  pump). During the visit the pump basal and bolus doses were reviewed including carb/insulin  rations and supplemental doses. The clinical list was updated. The glucose meter download was reviewed in detail to determine if the current pump  settings are providing the best glycemic control without excessive hypoglycemia.  Pump and meter download:    Pump   OmniPod Dash Settings   Insulin  type   NovoLog    Basal rate       0000 1.45              I:C ratio       0000 1:15    Entered 10 g with each meal              Sensitivity       0000  50      Goal       0000  100            Type & Model of Pump: OmniPod Dash Insulin  Type: Currently using NovoLog .  Body mass index is 37.49 kg/m.  PUMP STATISTICS: Unable to download   HOME DIABETES REGIMEN: Farxiga  10 mg daily  Wegovy through PCP Novolog      Statin: yes ACE-I/ARB: yes Prior Diabetic Education: no     CONTINUOUS GLUCOSE MONITORING RECORD INTERPRETATION : N/A        DIABETIC COMPLICATIO/NS: Microvascular complications:  Neuropathy Denies: CKD, retinopathy  Last eye exam: Completed 2022  Macrovascular complications:   Denies: CAD, PVD, CVA   PAST HISTORY: Past Medical History:  Past Medical History:  Diagnosis Date   Diabetes mellitus    Hypertension    Neuropathy in diabetes (HCC)    Osteoarthritis  Psoriasis    Psoriatic arthritis (HCC)    Sciatica    Scoliosis    Past Surgical History:  Past Surgical History:  Procedure Laterality Date   BACK SURGERY  2014   CESAREAN SECTION     TUBAL LIGATION      Social History:  reports that she has never smoked. She has never been exposed to tobacco smoke. She has never used smokeless tobacco. She reports that she does not drink alcohol and does not use drugs. Family History:  Family History  Problem Relation Age of Onset   Diabetes Mother    Diabetes Father      HOME MEDICATIONS: Allergies as of 10/06/2023       Reactions   Exenatide Nausea And Vomiting, Other (See Comments)   Pancreatitis history   Lisinopril Other (See Comments)   Cough, Pancreatitis   Penicillins Hives, Itching, Rash   Canagliflozin Other (See Comments)   Yeast infections   Metformin  Diarrhea, Nausea And Vomiting   Caused hospitalization        Medication List        Accurate as of October 06, 2023 10:59 AM. If you have any questions, ask your nurse or doctor.          PAUSE taking these medications    losartan  100 MG tablet Wait to take this until your doctor or other care provider tells you to start again. Commonly known as: COZAAR  Take 1 tablet by mouth daily.   metoprolol  succinate 100 MG 24 hr tablet Wait to take this until your doctor or other care provider tells you to start again. Commonly known as: TOPROL -XL Take 100 mg by mouth daily.   Omnipod 5 Libre2 Plus G6 Pods Misc Wait to take this until your doctor or other care provider tells you to start again. 1 Device by Does not apply route every other day. You also have another medication with the same name that you may need to continue taking.       TAKE these medications    albuterol  108 (90 Base) MCG/ACT inhaler Commonly known as: VENTOLIN  HFA Inhale 2 puffs into the lungs every 6 (six) hours as needed for wheezing.   B-D UF III MINI PEN NEEDLES 31G X 5 MM Misc Generic drug: Insulin  Pen Needle 1 Device by Does not apply route in the morning, at noon, in the evening, and at bedtime.   cetirizine 10 MG tablet Commonly known as: ZYRTEC Take 10 mg by mouth daily.   CORICIDIN HBP COLD/FLU PO Take 1 Dose by mouth as needed.   dapagliflozin  propanediol 10 MG Tabs tablet Commonly known as: FARXIGA  Take 1 tablet (10 mg total) by mouth daily.   ezetimibe  10 MG tablet Commonly known as: ZETIA  Take 10 mg by mouth daily.   famotidine  20 MG tablet Commonly known as: PEPCID  Take 20 mg by mouth 2 (two) times daily.   fluticasone 50 MCG/ACT nasal spray Commonly known as: FLONASE Place into both nostrils.   FreeStyle Libre 2 Plus Sensor Misc 1 Device by Does not apply route every 14 (fourteen) days.   Dexcom G7 Sensor Misc 1 Device by Does not apply route continuous. Change every 10  days   gabapentin  400 MG capsule Commonly known as: NEURONTIN  Take 1 capsule (400 mg total) by mouth 2 (two) times daily.   Humira  (2 Pen) 40 MG/0.4ML pen Generic drug: adalimumab  Inject 0.4 mLs (40 mg total) into the skin every 14 (fourteen) days. 1 kit -  2 pens   Lantus  SoloStar 100 UNIT/ML Solostar Pen Generic drug: insulin  glargine Inject 40 Units into the skin daily.   naloxone 4 MG/0.1ML Liqd nasal spray kit Commonly known as: NARCAN Place 4 mg into the nose as needed.   NovoLOG  FlexPen 100 UNIT/ML FlexPen Generic drug: insulin  aspart Inject 6 Units into the skin 3 (three) times daily with meals.   omeprazole 40 MG capsule Commonly known as: PRILOSEC Take 40 mg by mouth daily.   Omnipod 5 DexG7G6 Intro Gen 5 Kit Change pod every other day What changed: Another medication with the same name was paused. Ask your nurse or doctor if you should take this medication.   Omnipod 5 DexG7G6 Pods Gen 5 Misc Change pod every other day What changed: Another medication with the same name was paused. Ask your nurse or doctor if you should take this medication.   ondansetron  8 MG disintegrating tablet Commonly known as: ZOFRAN -ODT Take by mouth.   rosuvastatin  40 MG tablet Commonly known as: CRESTOR  Take 40 mg by mouth daily.   Senna-S 8.6-50 MG tablet Generic drug: senna-docusate Take 1 tablet by mouth at bedtime as needed for mild constipation.   Symbicort  80-4.5 MCG/ACT inhaler Generic drug: budesonide -formoterol  Inhale 2 puffs into the lungs in the morning and at bedtime.   traMADol  50 MG tablet Commonly known as: ULTRAM  Take 50 mg by mouth every 6 (six) hours as needed for moderate pain (pain score 4-6) (back pain).         ALLERGIES: Allergies  Allergen Reactions   Exenatide Nausea And Vomiting and Other (See Comments)    Pancreatitis history    Lisinopril Other (See Comments)    Cough, Pancreatitis    Penicillins Hives, Itching and Rash    Canagliflozin Other (See Comments)    Yeast infections   Metformin Diarrhea and Nausea And Vomiting    Caused hospitalization        OBJECTIVE:   VITAL SIGNS: BP 122/74 (BP Location: Left Arm, Patient Position: Sitting, Cuff Size: Normal)   Pulse 80   Ht 5' 2 (1.575 m)   Wt 205 lb (93 kg)   LMP 06/28/2016   SpO2 97%   BMI 37.49 kg/m    PHYSICAL EXAM:  General: Pt appears well and is in NAD  Lungs: Clear with good BS bilat   Heart: RRR   Extremities:  Lower extremities - no edema   Neuro: MS is good with appropriate affect, pt is alert and Ox3    Dm Foot Exam 03/08/2023 The skin of the feet is intact without sores or ulcerations. The pedal pulses are 2+ on right and 2+ on left. The sensation is intact to a screening 5.07, 10 gram monofilament bilaterally  DATA REVIEWED:  Lab Results  Component Value Date   HGBA1C 8.1 (A) 03/08/2023   HGBA1C 8.4 (A) 11/04/2022   HGBA1C 7.7 (A) 05/21/2021    Latest Reference Range & Units 03/08/23 12:29  Sodium 135 - 146 mmol/L 146  Potassium 3.5 - 5.3 mmol/L 4.3  Chloride 98 - 110 mmol/L 108  CO2 20 - 32 mmol/L 31  Glucose 65 - 99 mg/dL 892 (H)  BUN 7 - 25 mg/dL 33 (H)  Creatinine 9.49 - 1.03 mg/dL 8.64 (H)  Calcium  8.6 - 10.4 mg/dL 9.2  BUN/Creatinine Ratio 6 - 22 (calc) 24 (H)  Total CHOL/HDL Ratio <5.0 (calc) 2.6    Latest Reference Range & Units 03/08/23 12:29  Total CHOL/HDL Ratio <5.0 (calc) 2.6  Cholesterol <200 mg/dL 876  HDL Cholesterol > OR = 50 mg/dL 47 (L)  LDL Cholesterol (Calc) mg/dL (calc) 54  MICROALB/CREAT RATIO <30 mg/g creat 618 (H)  Non-HDL Cholesterol (Calc) <130 mg/dL (calc) 76  Triglycerides <150 mg/dL 860     Latest Reference Range & Units 03/08/23 12:29  TSH mIU/L 2.07    Latest Reference Range & Units 03/08/23 12:29  Microalb, Ur mg/dL 51.1  MICROALB/CREAT RATIO <30 mg/g creat 618 (H)  Creatinine, Urine 20 - 275 mg/dL 79  (H): Data is abnormally high   Old records , labs and images have  been reviewed.    ASSESSMENT / PLAN / RECOMMENDATIONS:   1) Type 2 Diabetes Mellitus, Sub-Optimally controlled, With CKD III , Neuropathic  complications - Most recent A1c of 7.9 %. Goal A1c < 7.0 %.    -A1c is trending down, but continues to be above goal - She is intolerant to metformin -Due to history of pancreatitis, GLP 1 agonist and DPP 4 inhibitors are CONTRAINDICATED, patient was started on Wegovy through her PCPs office in June, 2025.  I did explain to the patient that she is at risk for developing pancreatitis with GLP-1 agonist - I have encouraged the patient to continue to enter carbohydrates with each meal, 10 g for small carbohydrate content, 14 for large carbohydrate meal -She will enter 5 g with a snack - I have changed her sensitivity factor from 50 to 45   MEDICATIONS: Farxiga  10 mg daily Continue NovoLog     Pump   OmniPod Dash Settings   Insulin  type   NovoLog    Basal rate       0000 1.45              I:C ratio       0000 1:15    10-14 grams with each meal    5 g with a snack          Sensitivity       0000  45      Goal       0000  100         EDUCATION / INSTRUCTIONS: BG monitoring instructions: Patient is instructed to check her blood sugars 3 times a day, before meals . Call  Endocrinology clinic if: BG persistently < 70  I reviewed the Rule of 15 for the treatment of hypoglycemia in detail with the patient. Literature supplied.   2) Diabetic complications:  Eye: Does not have known diabetic retinopathy.  Neuro/ Feet: Does  have known diabetic peripheral neuropathy. Renal: Patient does not have known baseline CKD. She is on an ARB at present.   3) Dyslipidemia:   -LDL at goal in the past  Medication Continue rosuvastatin  40 mg daily Continue Zetia  10 mg daily   Follow-up in 6 months    Signed electronically by: Stefano Redgie Butts, MD  [facility]  [facility] [address] [place] [place], [place] [address] Phone: [phone] FAX: [phone]   CC: Macdonald Ping, NP [address] [place] [address] Phone: [phone]  Fax: [phone]    Return to Endocrinology clinic as below: Future Appointments  Date Time Provider Department Center  10/06/2023 11:50 AM Kameelah Minish, Donell Redgie, MD [id number] None

## 2023-10-06 ENCOUNTER — Ambulatory Visit: Admitting: Internal Medicine

## 2023-10-06 ENCOUNTER — Encounter: Payer: Self-pay | Admitting: Internal Medicine

## 2023-10-06 VITALS — BP 122/74 | HR 80 | Ht 62.0 in | Wt 205.0 lb

## 2023-10-06 DIAGNOSIS — E785 Hyperlipidemia, unspecified: Secondary | ICD-10-CM | POA: Diagnosis not present

## 2023-10-06 DIAGNOSIS — E1142 Type 2 diabetes mellitus with diabetic polyneuropathy: Secondary | ICD-10-CM | POA: Diagnosis not present

## 2023-10-06 DIAGNOSIS — E1165 Type 2 diabetes mellitus with hyperglycemia: Secondary | ICD-10-CM | POA: Diagnosis not present

## 2023-10-06 DIAGNOSIS — Z794 Long term (current) use of insulin: Secondary | ICD-10-CM

## 2023-10-06 DIAGNOSIS — N1832 Chronic kidney disease, stage 3b: Secondary | ICD-10-CM

## 2023-10-06 DIAGNOSIS — E1122 Type 2 diabetes mellitus with diabetic chronic kidney disease: Secondary | ICD-10-CM

## 2023-10-06 LAB — POCT GLYCOSYLATED HEMOGLOBIN (HGB A1C): Hemoglobin A1C: 7.9 % — AB (ref 4.0–5.6)

## 2023-10-06 MED ORDER — DAPAGLIFLOZIN PROPANEDIOL 10 MG PO TABS: 10.0000 mg | ORAL_TABLET | Freq: Every day | ORAL | 3 refills | Status: AC

## 2023-10-06 NOTE — Patient Instructions (Addendum)
 Enter between 10-14 g of carbohydrates with each meal Enter 5 g with snack   HOW TO TREAT LOW BLOOD SUGARS (Blood sugar LESS THAN 70 MG/DL) Please follow the RULE OF 15 for the treatment of hypoglycemia treatment (when your (blood sugars are less than 70 mg/dL)   STEP 1: Take 15 grams of carbohydrates when your blood sugar is low, which includes:  3-4 GLUCOSE TABS  OR 3-4 OZ OF JUICE OR REGULAR SODA OR ONE TUBE OF GLUCOSE GEL    STEP 2: RECHECK blood sugar in 15 MINUTES STEP 3: If your blood sugar is still low at the 15 minute recheck --> then, go back to STEP 1 and treat AGAIN with another 15 grams of carbohydrates.

## 2023-10-26 ENCOUNTER — Other Ambulatory Visit: Payer: Self-pay

## 2023-11-02 ENCOUNTER — Other Ambulatory Visit: Payer: Self-pay

## 2023-11-07 ENCOUNTER — Other Ambulatory Visit: Payer: Self-pay

## 2023-11-26 ENCOUNTER — Other Ambulatory Visit: Payer: Self-pay

## 2023-11-26 ENCOUNTER — Encounter (HOSPITAL_COMMUNITY): Payer: Self-pay

## 2023-11-26 ENCOUNTER — Emergency Department (HOSPITAL_COMMUNITY)
Admission: EM | Admit: 2023-11-26 | Discharge: 2023-11-26 | Disposition: A | Discharge status: Home / Self Care | Attending: Emergency Medicine | Admitting: Emergency Medicine

## 2023-11-26 DIAGNOSIS — M545 Low back pain, unspecified: Secondary | ICD-10-CM | POA: Insufficient documentation

## 2023-11-26 DIAGNOSIS — R109 Unspecified abdominal pain: Secondary | ICD-10-CM | POA: Insufficient documentation

## 2023-11-26 DIAGNOSIS — M546 Pain in thoracic spine: Secondary | ICD-10-CM | POA: Insufficient documentation

## 2023-11-26 DIAGNOSIS — R350 Frequency of micturition: Secondary | ICD-10-CM | POA: Diagnosis not present

## 2023-11-26 DIAGNOSIS — M549 Dorsalgia, unspecified: Secondary | ICD-10-CM

## 2023-11-26 LAB — COMPREHENSIVE METABOLIC PANEL WITH GFR
ALT: 86 U/L — ABNORMAL HIGH (ref 0–44)
AST: 34 U/L (ref 15–41)
Albumin: 4.2 g/dL (ref 3.5–5.0)
Alkaline Phosphatase: 98 U/L (ref 38–126)
Anion gap: 11 (ref 5–15)
BUN: 32 mg/dL — ABNORMAL HIGH (ref 6–20)
CO2: 30 mmol/L (ref 22–32)
Calcium: 10.4 mg/dL — ABNORMAL HIGH (ref 8.9–10.3)
Chloride: 101 mmol/L (ref 98–111)
Creatinine, Ser: 1.4 mg/dL — ABNORMAL HIGH (ref 0.44–1.00)
GFR, Estimated: 44 mL/min — ABNORMAL LOW (ref >60)
Glucose, Bld: 142 mg/dL — ABNORMAL HIGH (ref 70–99)
Potassium: 3.8 mmol/L (ref 3.5–5.1)
Sodium: 143 mmol/L (ref 135–145)
Total Bilirubin: 0.3 mg/dL (ref 0.0–1.2)
Total Protein: 7.7 g/dL (ref 6.5–8.1)

## 2023-11-26 LAB — URINALYSIS, ROUTINE W REFLEX MICROSCOPIC
Bilirubin Urine: NEGATIVE
Glucose, UA: >=500 mg/dL — AB
Hgb urine dipstick: NEGATIVE
Ketones, ur: NEGATIVE mg/dL
Leukocytes,Ua: NEGATIVE
Nitrite: NEGATIVE
Protein, ur: 100 mg/dL — AB
Specific Gravity, Urine: 1.018 (ref 1.005–1.030)
pH: 6 (ref 5.0–8.0)

## 2023-11-26 LAB — LIPASE, BLOOD: Lipase: 41 U/L (ref 11–51)

## 2023-11-26 LAB — CBC WITH DIFFERENTIAL/PLATELET
Abs Immature Granulocytes: 0.03 K/uL (ref 0.00–0.07)
Basophils Absolute: 0 K/uL (ref 0.0–0.1)
Basophils Relative: 0 %
Eosinophils Absolute: 0.1 K/uL (ref 0.0–0.5)
Eosinophils Relative: 2 %
HCT: 45.1 % (ref 36.0–46.0)
Hemoglobin: 13 g/dL (ref 12.0–15.0)
Immature Granulocytes: 0 %
Lymphocytes Relative: 41 %
Lymphs Abs: 3.1 K/uL (ref 0.7–4.0)
MCH: 24.6 pg — ABNORMAL LOW (ref 26.0–34.0)
MCHC: 28.8 g/dL — ABNORMAL LOW (ref 30.0–36.0)
MCV: 85.3 fL (ref 80.0–100.0)
Monocytes Absolute: 0.5 K/uL (ref 0.1–1.0)
Monocytes Relative: 7 %
Neutro Abs: 3.8 K/uL (ref 1.7–7.7)
Neutrophils Relative %: 50 %
Platelets: 216 K/uL (ref 150–400)
RBC: 5.29 MIL/uL — ABNORMAL HIGH (ref 3.87–5.11)
RDW: 15.6 % — ABNORMAL HIGH (ref 11.5–15.5)
WBC: 7.5 K/uL (ref 4.0–10.5)
nRBC: 0 % (ref 0.0–0.2)

## 2023-11-26 MED ORDER — NAPROXEN 500 MG PO TABS: 500.0000 mg | ORAL_TABLET | Freq: Two times a day (BID) | ORAL | 0 refills | Status: AC

## 2023-11-26 MED ORDER — MORPHINE SULFATE (PF) 4 MG/ML IV SOLN
4.0000 mg | Freq: Once | INTRAVENOUS | Status: AC
  Administered 2023-11-26: 4 mg via INTRAVENOUS
  Filled 2023-11-26: qty 1

## 2023-11-26 NOTE — Discharge Instructions (Addendum)
 You have been seen and discharged from the emergency department.  Your blood work was baseline.  Urinalysis had no blood and showed no infection.  You were given an IV dose of pain medicine.  Continue to take your home prescribed pain medicine.  I have added naproxen for further relief.  Take as directed.  Follow-up with your primary provider for further evaluation and further care. Take home medications as prescribed. If you have any worsening symptoms or further concerns for your health please return to an emergency department for further evaluation.

## 2023-11-26 NOTE — ED Provider Notes (Signed)
 [place] EMERGENCY DEPARTMENT AT [facility] Provider Note   CSN: [id number] Arrival date & time: 11/26/23  2031     Patient presents with: Flank Pain   Alexandria Meyer is a 53 y.o. female.   HPI   52 year old female presents emergency department with left-sided back pain.  Patient states that this has been going on for the past 2 days.  Originally started in the left lower back and has radiated up to her left upper back.  It sometimes wraps around underneath her left breast.  Pain is worse with certain movement and palpation.  Denies any history of aortic aneurysm or kidney stones.  Has had urinary frequency but denies any dysuria, hematuria.  No chest pain, shortness of breath.  Prior to Admission medications   Medication Sig Start Date End Date Taking? Authorizing Provider  adalimumab  (HUMIRA , 2 PEN,) 40 MG/0.4ML pen Inject 0.4 mLs (40 mg total) into the skin every 14 (fourteen) days. 1 kit - 2 pens 01/27/23   Jeannetta Lonni ORN, MD  albuterol  (VENTOLIN  HFA) 108 (90 Base) MCG/ACT inhaler Inhale 2 puffs into the lungs every 6 (six) hours as needed for wheezing.    [provider]  budesonide -formoterol  (SYMBICORT ) 80-4.5 MCG/ACT inhaler Inhale 2 puffs into the lungs in the morning and at bedtime. 07/11/23   Harrie Lonni, DO  cetirizine (ZYRTEC) 10 MG tablet Take 10 mg by mouth daily.    [provider]  Chlorpheniramine-Acetaminophen  (CORICIDIN HBP COLD/FLU PO) Take 1 Dose by mouth as needed.    [provider]  Continuous Glucose Sensor (DEXCOM G7 SENSOR) MISC 1 Device by Does not apply route continuous. Change every 10 days 08/01/23   Shamleffer, Ibtehal Jaralla, MD  Continuous Glucose Sensor (FREESTYLE LIBRE 2 PLUS SENSOR) MISC 1 Device by Does not apply route every 14 (fourteen) days. 03/08/23   Shamleffer, Ibtehal Jaralla, MD  dapagliflozin  propanediol (FARXIGA ) 10 MG TABS tablet Take 1 tablet (10 mg total) by mouth daily. 10/06/23    Shamleffer, Ibtehal Jaralla, MD  ezetimibe  (ZETIA ) 10 MG tablet Take 10 mg by mouth daily. 10/27/22   [provider]  famotidine  (PEPCID ) 20 MG tablet Take 20 mg by mouth 2 (two) times daily. 06/28/23   [provider]  fluticasone (FLONASE) 50 MCG/ACT nasal spray Place into both nostrils. 11/01/22   [provider]  gabapentin  (NEURONTIN ) 400 MG capsule Take 1 capsule (400 mg total) by mouth 2 (two) times daily. 07/11/23   Harrie Lonni, DO  insulin  aspart (NOVOLOG  FLEXPEN) 100 UNIT/ML FlexPen Inject 6 Units into the skin 3 (three) times daily with meals. 07/11/23   Harrie Lonni, DO  Insulin  Disposable Pump (OMNIPOD 5 DEXG7G6 INTRO GEN 5) KIT Change pod every other day 08/18/23   Shamleffer, Ibtehal Jaralla, MD  Insulin  Disposable Pump (OMNIPOD 5 DEXG7G6 PODS GEN 5) MISC Change pod every other day 09/15/23   Shamleffer, Ibtehal Jaralla, MD  Insulin  Disposable Pump (OMNIPOD 5 LIBRE2 PLUS G6 PODS) MISC 1 Device by Does not apply route every other day. 03/08/23   Shamleffer, Donell Cardinal, MD  Insulin  Pen Needle (B-D UF III MINI PEN NEEDLES) 31G X 5 MM MISC 1 Device by Does not apply route in the morning, at noon, in the evening, and at bedtime. 08/03/20   Shamleffer, Ibtehal Jaralla, MD  LANTUS  SOLOSTAR 100 UNIT/ML Solostar Pen Inject 40 Units into the skin daily. 07/11/23   Harrie Lonni, DO  losartan  (COZAAR ) 100 MG tablet Take 1 tablet by mouth  daily. 06/17/20   [provider]  metoprolol  succinate (TOPROL -XL) 100 MG 24 hr tablet Take 100 mg by mouth daily. 12/13/21   [provider]  naloxone [facility]) nasal spray 4 mg/0.1 mL Place 4 mg into the nose as needed. 01/15/20   [provider]  omeprazole (PRILOSEC) 40 MG capsule Take 40 mg by mouth daily. 06/16/20   [provider]  ondansetron  (ZOFRAN -ODT) 8 MG disintegrating tablet Take by mouth. 06/28/23   [provider]  rosuvastatin  (CRESTOR ) 40 MG tablet Take 40 mg by  mouth daily. 06/16/20   [provider]  senna-docusate (SENOKOT-S) 8.6-50 MG tablet Take 1 tablet by mouth at bedtime as needed for mild constipation. 07/11/23   Harrie Bruckner, DO  traMADol  (ULTRAM ) 50 MG tablet Take 50 mg by mouth every 6 (six) hours as needed for moderate pain (pain score 4-6) (back pain).    [provider]    Allergies: Exenatide, Lisinopril, Penicillins, Canagliflozin, and Metformin    Review of Systems  Constitutional:  Positive for fatigue. Negative for fever.  Respiratory:  Negative for shortness of breath.   Cardiovascular:  Negative for chest pain.  Gastrointestinal:  Negative for abdominal pain, diarrhea and vomiting.  Genitourinary:  Positive for frequency. Negative for dysuria.  Musculoskeletal:  Positive for back pain.  Skin:  Negative for rash.  Neurological:  Negative for headaches.    Updated Vital Signs BP (!) 168/80   Pulse 83   Temp 98.7 F (37.1 C) (Oral)   Resp 18   LMP 06/28/2016   SpO2 95%   Physical Exam Vitals and nursing note reviewed.  Constitutional:      General: She is not in acute distress.    Appearance: Normal appearance. She is not ill-appearing.  HENT:     Head: Normocephalic.     Mouth/Throat:     Mouth: Mucous membranes are moist.  Cardiovascular:     Rate and Rhythm: Normal rate.  Pulmonary:     Effort: Pulmonary effort is normal. No respiratory distress.  Abdominal:     Palpations: Abdomen is soft.     Tenderness: There is no abdominal tenderness. There is no guarding or rebound.  Musculoskeletal:     Comments: Reproducible tenderness to palpation of the left lumbar muscles extending all the way up to under the left scapula.  No overlying skin changes, worse with movement.  Skin:    General: Skin is warm.  Neurological:     Mental Status: She is alert and oriented to person, place, and time. Mental status is at baseline.  Psychiatric:        Mood and Affect: Mood normal.     (all labs  ordered are listed, but only abnormal results are displayed) Labs Reviewed  COMPREHENSIVE METABOLIC PANEL WITH GFR - Abnormal; Notable for the following components:      Result Value   Glucose, Bld 142 (*)    BUN 32 (*)    Creatinine, Ser 1.40 (*)    Calcium  10.4 (*)    ALT 86 (*)    GFR, Estimated 44 (*)    All other components within normal limits  CBC WITH DIFFERENTIAL/PLATELET - Abnormal; Notable for the following components:   RBC 5.29 (*)    MCH 24.6 (*)    MCHC 28.8 (*)    RDW 15.6 (*)    All other components within normal limits  URINALYSIS, ROUTINE W REFLEX MICROSCOPIC - Abnormal; Notable for the following components:   Color,  Urine STRAW (*)    Glucose, UA >=500 (*)    Protein, ur 100 (*)    Bacteria, UA RARE (*)    All other components within normal limits  LIPASE, BLOOD    EKG: None  Radiology: No results found.   Procedures   Medications Ordered in the ED  morphine  (PF) 4 MG/ML injection 4 mg (4 mg Intravenous Given 11/26/23 2204)                                    Medical Decision Making Amount and/or Complexity of Data Reviewed Labs: ordered.  Risk Prescription drug management.   53 year old female presents emergency department with 2 days of a left lower back pain that is extending up to the left lateral back and sometimes around the left breast.  Otherwise no acute symptoms.  Pain is worse with palpation and movement.  No known injury/trauma.  Vitals are normal and stable.  She has no chest pain, shortness of breath or abdominal tenderness.  Have low suspicion for intrathoracic or intra-abdominal pathology.  Her pain is very reproducible on exam and with certain movements.  Blood work is reassuring and normal for the patient, urinalysis shows no blood, lower suspicion for kidney stone at this time.  After medication patient states that her pain is almost completely resolved.  She is well-appearing, stable vitals.  Will plan for outpatient  follow-up.  Patient at this time appears safe and stable for discharge and close outpatient follow up. Discharge plan and strict return to ED precautions discussed, patient verbalizes understanding and agreement.     Final diagnoses:  None    ED Discharge Orders     None          Bari Roxie HERO, DO 11/26/23 2346

## 2023-11-26 NOTE — ED Triage Notes (Signed)
 Pt reports left side flank pain for the last two days that radiates up back. Pt reports odor to urine. Denies emesis.

## 2023-12-01 ENCOUNTER — Other Ambulatory Visit: Payer: Self-pay | Admitting: Internal Medicine

## 2023-12-07 ENCOUNTER — Other Ambulatory Visit: Payer: Self-pay

## 2023-12-07 NOTE — Progress Notes (Signed)
 Specialty Medication not filled since 05/09/23, patient has deiced not to continue, dis-enrolling patient.

## 2024-04-11 ENCOUNTER — Ambulatory Visit: Admitting: Internal Medicine
